# Patient Record
Sex: Male | Born: 1955 | Race: Black or African American | Hispanic: No | Marital: Single | Smoking: Former smoker
Health system: Southern US, Community
[De-identification: ages and names within clinical notes are randomized; demographics above are authoritative.]

## PROBLEM LIST (undated history)

## (undated) DIAGNOSIS — E785 Hyperlipidemia, unspecified: Secondary | ICD-10-CM

## (undated) DIAGNOSIS — Z982 Presence of cerebrospinal fluid drainage device: Secondary | ICD-10-CM

## (undated) DIAGNOSIS — J310 Chronic rhinitis: Secondary | ICD-10-CM

## (undated) DIAGNOSIS — R1310 Dysphagia, unspecified: Secondary | ICD-10-CM

## (undated) DIAGNOSIS — I639 Cerebral infarction, unspecified: Secondary | ICD-10-CM

## (undated) DIAGNOSIS — E039 Hypothyroidism, unspecified: Secondary | ICD-10-CM

## (undated) DIAGNOSIS — E46 Unspecified protein-calorie malnutrition: Secondary | ICD-10-CM

## (undated) DIAGNOSIS — Z8739 Personal history of other diseases of the musculoskeletal system and connective tissue: Secondary | ICD-10-CM

## (undated) DIAGNOSIS — I629 Nontraumatic intracranial hemorrhage, unspecified: Secondary | ICD-10-CM

## (undated) HISTORY — PX: VENTRICULOPERITONEAL SHUNT: SHX204

---

## 1994-05-13 DIAGNOSIS — I629 Nontraumatic intracranial hemorrhage, unspecified: Secondary | ICD-10-CM

## 1994-05-13 HISTORY — DX: Nontraumatic intracranial hemorrhage, unspecified: I62.9

## 2006-01-02 ENCOUNTER — Emergency Department (HOSPITAL_COMMUNITY): Admission: EM | Admit: 2006-01-02 | Discharge: 2006-01-02 | Payer: Self-pay | Admitting: Family Medicine

## 2006-01-09 ENCOUNTER — Ambulatory Visit: Payer: Self-pay | Admitting: Family Medicine

## 2006-06-21 ENCOUNTER — Emergency Department (HOSPITAL_COMMUNITY): Admission: EM | Admit: 2006-06-21 | Discharge: 2006-06-21 | Payer: Self-pay | Admitting: Family Medicine

## 2007-10-16 ENCOUNTER — Encounter: Admission: RE | Admit: 2007-10-16 | Discharge: 2007-10-16 | Payer: Self-pay | Admitting: Family Medicine

## 2009-12-12 ENCOUNTER — Encounter: Admission: RE | Admit: 2009-12-12 | Discharge: 2009-12-12 | Payer: Self-pay | Admitting: Neurology

## 2010-01-19 ENCOUNTER — Inpatient Hospital Stay (HOSPITAL_COMMUNITY): Admission: RE | Admit: 2010-01-19 | Discharge: 2010-01-21 | Payer: Self-pay | Admitting: Neurosurgery

## 2010-02-16 ENCOUNTER — Encounter: Admission: RE | Admit: 2010-02-16 | Discharge: 2010-02-16 | Payer: Self-pay | Admitting: Neurosurgery

## 2010-03-01 ENCOUNTER — Encounter
Admission: RE | Admit: 2010-03-01 | Discharge: 2010-05-02 | Payer: Self-pay | Source: Home / Self Care | Attending: Neurosurgery | Admitting: Neurosurgery

## 2010-05-21 ENCOUNTER — Encounter
Admission: RE | Admit: 2010-05-21 | Discharge: 2010-06-12 | Payer: Self-pay | Source: Home / Self Care | Attending: Neurosurgery | Admitting: Neurosurgery

## 2010-07-26 LAB — SURGICAL PCR SCREEN
MRSA, PCR: NEGATIVE
Staphylococcus aureus: NEGATIVE

## 2010-07-26 LAB — BASIC METABOLIC PANEL
CO2: 29 mEq/L (ref 19–32)
Calcium: 9.9 mg/dL (ref 8.4–10.5)
GFR calc Af Amer: >60 mL/min (ref >60)
GFR calc non Af Amer: >60 mL/min (ref >60)
Potassium: 4.8 mEq/L (ref 3.5–5.1)
Sodium: 139 mEq/L (ref 135–145)

## 2010-07-26 LAB — CBC
Hemoglobin: 11.7 g/dL — ABNORMAL LOW (ref 13.0–17.0)
RBC: 3.95 MIL/uL — ABNORMAL LOW (ref 4.22–5.81)

## 2010-09-11 ENCOUNTER — Emergency Department (HOSPITAL_COMMUNITY): Payer: Medicaid Other

## 2010-09-11 ENCOUNTER — Inpatient Hospital Stay (HOSPITAL_COMMUNITY)
Admission: EM | Admit: 2010-09-11 | Discharge: 2010-09-18 | DRG: 177 | Disposition: A | Discharge status: Rehabilitation Facility | Payer: Medicaid Other | Source: Ambulatory Visit | Attending: Internal Medicine | Admitting: Internal Medicine

## 2010-09-11 DIAGNOSIS — E039 Hypothyroidism, unspecified: Secondary | ICD-10-CM | POA: Diagnosis present

## 2010-09-11 DIAGNOSIS — Z880 Allergy status to penicillin: Secondary | ICD-10-CM

## 2010-09-11 DIAGNOSIS — Z87891 Personal history of nicotine dependence: Secondary | ICD-10-CM

## 2010-09-11 DIAGNOSIS — I509 Heart failure, unspecified: Secondary | ICD-10-CM | POA: Diagnosis present

## 2010-09-11 DIAGNOSIS — G8929 Other chronic pain: Secondary | ICD-10-CM | POA: Diagnosis present

## 2010-09-11 DIAGNOSIS — R578 Other shock: Secondary | ICD-10-CM | POA: Diagnosis present

## 2010-09-11 DIAGNOSIS — Z982 Presence of cerebrospinal fluid drainage device: Secondary | ICD-10-CM

## 2010-09-11 DIAGNOSIS — F1410 Cocaine abuse, uncomplicated: Secondary | ICD-10-CM | POA: Diagnosis present

## 2010-09-11 DIAGNOSIS — D649 Anemia, unspecified: Secondary | ICD-10-CM | POA: Diagnosis present

## 2010-09-11 DIAGNOSIS — N179 Acute kidney failure, unspecified: Secondary | ICD-10-CM | POA: Diagnosis present

## 2010-09-11 DIAGNOSIS — A088 Other specified intestinal infections: Secondary | ICD-10-CM | POA: Diagnosis present

## 2010-09-11 DIAGNOSIS — R748 Abnormal levels of other serum enzymes: Secondary | ICD-10-CM | POA: Diagnosis present

## 2010-09-11 DIAGNOSIS — E43 Unspecified severe protein-calorie malnutrition: Secondary | ICD-10-CM | POA: Diagnosis present

## 2010-09-11 DIAGNOSIS — R5381 Other malaise: Secondary | ICD-10-CM | POA: Diagnosis present

## 2010-09-11 DIAGNOSIS — I5033 Acute on chronic diastolic (congestive) heart failure: Secondary | ICD-10-CM | POA: Diagnosis not present

## 2010-09-11 DIAGNOSIS — J690 Pneumonitis due to inhalation of food and vomit: Principal | ICD-10-CM | POA: Diagnosis present

## 2010-09-11 DIAGNOSIS — I69959 Hemiplegia and hemiparesis following unspecified cerebrovascular disease affecting unspecified side: Secondary | ICD-10-CM

## 2010-09-11 HISTORY — PX: PEG TUBE PLACEMENT: SUR1034

## 2010-09-11 LAB — RAPID URINE DRUG SCREEN, HOSP PERFORMED
Amphetamines: NOT DETECTED
Cocaine: NOT DETECTED
Opiates: NOT DETECTED
Tetrahydrocannabinol: NOT DETECTED

## 2010-09-11 LAB — URINALYSIS, ROUTINE W REFLEX MICROSCOPIC
Glucose, UA: NEGATIVE mg/dL
Ketones, ur: NEGATIVE mg/dL
Leukocytes, UA: NEGATIVE
Nitrite: NEGATIVE
Protein, ur: 100 mg/dL — AB
pH: 5 (ref 5.0–8.0)

## 2010-09-11 LAB — COMPREHENSIVE METABOLIC PANEL
Albumin: 3.5 g/dL (ref 3.5–5.2)
Alkaline Phosphatase: 142 U/L — ABNORMAL HIGH (ref 39–117)
BUN: 34 mg/dL — ABNORMAL HIGH (ref 6–23)
CO2: 18 mEq/L — ABNORMAL LOW (ref 19–32)
Chloride: 101 mEq/L (ref 96–112)
Creatinine, Ser: 3.43 mg/dL — ABNORMAL HIGH (ref 0.4–1.5)
GFR calc Af Amer: 23 mL/min — ABNORMAL LOW (ref >60)
Glucose, Bld: 104 mg/dL — ABNORMAL HIGH (ref 70–99)
Sodium: 138 mEq/L (ref 135–145)
Total Bilirubin: 1.6 mg/dL — ABNORMAL HIGH (ref 0.3–1.2)
Total Protein: 8.2 g/dL (ref 6.0–8.3)

## 2010-09-11 LAB — TROPONIN I: Troponin I: <0.3 ng/mL (ref <0.30)

## 2010-09-11 LAB — URINE MICROSCOPIC-ADD ON

## 2010-09-11 LAB — CBC
Hemoglobin: 11.9 g/dL — ABNORMAL LOW (ref 13.0–17.0)
MCHC: 34.8 g/dL (ref 30.0–36.0)
RBC: 4.03 MIL/uL — ABNORMAL LOW (ref 4.22–5.81)
WBC: 14.4 10*3/uL — ABNORMAL HIGH (ref 4.0–10.5)

## 2010-09-11 LAB — LIPASE, BLOOD: Lipase: 20 U/L (ref 11–59)

## 2010-09-11 LAB — DIFFERENTIAL
Basophils Absolute: 0.1 10*3/uL (ref 0.0–0.1)
Eosinophils Absolute: 0.3 10*3/uL (ref 0.0–0.7)
Lymphocytes Relative: 10 % — ABNORMAL LOW (ref 12–46)
Neutrophils Relative %: 80 % — ABNORMAL HIGH (ref 43–77)
WBC Morphology: INCREASED

## 2010-09-11 LAB — GLUCOSE, CAPILLARY: Glucose-Capillary: 77 mg/dL (ref 70–99)

## 2010-09-11 LAB — PROCALCITONIN: Procalcitonin: 85.83 ng/mL

## 2010-09-12 ENCOUNTER — Inpatient Hospital Stay (HOSPITAL_COMMUNITY): Payer: Medicaid Other

## 2010-09-12 ENCOUNTER — Other Ambulatory Visit (HOSPITAL_COMMUNITY): Payer: Medicaid Other

## 2010-09-12 DIAGNOSIS — I10 Essential (primary) hypertension: Secondary | ICD-10-CM

## 2010-09-12 DIAGNOSIS — R7989 Other specified abnormal findings of blood chemistry: Secondary | ICD-10-CM

## 2010-09-12 DIAGNOSIS — I059 Rheumatic mitral valve disease, unspecified: Secondary | ICD-10-CM

## 2010-09-12 LAB — HEPATIC FUNCTION PANEL
Albumin: 2.8 g/dL — ABNORMAL LOW (ref 3.5–5.2)
Alkaline Phosphatase: 117 U/L (ref 39–117)
Indirect Bilirubin: 0.5 mg/dL (ref 0.3–0.9)
Total Protein: 6.6 g/dL (ref 6.0–8.3)

## 2010-09-12 LAB — BLOOD GAS, ARTERIAL
Bicarbonate: 18.4 mEq/L — ABNORMAL LOW (ref 20.0–24.0)
Bicarbonate: 18.7 mEq/L — ABNORMAL LOW (ref 20.0–24.0)
FIO2: 0.21 %
O2 Saturation: 95.4 %
Patient temperature: 98.6
Patient temperature: 102.3
TCO2: 19.4 mmol/L (ref 0–100)
pCO2 arterial: 32.8 mmHg — ABNORMAL LOW (ref 35.0–45.0)
pH, Arterial: 7.385 (ref 7.350–7.450)
pO2, Arterial: 82.7 mmHg (ref 80.0–100.0)
pO2, Arterial: 65.3 mmHg — ABNORMAL LOW (ref 80.0–100.0)

## 2010-09-12 LAB — BASIC METABOLIC PANEL
CO2: 19 mEq/L (ref 19–32)
Chloride: 111 mEq/L (ref 96–112)
GFR calc Af Amer: 41 mL/min — ABNORMAL LOW (ref >60)
Potassium: 3.9 mEq/L (ref 3.5–5.1)

## 2010-09-12 LAB — CARDIAC PANEL(CRET KIN+CKTOT+MB+TROPI)
Relative Index: 0.9 (ref 0.0–2.5)
Total CK: 1359 U/L — ABNORMAL HIGH (ref 7–232)
Troponin I: 0.35 ng/mL (ref <0.30)

## 2010-09-12 LAB — CBC
HCT: 28.5 % — ABNORMAL LOW (ref 39.0–52.0)
Hemoglobin: 9.8 g/dL — ABNORMAL LOW (ref 13.0–17.0)
MCH: 29 pg (ref 26.0–34.0)
MCHC: 34.4 g/dL (ref 30.0–36.0)
RBC: 3.38 MIL/uL — ABNORMAL LOW (ref 4.22–5.81)

## 2010-09-12 LAB — INFLUENZA PANEL BY PCR (TYPE A & B)
H1N1 flu by pcr: NOT DETECTED
Influenza A By PCR: NEGATIVE

## 2010-09-12 LAB — GLUCOSE, CAPILLARY
Glucose-Capillary: 95 mg/dL (ref 70–99)
Glucose-Capillary: 88 mg/dL (ref 70–99)
Glucose-Capillary: 76 mg/dL (ref 70–99)
Glucose-Capillary: 81 mg/dL (ref 70–99)
Glucose-Capillary: 77 mg/dL (ref 70–99)

## 2010-09-12 LAB — CLOSTRIDIUM DIFFICILE BY PCR: Toxigenic C. Difficile by PCR: NEGATIVE

## 2010-09-12 LAB — STREP PNEUMONIAE URINARY ANTIGEN: Strep Pneumo Urinary Antigen: NEGATIVE

## 2010-09-12 LAB — HEPATITIS PANEL, ACUTE
HCV Ab: NEGATIVE
Hep A IgM: NEGATIVE
Hepatitis B Surface Ag: NEGATIVE

## 2010-09-12 LAB — PRO B NATRIURETIC PEPTIDE: Pro B Natriuretic peptide (BNP): 2090 pg/mL — ABNORMAL HIGH (ref 0–125)

## 2010-09-12 LAB — LACTATE DEHYDROGENASE: LDH: 204 U/L (ref 94–250)

## 2010-09-12 LAB — MAGNESIUM: Magnesium: 2.4 mg/dL (ref 1.5–2.5)

## 2010-09-12 LAB — URINE CULTURE: Culture: NO GROWTH

## 2010-09-12 LAB — LACTIC ACID, PLASMA: Lactic Acid, Venous: 1.1 mmol/L (ref 0.5–2.2)

## 2010-09-12 LAB — GIARDIA/CRYPTOSPORIDIUM SCREEN(EIA): Cryptosporidium Screen (EIA): NEGATIVE

## 2010-09-12 LAB — HIV ANTIBODY (ROUTINE TESTING W REFLEX): HIV: NONREACTIVE

## 2010-09-12 LAB — LEGIONELLA ANTIGEN, URINE

## 2010-09-13 DIAGNOSIS — I509 Heart failure, unspecified: Secondary | ICD-10-CM

## 2010-09-13 LAB — GLUCOSE, CAPILLARY

## 2010-09-13 LAB — COMPREHENSIVE METABOLIC PANEL
ALT: 50 U/L (ref 0–53)
AST: 43 U/L — ABNORMAL HIGH (ref 0–37)
Albumin: 2.5 g/dL — ABNORMAL LOW (ref 3.5–5.2)
Chloride: 110 mEq/L (ref 96–112)
Creatinine, Ser: 1.3 mg/dL (ref 0.4–1.5)
GFR calc Af Amer: >60 mL/min (ref >60)
Potassium: 3.8 mEq/L (ref 3.5–5.1)
Sodium: 140 mEq/L (ref 135–145)
Total Bilirubin: 0.3 mg/dL (ref 0.3–1.2)

## 2010-09-13 LAB — CBC
Hemoglobin: 9.1 g/dL — ABNORMAL LOW (ref 13.0–17.0)
MCH: 28.8 pg (ref 26.0–34.0)
Platelets: 292 10*3/uL (ref 150–400)
RBC: 3.16 MIL/uL — ABNORMAL LOW (ref 4.22–5.81)
WBC: 16.2 10*3/uL — ABNORMAL HIGH (ref 4.0–10.5)

## 2010-09-14 LAB — DIFFERENTIAL
Eosinophils Relative: 5 % (ref 0–5)
Lymphocytes Relative: 13 % (ref 12–46)
Lymphs Abs: 1.7 10*3/uL (ref 0.7–4.0)
Neutrophils Relative %: 68 % (ref 43–77)

## 2010-09-14 LAB — COMPREHENSIVE METABOLIC PANEL
ALT: 46 U/L (ref 0–53)
Alkaline Phosphatase: 204 U/L — ABNORMAL HIGH (ref 39–117)
CO2: 25 mEq/L (ref 19–32)
GFR calc non Af Amer: >60 mL/min (ref >60)
Glucose, Bld: 82 mg/dL (ref 70–99)
Potassium: 3.3 mEq/L — ABNORMAL LOW (ref 3.5–5.1)
Sodium: 141 mEq/L (ref 135–145)

## 2010-09-14 LAB — CBC
HCT: 27.6 % — ABNORMAL LOW (ref 39.0–52.0)
MCV: 84.4 fL (ref 78.0–100.0)
RBC: 3.27 MIL/uL — ABNORMAL LOW (ref 4.22–5.81)
WBC: 13.1 10*3/uL — ABNORMAL HIGH (ref 4.0–10.5)

## 2010-09-15 LAB — GLUCOSE, CAPILLARY: Glucose-Capillary: 75 mg/dL (ref 70–99)

## 2010-09-15 LAB — STOOL CULTURE

## 2010-09-16 LAB — GLUCOSE, CAPILLARY
Glucose-Capillary: 85 mg/dL (ref 70–99)
Glucose-Capillary: 83 mg/dL (ref 70–99)

## 2010-09-16 LAB — BASIC METABOLIC PANEL
BUN: 12 mg/dL (ref 6–23)
CO2: 29 mEq/L (ref 19–32)
Chloride: 100 mEq/L (ref 96–112)
Creatinine, Ser: 0.88 mg/dL (ref 0.4–1.5)
GFR calc Af Amer: >60 mL/min (ref >60)

## 2010-09-17 DIAGNOSIS — J159 Unspecified bacterial pneumonia: Secondary | ICD-10-CM

## 2010-09-17 DIAGNOSIS — R5381 Other malaise: Secondary | ICD-10-CM

## 2010-09-17 DIAGNOSIS — R1310 Dysphagia, unspecified: Secondary | ICD-10-CM

## 2010-09-18 ENCOUNTER — Inpatient Hospital Stay (HOSPITAL_COMMUNITY): Payer: Medicaid Other

## 2010-09-18 ENCOUNTER — Inpatient Hospital Stay (HOSPITAL_COMMUNITY)
Admission: RE | Admit: 2010-09-18 | Discharge: 2010-10-02 | DRG: 945 | Disposition: A | Discharge status: Home Health | Payer: Medicaid Other | Source: Other Acute Inpatient Hospital | Attending: Physical Medicine & Rehabilitation | Admitting: Physical Medicine & Rehabilitation

## 2010-09-18 DIAGNOSIS — E8779 Other fluid overload: Secondary | ICD-10-CM

## 2010-09-18 DIAGNOSIS — R5381 Other malaise: Secondary | ICD-10-CM

## 2010-09-18 DIAGNOSIS — G8929 Other chronic pain: Secondary | ICD-10-CM

## 2010-09-18 DIAGNOSIS — Z982 Presence of cerebrospinal fluid drainage device: Secondary | ICD-10-CM

## 2010-09-18 DIAGNOSIS — E440 Moderate protein-calorie malnutrition: Secondary | ICD-10-CM

## 2010-09-18 DIAGNOSIS — I69959 Hemiplegia and hemiparesis following unspecified cerebrovascular disease affecting unspecified side: Secondary | ICD-10-CM

## 2010-09-18 DIAGNOSIS — J159 Unspecified bacterial pneumonia: Secondary | ICD-10-CM

## 2010-09-18 DIAGNOSIS — N179 Acute kidney failure, unspecified: Secondary | ICD-10-CM

## 2010-09-18 DIAGNOSIS — E039 Hypothyroidism, unspecified: Secondary | ICD-10-CM

## 2010-09-18 DIAGNOSIS — A419 Sepsis, unspecified organism: Secondary | ICD-10-CM

## 2010-09-18 DIAGNOSIS — J690 Pneumonitis due to inhalation of food and vomit: Secondary | ICD-10-CM

## 2010-09-18 DIAGNOSIS — Z880 Allergy status to penicillin: Secondary | ICD-10-CM

## 2010-09-18 DIAGNOSIS — R1310 Dysphagia, unspecified: Secondary | ICD-10-CM

## 2010-09-18 DIAGNOSIS — G4700 Insomnia, unspecified: Secondary | ICD-10-CM

## 2010-09-18 DIAGNOSIS — E162 Hypoglycemia, unspecified: Secondary | ICD-10-CM

## 2010-09-18 DIAGNOSIS — Z5189 Encounter for other specified aftercare: Principal | ICD-10-CM

## 2010-09-18 DIAGNOSIS — Z681 Body mass index (BMI) 19 or less, adult: Secondary | ICD-10-CM

## 2010-09-18 DIAGNOSIS — Z833 Family history of diabetes mellitus: Secondary | ICD-10-CM

## 2010-09-18 LAB — CBC
HCT: 34 % — ABNORMAL LOW (ref 39.0–52.0)
MCV: 85.2 fL (ref 78.0–100.0)
RBC: 3.99 MIL/uL — ABNORMAL LOW (ref 4.22–5.81)
WBC: 13.3 10*3/uL — ABNORMAL HIGH (ref 4.0–10.5)

## 2010-09-18 LAB — CULTURE, BLOOD (ROUTINE X 2)
Culture  Setup Time: 201205020552
Culture: NO GROWTH

## 2010-09-18 LAB — GLUCOSE, CAPILLARY: Glucose-Capillary: 92 mg/dL (ref 70–99)

## 2010-09-19 DIAGNOSIS — R5381 Other malaise: Secondary | ICD-10-CM

## 2010-09-19 DIAGNOSIS — J159 Unspecified bacterial pneumonia: Secondary | ICD-10-CM

## 2010-09-19 LAB — DIFFERENTIAL
Basophils Absolute: 0.1 10*3/uL (ref 0.0–0.1)
Basophils Relative: 0 % (ref 0–1)
Neutro Abs: 8.2 10*3/uL — ABNORMAL HIGH (ref 1.7–7.7)
Neutrophils Relative %: 71 % (ref 43–77)

## 2010-09-19 LAB — CBC
Hemoglobin: 11 g/dL — ABNORMAL LOW (ref 13.0–17.0)
RBC: 3.79 MIL/uL — ABNORMAL LOW (ref 4.22–5.81)

## 2010-09-19 LAB — COMPREHENSIVE METABOLIC PANEL
ALT: 23 U/L (ref 0–53)
AST: 23 U/L (ref 0–37)
CO2: 31 mEq/L (ref 19–32)
Calcium: 9.7 mg/dL (ref 8.4–10.5)
Chloride: 97 mEq/L (ref 96–112)
GFR calc Af Amer: >60 mL/min (ref >60)
GFR calc non Af Amer: >60 mL/min (ref >60)
Sodium: 136 mEq/L (ref 135–145)

## 2010-09-21 ENCOUNTER — Inpatient Hospital Stay (HOSPITAL_COMMUNITY): Payer: Medicaid Other

## 2010-09-21 DIAGNOSIS — R5381 Other malaise: Secondary | ICD-10-CM

## 2010-09-21 DIAGNOSIS — I69919 Unspecified symptoms and signs involving cognitive functions following unspecified cerebrovascular disease: Secondary | ICD-10-CM

## 2010-09-21 DIAGNOSIS — J159 Unspecified bacterial pneumonia: Secondary | ICD-10-CM

## 2010-09-21 DIAGNOSIS — I69991 Dysphagia following unspecified cerebrovascular disease: Secondary | ICD-10-CM

## 2010-09-23 ENCOUNTER — Inpatient Hospital Stay (HOSPITAL_COMMUNITY): Payer: Medicaid Other

## 2010-09-23 LAB — GLUCOSE, CAPILLARY: Glucose-Capillary: 79 mg/dL (ref 70–99)

## 2010-09-23 NOTE — H&P (Signed)
[name], [name]               ACCOUNT NO.:  [id number]  MEDICAL RECORD NO.:  [id number]           PATIENT TYPE:  E  LOCATION:  [facility]                         FACILITY:  [facility]  PHYSICIAN:  [name], MDDATE OF BIRTH:  09-14-1955  DATE OF ADMISSION:  09/11/2010 DATE OF DISCHARGE:                             HISTORY & PHYSICAL   PRIMARY CARE PHYSICIAN:  Roy D. Pecola Leisure, MD  CHIEF COMPLAINT:  Nausea, vomiting, and diarrhea.  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  Initially, the patient came to the ER, the patient was having a blood pressure of 80/60, tachycardic.  The patient has received almost 2 liters of fluid.  At this time, blood pressure is around 110 systolic.  The patient is symptomatic and been admitted for further workup.  As per the patient's aunt who provided history, the patient has not been doing well since Saturday.  He goes to enrichment center due to his disability on weekdays.  On Friday, he had one of his mates who has nausea, vomiting and diarrhea.  The patient started feeling unwell from Saturday.  He became more lethargic and stumbling.  On Sunday, he had multiple episode of nausea, vomiting and diarrhea.  On Monday morning, he was found on the floor in the bathroom by his aunt.  At that time, they had called the primary care physician who advised to get Kaopectate for his diarrhea which may be causing the problem.  By the evening, he started having cough with productive sputum and  fever or chills and today morning, his condition further worsened and the family decided to bring to the ER.  At this time, a chest x-ray does show infiltrates in the right lower and upper lobe concerning for aspiration.  The patient has been admitted for pneumonia and acute renal failure and nausea, vomiting, and diarrhea probably from gastroenteritis.  The patient denies any chest pain or shortness of breath at this time. Denies any headache or visual symptoms.  Denies any new focal deficit. Denies any abdominal pain, dysuria, or discharges.  He has had multiple episode of diarrhea in the ER, which is looking greenish in color.  PAST MEDICAL HISTORY: 1. History of intracranial hemorrhage in 9096 after using cocaine,     which left the patient with left-sided hemiparesis and partial left-     sided blindness and left facial asymmetry, had required VP shunt     which was revised last year due to malfunction. 2. History of hypothyroidism. 3. History of polysubstance abuse including cigarette smoking,     drinking and cocaine and heroin abuse which he has stopped for     years ago as per the patient's aunt.  Medications prior to admission which has to be verified includes levothyroxine, meloxicam, and trazodone.  ALLERGIES:  IBUPROFEN, MOTRIN and PENICILLIN.  FAMILY HISTORY:  Nothing contributory.  SOCIAL HISTORY:  The patient used to smoke cigarette, drinking alcohol and abuse cocaine and heroin which he has stopped using for 5 years as per family.  REVIEW OF SYSTEMS:  As per the history of presenting illness, nothing else significant.  PHYSICAL EXAMINATION:  GENERAL:  The patient examined at the bedside not in acute distress. VITAL SIGNS:  Blood pressure 110/60, pulse 90 per minute, temperature 98.1, respirations 18 per minute, O2 sat 100%. HEENT:  Anicteric.  There is mild left-sided asymmetry, which family says it is chronic.  The patient at this time is able to see in  both eyes, though he has history of left-sided partial blindness.  No pallor. NECK:  No neck rigidity.  No discharge from ears, eyes, nose, or mouth. CHEST:  Bilateral entry present.  No rhonchi.  No crepitation. HEART:  S1-S2 heard. ABDOMEN:  Soft, nontender.  Bowel sounds heard. CNS:  The patient is alert, awake, oriented to time, place, and person. He is able to move upper and lower extremities.  His left upper extremity is around 3/5 strength.  His left lower extremity is almost normal strength at this time.  Right upper and lower extremity he is able to move normally. EXTREMITIES:  Peripheral pulses felt.  No acute ischemic changes, cyanosis, or clubbing.  I do not see any hematoma or skin discoloration at this time.  LABORATORY STUDIES:  EKG shows normal sinus rhythm with sinus tachycardia, beats around 110 beats per minute with T-wave inversion which is also present on the old EKG.  Acute abdominal series shows right upper lobe and lower lung pneumonia.  This may represent aspiration pneumonia given the clinical history of vomiting.  The VP shunt catheter achieving appears grossly intact.  Soft tissue x-ray shows there is some redundant shunt tubing in the neck potentially related to prior revision of VP shunt.  CT of head without contrast shows no acute intracranial abnormalities, stable ventriculomegaly and ventriculostomy position, old right basal ganglia and lacunar, and right frontal lobe white matter infarct.  CBC; WBC is 14.4, hemoglobin is 11.9, hematocrit is 34.2, platelets 408, neutrophils 80%.  Complete metabolic panel; sodium 138, potassium 4.4, chloride 101, carbon dioxide is 18, anion gap is 19, glucose 104, BUN 34, creatinine 3.4, total bilirubin 1.6, alk phos 142, AST 87, ALT 91, albumin 3.5, calcium 10.5. UA appears amber cloudy, negative for nitrite leukocyte shows granular and hyaline cast.  Cultures pending.  ASSESSMENT: 1. Nausea, vomiting, and diarrhea  probably from gastroenteritis. 2. Acute renal failure from dehydration. 3. Pneumonia, possible aspiration. 4. History of intracranial hemorrhage and stroke, status post VP shunt     placement with left-sided hemiparesis. 5. History of hypothyroidism. 6. History of polysubstance abuse. 7. Anemia.  PLAN: 1. At this time, I will admit the patient to telemetry. 2. For his nausea, vomiting, and diarrhea with acute renal failure, I     think at this time it is probably from gastroenteritis as the     patient's one of mates in enrichment center had a similar symptoms     last Friday.  We will check stool studies.  We will aggressively     hydrate the patient.  At this time, the patient has responded to     fluids.  His blood pressure has improved to 110-120.  Tachycardia  is decreased.  We will follow strict intake and output, daily     weights and closely follow his creatinine and potassium. 3. For his pneumonia, which at this time, I think it is probably     aspiration.  We will be continuing with clindamycin. 4. For his fevers, we are going to get blood cultures, urine cultures,     and stool studies. 5. Does show mild anemia, we will check anemia profile. 6. There is some septal EKG changes, which are present in the old EKG.     We will just check a cardiac enzyme of this time.  The patient does     not have any chest pain. 7. Mildly elevated LFTs.  We will repeat LFTs again in the morning.     His LFTs may be increase due to his hypotension.  Given his     symptoms, we will check acute hepatitis panel.  We will also check     Tylenol levels.  We are going to check lactic acid and     procalcitonin level and further recommendation as condition     evolves.     Roy Clos, MD     ANK/MEDQ  D:  09/11/2010  T:  09/11/2010  Job:  [id number]  cc:   Jocelyn Lamer D. Pecola [name], M.D.  Electronically Signed by Midge Minium MD on 09/23/2010 08:36:53 AM

## 2010-09-24 ENCOUNTER — Inpatient Hospital Stay (HOSPITAL_COMMUNITY): Payer: Medicaid Other

## 2010-09-24 DIAGNOSIS — R5381 Other malaise: Secondary | ICD-10-CM

## 2010-09-24 DIAGNOSIS — J159 Unspecified bacterial pneumonia: Secondary | ICD-10-CM

## 2010-09-24 DIAGNOSIS — I69919 Unspecified symptoms and signs involving cognitive functions following unspecified cerebrovascular disease: Secondary | ICD-10-CM

## 2010-09-24 LAB — BASIC METABOLIC PANEL
BUN: 17 mg/dL (ref 6–23)
Chloride: 102 mEq/L (ref 96–112)
Potassium: 4.5 mEq/L (ref 3.5–5.1)
Sodium: 137 mEq/L (ref 135–145)

## 2010-09-24 LAB — GLUCOSE, CAPILLARY
Glucose-Capillary: 94 mg/dL (ref 70–99)
Glucose-Capillary: 54 mg/dL — ABNORMAL LOW (ref 70–99)
Glucose-Capillary: 72 mg/dL (ref 70–99)

## 2010-09-25 LAB — GLUCOSE, CAPILLARY
Glucose-Capillary: 65 mg/dL — ABNORMAL LOW (ref 70–99)
Glucose-Capillary: 67 mg/dL — ABNORMAL LOW (ref 70–99)
Glucose-Capillary: 75 mg/dL (ref 70–99)
Glucose-Capillary: 79 mg/dL (ref 70–99)
Glucose-Capillary: 95 mg/dL (ref 70–99)

## 2010-09-26 ENCOUNTER — Inpatient Hospital Stay (HOSPITAL_COMMUNITY): Payer: Medicaid Other

## 2010-09-26 LAB — GLUCOSE, CAPILLARY
Glucose-Capillary: 101 mg/dL — ABNORMAL HIGH (ref 70–99)
Glucose-Capillary: 104 mg/dL — ABNORMAL HIGH (ref 70–99)
Glucose-Capillary: 70 mg/dL (ref 70–99)
Glucose-Capillary: 85 mg/dL (ref 70–99)
Glucose-Capillary: 86 mg/dL (ref 70–99)

## 2010-09-26 NOTE — Consult Note (Signed)
NAMEGEOVANI, TOOTLE               ACCOUNT NO.:  [id number]  MEDICAL RECORD NO.:  [id number]           PATIENT TYPE:  I  LOCATION:  2610                         FACILITY:  [facility]  PHYSICIAN:  Colleen Can. Deborah Chalk, M.D.DATE OF BIRTH:  [birth year]  DATE OF CONSULTATION:  09/12/2010 DATE OF DISCHARGE:                                CONSULTATION   PRIMARY CARE PHYSICIAN:  Betti D. Pecola Leisure, MD  PRIMARY CARDIOLOGIST:  None.  CHIEF COMPLAINT:  Elevated cardiac enzymes and an elevated BNP.  HISTORY OF PRESENT ILLNESS:  Mr. Donaway is a 55 year old male with no previous cardiac history.  He was admitted last p.m. with nausea, vomiting, and diarrhea.  He had near syncope.  He was noted to have pneumonia, possibly secondary to aspiration and acute renal insufficiency as well as anemia.  His cardiac enzymes were checked and were elevated.  A BNP was elevated as well and his EKG has some new abnormalities on it.  So, Cardiology was asked to evaluate him.  Mr. Rosevear is awake and alert.  He speaks slowly, but is understandable. He answers questions appropriately.  He denies chest pain and states he has never had it.  His shortness of breath is improved.  He complains of lower extremity pain bilaterally secondary to the SCDs, and also complains of hunger.  He has no other complaints at this time.  His activity is limited because of chronic medical problems, but he has no history of exertional symptoms.  PAST MEDICAL HISTORY: 1. History of an intracranial hemorrhage in 1996.  At that time, he     was using cocaine resulting in left hemiparesis. 2. Chronic pain issues. 3. History of polysubstance abuse with heroin, cocaine, alcohol and     tobacco, but none in 5 years. 4. Hypothyroidism. 5. History of anemia.  SURGICAL HISTORY:  He is status post right frontal ventriculoperitoneal shunt with replacement in 2011.  ALLERGIES:  PENICILLIN and IBUPROFEN.  CURRENT MEDICATIONS: 1. Zithromax. 2.  Rocephin. 3. Cleocin. 4. IV fluids.  SOCIAL HISTORY:  He lives in [place] with family.  He is disabled. He quit alcohol, tobacco, and drugs 5 years ago.  FAMILY HISTORY:  There is no coronary artery disease known in either parent or any siblings.  REVIEW OF SYSTEMS:  He is partially blind.  He has chronic left-sided hemiparesis.  He complains of arthralgias and joint pains.  He came in with shortness of breath and presyncope, but this has improved with hydration.  He denies reflux symptoms or melena.  Full 14-point review of systems is otherwise negative except as stated in the HPI.  PHYSICAL EXAMINATION:  VITAL SIGNS:  Temperature is 100.2, blood pressure 105/56, pulse 88, respiratory rate 20, O2 saturation 100% on 4 liters. GENERAL:  He is a slender African American male in no acute distress at rest. HEENT:  His head is normocephalic and atraumatic.  Extraocular movements are intact.  Sclerae are clear.  Nares are without discharge. Oropharynx is without erythema. NECK:  There is no lymphadenopathy, thyromegaly, bruit, or JVD noted. CV:  His heart is regular in rate and rhythm with an S1-S2  and no significant murmur, rub, or gallop is noted.  Distal pulses are 2+ in all four extremities. LUNGS:  He has rales bilaterally, right greater than left. SKIN:  No rashes or lesions are noted. ABDOMEN:  Soft and nontender with active bowel sounds. EXTREMITIES:  There is no cyanosis, clubbing, or edema noted. MUSCULOSKELETAL:  There is no joint deformity or effusions. NEUROLOGIC:  He is alert and oriented with chronic weakness on the left side.  Chest x-ray on Sep 11, 2010 showed pneumonia in the right upper lobe and the right lower lobe, but no edema.  Chest x-ray on Sep 12, 2010 shows worsening right upper lobe with now right middle lobe and right lower lobe airspace disease.  He also has new left medial airspace disease, edema unlikely.  EKG; sinus rhythm, sinus tachycardia,  rate 100 with diffuse inferolateral T-wave changes of unclear significance.  LABORATORY VALUES:  PH 7.377, pCO2 32, pO2 82.7, bicarb 18.4. Hemoglobin 9.8, hematocrit 28.5, WBCs 11.5, platelets 313.  Sodium 141, potassium 3.9, chloride 111, CO2 19, BUN 30, creatinine 2.04, glucose 90.  AST 57, ALT 64, albumin 2.8, other CMET values within normal limits.  BNP 2090, CK-MB 1334/13.6, then 1359/11.7 with a troponin-I of less than 0.3 and then 0.35.  IMPRESSION:  Mr. Depaulo was seen today by Dr. Deborah Chalk, the patient evaluated and the data reviewed.  He is a 55 year old male with no previous cardiac issues who is here with elevated cardiac enzymes and an elevated BNP as well as abnormal EKG.  A 2-D echo has been performed. We suspect that increased cardiac enzymes are related more to sepsis and generalized toxicity.  Preliminary review of the echocardiogram shows a normal left ventricle with an ejection fraction of 55-60%, mild mitral regurgitation and tricuspid regurgitation and a PAS of 30-35 mmHg.  Our recommendation therefore is to treat the underlying pneumonia and illness.  We think the cardiac enzymes and BNP are related to general toxicity and do not indicate a primary cardiac event.     Theodore Demark, PA-C   ______________________________ Colleen Can. Deborah Chalk, M.D.    RB/MEDQ  D:  09/12/2010  T:  09/13/2010  Job:  [id number]  Electronically Signed by Theodore Demark PA-C on 09/20/2010 08:02:17 PM Electronically Signed by Roger Shelter M.D. on 09/26/2010 06:06:10 PM

## 2010-09-27 LAB — GLUCOSE, CAPILLARY
Glucose-Capillary: 82 mg/dL (ref 70–99)
Glucose-Capillary: 86 mg/dL (ref 70–99)
Glucose-Capillary: 90 mg/dL (ref 70–99)

## 2010-09-28 ENCOUNTER — Inpatient Hospital Stay (HOSPITAL_COMMUNITY): Payer: Medicaid Other

## 2010-09-28 LAB — BASIC METABOLIC PANEL
BUN: 15 mg/dL (ref 6–23)
Calcium: 9.4 mg/dL (ref 8.4–10.5)
GFR calc Af Amer: >60 mL/min (ref >60)
GFR calc non Af Amer: >60 mL/min (ref >60)
Glucose, Bld: 79 mg/dL (ref 70–99)
Potassium: 4.5 mEq/L (ref 3.5–5.1)

## 2010-09-28 LAB — CBC
Hemoglobin: 10.2 g/dL — ABNORMAL LOW (ref 13.0–17.0)
MCH: 28.4 pg (ref 26.0–34.0)
RBC: 3.59 MIL/uL — ABNORMAL LOW (ref 4.22–5.81)

## 2010-09-28 LAB — PROTIME-INR: INR: 1.15 (ref 0.00–1.49)

## 2010-09-28 MED ORDER — IOHEXOL 300 MG/ML  SOLN
50.0000 mL | Freq: Once | INTRAMUSCULAR | Status: AC | PRN
  Administered 2010-09-28: 5 mL via INTRAVENOUS

## 2010-09-29 DIAGNOSIS — J159 Unspecified bacterial pneumonia: Secondary | ICD-10-CM

## 2010-09-29 DIAGNOSIS — R5381 Other malaise: Secondary | ICD-10-CM

## 2010-09-29 DIAGNOSIS — I69919 Unspecified symptoms and signs involving cognitive functions following unspecified cerebrovascular disease: Secondary | ICD-10-CM

## 2010-10-01 DIAGNOSIS — J159 Unspecified bacterial pneumonia: Secondary | ICD-10-CM

## 2010-10-01 DIAGNOSIS — R5381 Other malaise: Secondary | ICD-10-CM

## 2010-10-01 DIAGNOSIS — I69919 Unspecified symptoms and signs involving cognitive functions following unspecified cerebrovascular disease: Secondary | ICD-10-CM

## 2010-10-02 LAB — CBC
HCT: 31.3 % — ABNORMAL LOW (ref 39.0–52.0)
MCHC: 33.2 g/dL (ref 30.0–36.0)
Platelets: 380 10*3/uL (ref 150–400)
RDW: 13.7 % (ref 11.5–15.5)
WBC: 6.7 10*3/uL (ref 4.0–10.5)

## 2010-10-02 LAB — BASIC METABOLIC PANEL
Calcium: 9.4 mg/dL (ref 8.4–10.5)
GFR calc Af Amer: >60 mL/min (ref >60)
GFR calc non Af Amer: >60 mL/min (ref >60)
Potassium: 4.9 mEq/L (ref 3.5–5.1)
Sodium: 140 mEq/L (ref 135–145)

## 2010-10-06 NOTE — Discharge Summary (Signed)
[name], [name]               ACCOUNT NO.:  [id number]  MEDICAL RECORD NO.:  [id number]           PATIENT TYPE:  I  LOCATION:  6742                         FACILITY:  [facility]  PHYSICIAN:  Lonia Blood, M.D.       DATE OF BIRTH:  [birth year]  DATE OF ADMISSION:  09/11/2010 DATE OF DISCHARGE:  09/18/2010                              DISCHARGE SUMMARY   DISCHARGE DIAGNOSES: 1. Aspiration pneumonia, pneumonitis. 2. Acute renal failure secondary to nausea, vomiting, diarrhea and     dehydration. 3. Nausea, vomiting, diarrhea resolved/stabilized.  The patient has     one diarrheal bowel movement daily.  This is chronic and ongoing. 4. Elevated cardiac enzymes felt to be secondary to general illness.     No cardiac interventions planned. 5. History of intracranial bleeding/cerebrovascular disease due to     cocaine abuse.  No acute issues. 6. History of hydrocephalus status post VP shunt. No acute issues. 7. Normocytic anemia. 8. Diastolic congestive heart failure with volume overload, resolved. 9. Severe protein calorie malnutrition.  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  He has a VP shunt and had a replacement last year due to malfunction.  He also has a previous history of hypothyroidism, polysubstance, and chronic pain.  He was brought to the ED on May 1 secondary to persistent nausea, vomiting, diarrhea with presyncope.  Initially, the patient came to the ER having a blood pressure of 80/60.  He was tachycardic.  After receiving fluids, his blood pressure stabilized at about 110 systolic.  He was admitted to the hospital for further observation and treatment.  Overnight, his cardiac enzymes returned elevated specifically creatinine kinase 1359, CK-MB 13.6, troponin-I 0.35 and BNP  2090.  He was transferred to the step-down unit.  His aspiration pneumonia was treated with empiric antibiotics including azithromycin, Rocephin and clindamycin after a couple of days as the patient improved, the azithromycin and Rocephin were discontinued and he was maintained on clindamycin for a total of 8 days.  The patient had acute renal failure on admission with a BUN of 34 and creatinine 3.43.  Over the course of this hospitalization with IV fluids, his BUN has dropped to 12 and his creatinine is 0.88.  His acute renal failure has resolved.  His nausea, vomiting and diarrhea was secondary to unclear etiology, could have been simply viral gastroenteritis.  The patient was C. diff negative.  Stool cultures were found to be negative as well.  This improve substantially with hydration.  However, he continues to have 1 episode of diarrhea daily.  He tells me this is his normal bowel habits.  Secondary to his elevated cardiac enzymes, the patient was seen in consultation by Cardiology on Sep 12, 2010, by Dr. Roger Shelter notes that the patient had no previous cardiac issues. His elevated cardiac enzymes and elevated BNP and abnormal EKG were likely related to his sepsis and generalized toxicity.  He had  a 2-D echo during his hospitalization, which demonstrated an ejection fraction of 55-60%, mild mitral regurgitation and tricuspid regurgitation with a PAF of 30-35.  Dr. Lesle Chris recommended that the underlying pneumonia and illness be treated there was no indication for further cardiac workup. The patient continued to improve over the course of hospitalization; however, both the patient and his aunt noted that he was substantially weaker now and he was 6 months ago.  Consequently, a consult for inpatient rehab was requested.  Inpatient rehab evaluated the patient and found that he was a suitable candidate for their services. Consequently, he is being discharged today to inpatient  rehab.  PHYSICAL EXAMINATION:  GENERAL:  At time of discharge, the patient is alert and oriented.  His demeanor is pleasant, cooperative.  He is sitting up in the chair.  He denies any nausea or vomiting today.  He has had one bowel movements. VITAL SIGNS:  Temperature 98.0, pulse 79, respirations 18, blood pressure 105/74. HEENT:  Head is atraumatic normocephalic.  He has a healed scar from his VP shunt. HEART:  Regular rate and rhythm without murmurs, rubs or gallops. ABDOMEN: Soft, nontender, nondistended with good bowel sounds. CHEST:  Decreased sounds of air movement.  However, he has no wheezes, crackles or rales. EXTREMITIES:  Lower extremities demonstrate no edema. PSYCHIATRIC:  The patient is alert and oriented.  His demeanor is pleasant, cooperative.  Grooming is good.  PERTINENT LABORATORY DATA:  During this hospitalization, include his white count which had a peak with 16.2, now at time of discharge is 13.3, hemoglobin at discharge 11.5, hematocrit 34.0, platelets 616.  As mentioned, he had acute renal failure on admission with a BUN of 34, creatinine 3.43.  As of May 6, his BUN and creatinine have normalized at 12 and 0.88 respectively.  Cardiac enzymes peaked out at a CK of 13, 59, CK-MB 13.6 and troponin of 0.35 on Sep 14, 2010.  He had a BNP of 10,917. Acute hepatitis panel was drawn and found to be negative.  HIV was drawn found to be nonreactive.  Urine drug screen was negative.  Stool cultures were negative C. diff PCR was negative.  Urine Legionella antigen was negative.  Blood cultures were drawn and after 5 days there was no growth.  Urine cultures were collected and after 5 days there was no growth.  IMAGING ON ADMISSION:  The patient had a CT of his head that showed no acute intracranial abnormality, stable ventriculomegaly and ventricular colostomy physician.  He has an old right basal ganglia lacunes and right frontal lobe matter infarct.  X-ray of his neck  showed some redundant shunt tubing in the neck potentially related to prior revision of the VP shunt.  X-ray of abdomen showed right upper lobe and lower lung lobe pneumonia.  This may represent aspiration pneumonia given clinical history of vomiting.  Followup portable chest x-ray done May 2 showed worsening bilateral right greater than left airspace disease. Followup x-ray on May 8 is pending.  ANCILLARY STUDIES:  On Sep 12, 2010, the patient had a 2-D echo, which showed an LVEF of 55-60% wall motion was normal.  Mitral valve was found to be mildly thickened.  Otherwise, a 2-D echo was within normal limits. EKG on an Sep 12, 2010.  Indicated T-wave abnormalities, possible inferior ischemia, prolonged QT.  DISCHARGE MEDICATIONS: 1. Acetaminophen 325 mg 1-2 tablets by mouth every 4 hours as needed     for pain. 2. Bisacodyl 10 mg suppository rectally as  needed for constipation. 3. Benadryl 2.5 mg/mL 12.5 to 25 mg by mouth every 6 hours as needed. 4. Guaifenesin D 100 5-10 mL by mouth every 6 hours as needed for     cough. 5. Maalox suspension 30 mL by mouth every 4 hours as needed. 6. Levothyroxine 75 mcg 1 tablet by mouth every morning. 7. Meloxicam 15 mg 1 tablet by mouth q.8 h. as needed for pain. 8. Multivitamin over the counter 1 tablet daily. 9. Trazodone 50 mg 3 tablets by mouth daily at bedtime.  DISCHARGE INSTRUCTIONS:  The patient is to go to inpatient rehab at [facility] and will be followed there.  At the time of discharge, he will follow up with his primary care physician within 2 weeks.     Stephani Police, PA   ______________________________ Lonia Blood, M.D.    MLY/MEDQ  D:  09/18/2010  T:  09/19/2010  Job:  [id number]  Electronically Signed by Algis Downs PA on 10/01/2010 03:37:43 PM Electronically Signed by Lonia Blood M.D. on 10/06/2010 08:16:55 AM

## 2010-10-10 NOTE — Discharge Summary (Signed)
NAMEQUANTA, [name]               ACCOUNT NO.:  [id number]  MEDICAL RECORD NO.:  [id number]           PATIENT TYPE:  I  LOCATION:  4151                         FACILITY:  [facility]  PHYSICIAN:  Erick Colace, M.D.DATE OF BIRTH:  1955-09-03  DATE OF ADMISSION:  09/18/2010 DATE OF DISCHARGE:  10/02/2010                              DISCHARGE SUMMARY   DISCHARGE DIAGNOSES: 1. Deconditioning/pneumonia and history of intracerebral hemorrhage. 2. Dysphagia. 3. Right lower lobe pneumonia, treated. 4. Hypothyroid.  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  KUB and chest x-ray done past admission revealed right upper lobe and right lower lobe pneumonia, likely due to aspiration and due to nausea and vomiting.  UDS negative.  BNP at admission was elevated at 2090.  The patient with hypotension at admission with BP at 86/65 and elevated cardiac enzymes.  Tw-D echo done showed EF of 55-60%.  Cardiology evaluated the patient and felt elevated enzymes due to sepsis.  The patient has been treated with diuretics as well as IV antibiotics.  It was changed to clindamycin on Sep 15, 2010. Speech Therapy evaluated the patient and recommended regular diet as without evidence of aspiration.  Therapy evaluation was done and the patient is noted to have decrease in posture with right limb and increased tone in right lower extremity.  The patient was evaluated by Rehab on Sep 17, 2010, and we felt that he would benefit from [facility] program.  PAST MEDICAL HISTORY:  Significant for hypothyroid, history of polysubstance abuse.  No alcohol or drugs x5 years.  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  REVIEW OF SYMPTOMS:  Positive for weakness as well as decreased vision in left eye.  ALLERGIES: 1. IBUPROFEN. 2. PENICILLIN.  FAMILY HISTORY:  Positive for diabetes mellitus.  SOCIAL HISTORY:  The patient lives with aunt and uncle, in 2-level home with 6 steps at entry.  Bedroom on first floor.  Does not use any alcohol.  Quit tobacco in 2011.  No drugs x5 years.  FUNCTIONAL HISTORY:  The patient was independent with rolling walker prior to admission.  He attends adult day program 4 hours 5 days a week.  FUNCTIONAL STATUS:  The patient is total assist for toileting, setup to supervision for upper body care and lower body care.  Min to mod assist for transfers, min assist ambulating 70 feet with rolling walker.  PHYSICAL EXAMINATION:  VITAL SIGNS:  Blood pressure 128/74, pulse 70, respiratory rate 16, and temperature 98.0. GENERAL:  The patient is pleasant, thin male, alert, no acute distress. HEENT:  Pupils are equal, round, and reactive to light.  Oral mucosa is pink and moist with multiple missing teeth.  Left temporal area with evidence of shunt tubing. NECK:  Supple without JVD or lymphadenopathy. LUNGS:  Noted to have some rales on his right base. HEART:  Regular rate and rhythm without murmurs or gallops. EXTREMITIES:  No evidence of clubbing, cyanosis, or edema. ABDOMEN:  Soft, nontender with positive bowel sounds. SKIN:  No evidence of bruising or breakdown noted. NEUROLOGIC:  Cranial nerves II-XII grossly intact except for some mild left facial weakness and dysarthric speech.  Sensory exam grossly normal.  Reflexes are 1+.  Judgment, orientation, and memory are limited to very basic information.  Mood is extremely flat.  The patient is cooperative and pleasant overall.  Strength is 4-5/5 in bilateral upper extremities.  Strength is 3+/5 to 4/5 proximally to hip with flexion, 4/5 in knees and ankles bilaterally.  No resting tone or  hyperreflexia noted.  HOSPITAL COURSE:  [name] was admitted to Rehab on Sep 18, 2010, for inpatient therapies to consist of PT/OT at least 3 hours 5 days a week.  Past admission. Physiatrist, rehab RN, and therapy team have worked together to provide customized collaborative interdisciplinary care.  The patient was noted to have problems at breakfast and noted to be coughing with evidence of aspiration and therefore speech therapy was added for evaluation and treatment during this stay.  The patient's blood pressure were monitored on a b.i.d. basis and these are noted to be ranging from 90s-110s systolic, 50s-60s diastolic.  The patient was maintained on clindamycin for 14 total days of antibiotic therapy.  Followup chest x-ray was done on Sep 18, 2010, showing significantly improved multilobular right lower lobe pneumonia. Labs done past admission revealed H and H at 11.2 and 32.4, white count of 11.5.  Check of lytes showed low albumin at 3.0.  Speech Therapy evaluated the patient for dysphagia.  His diet was downgraded, exited, and then MBS was done on Sep 21, 2010, revealing primary anatomical pharyngeal dysphagia marked by C5-C6 bony spur-like growth impeding anteriorly into pharyngeal space at the level of UES causing residue with eventual spillage into vestibular and then into the trachea. Giving chronic nature of the patient's bone spurs, the patient was likely able to compensate prior to his illness and deconditioning. N.p.o. status was recommended and Speech Therapy feels the patient will be able to progress to return to p.o. in the future.  On talking to the patient's family members, they relayed the patient having chronic problems with choking frequently with meals as well as problems handling liquids.  PEG tube was recommended for nutritional support.  This was done in Radiology on Sep 28, 2010.  Additionally, followup chest x-ray was done on Sep 22, 2010, showing  further interval improvement in airway opacities with improving right upper lobe and right lower lobe pneumonia.  Most recent CBC of Oct 02, 2010, reveals hemoglobin 10.4, hematocrit 31.3, white count 6.7, and platelets 380.  Lytes at the time of discharge revealed sodium 140, potassium 4.9, chloride 105, CO2 of 29, BUN 14, creatinine 0.75, and glucose 91.  CBGs were checked q.4 h. during this stay.  The patient was noted to have no evidence of hyperglycemia with tube feeds. Blood sugars have been in 50s to 90s range. As blood sugars are overall stable, CBG checks were discontinued.  Currently, the patient's gastrostomy tube site is clean, dry, and intact.  Tube feedings were initiated and the patient was noted to have some issues with diarrhea on Jevity.  He was changed over Vital on the day of discharge.  The family education was done with the patient's aunt regarding tube feeds administration  as well as administration via Panda. Family has been educated regarding strict n.p.o. status.  Speech Therapy has educated the patient's aunt regarding swallow dysfunction, its chronicity and potential for returns.  Outpatient MBS recommended in 2 weeks.  They have also advised family regarding strict oral care and allowing 1 teaspoon of ice chips a day to prevent muscle disuse atrophy. During the patient's stay in the Rehab, weekly team conferences were held to monitor the patient's progress, set goals as well as discuss barriers to discharge.  At admission, the patient was noted to be deconditioned overall with decreased balance and abnormal posture with increased trunk, hip, and knee flexion.  He was at min assist for transfers, min assist for ambulating 60 feet with a rolling walker with postural abnormality and tone when supine.  Physical Therapy has been working with the patient on sitting balance as well as improving truncal extension and upright standing posture.  The patient is currently  at supervision level for transfers.  He is min assist for ambulating short distances.  He is able to navigate 15 stairs with 2 rails with min assist with initial loss of balance, but improved steadiness with ongoing steps.  Family education was done with the patient's aunt with emphasis on need for hands-on assist at all times.  She was also educated regarding the patient's left inattention to peri-space as well as fall risk.  The patient aunt and other family members to assist as needed past discharge.  Occupational Therapy has worked with the patient on self-care tasks.  The patient was noted to have decrease in coordination with decreased postural control and increased premorbid tone that impacted his ability to compensate and complete ADL tasks. Currently, the patient has progressed to be being at supervision level for bathing, upper body and lower body dressing as well as toileting. He is min assist for toilet and shower transfers.  He requires min verbal cues for safety awareness.  24-hour supervision is recommended. The patient's aunt has attended education and has participated in hands- on assistance and supervision that is needed.  Further followup home health PT/OT and speech therapy to continue by [facility] past discharge.  Home health has also been arranged for followup on PEG care and further education on PEG tube feed management.  On Oct 02, 2010, the patient is discharged to home.  DISCHARGE MEDICATIONS: 1. Tylenol 325-650 mg q.4 h. p.r.n. pain. 2. Dulcolax suppository 1 per rectum p.r.n. 3. H2O flushes 100 mL 5 times a day. 4. Vital 1.2 calories 360 mL 5 times a day at 6 a.m., 10 a.m., 2 p.m.,     6 p.m., and 10 p.m. 5. Multivitamin 1 per day. 6. Levothyroxine 75 mcg per day.  ACTIVITY LEVEL:  24-hour supervision.  SPECIAL INSTRUCTIONS:  Nothing by mouth.  No food or no meds by mouth. Do not use trazodone or meloxicam.  Crush all meds and administer  via PEG tube.  Keep area around PEG clean, dry, and intact.  May start showering after Friday.  FOLLOWUP:  The patient is to follow up with Dr. Wynn Banker as needed. Follow up with Dr. Pecola Leisure in 2 weeks.     Delle Reining, P.A.   ______________________________ Erick Colace, M.D.    PL/MEDQ  D:  10/02/2010  T:  10/03/2010  Job:  [id number]  cc:   Jocelyn Lamer D. Pecola Leisure, M.D.  Electronically Signed by Osvaldo Shipper. on 10/09/2010 02:37:58 PM Electronically Signed by Claudette Laws M.D. on 10/10/2010  09:30:11 AM

## 2010-10-25 ENCOUNTER — Other Ambulatory Visit (HOSPITAL_COMMUNITY): Payer: Self-pay | Admitting: Family Medicine

## 2010-10-31 ENCOUNTER — Ambulatory Visit (HOSPITAL_COMMUNITY)
Admission: RE | Admit: 2010-10-31 | Discharge: 2010-10-31 | Disposition: A | Discharge status: Home / Self Care | Payer: Medicaid Other | Source: Ambulatory Visit | Attending: Family Medicine | Admitting: Family Medicine

## 2010-10-31 ENCOUNTER — Ambulatory Visit (HOSPITAL_COMMUNITY)
Admission: RE | Admit: 2010-10-31 | Discharge: 2010-10-31 | Disposition: A | Discharge status: Home / Self Care | Payer: Medicaid Other | Source: Ambulatory Visit | Attending: Neurology | Admitting: Neurology

## 2010-10-31 DIAGNOSIS — R1310 Dysphagia, unspecified: Secondary | ICD-10-CM | POA: Insufficient documentation

## 2010-10-31 NOTE — H&P (Signed)
NAMEAUSTINE, [name]               ACCOUNT NO.:  [id number]  MEDICAL RECORD NO.:  [id number]           PATIENT TYPE:  I  LOCATION:  4151                         FACILITY:  [facility]  PHYSICIAN:  Ranelle Oyster, M.D.DATE OF BIRTH:  23-Dec-1955  DATE OF ADMISSION:  09/18/2010 DATE OF DISCHARGE:                             HISTORY & PHYSICAL   PRIMARY CARE PHYSICIAN:  Betti D. Pecola Leisure, MD.  ATTENDING PHYSICIAN:  Rehab Dr. Claudette Laws.  CHIEF COMPLAINTS:  General weakness and loss of balance.  HISTORY OF PRESENT ILLNESS:  This is a 55 year old African American male with history of intracranial hemorrhage and VP shunt in 1996 with mild residual left hemiparesis and chronic pain.  Admitted on May 1 with nausea, vomiting, diarrhea, and found down in bathroom for by family. This worsened as the day progressed.  Workup revealed right upper lobe pneumonia and right lower lobe pneumonia likely secondary to aspiration, nausea, and vomiting.  UDS revealed a BNP elevated at 2090.  He was hypertensive at admission and had elevated cardiac enzymes.  2-D echo was essentially unremarkable.  Cardiology felt the patient's cardiac enzymes are secondary to sepsis.  Received IV antibiotics, diuretics, and changed to clindamycin p.o. on May 5.  Speech language pathology has been following.  The patient is on regular diet.  He had problems with decreased posture as well as rightward lean and increased tone in the right leg.  I evaluated the patient on May 7 and I felt that he can potentially benefit from an inpatient rehab stay.  REVIEW OF SYSTEMS:  Notable for weakness, decreased vision in his left eye.  Denies nausea or vomiting.  Does have appetite for solid food, which he starting today.  Full 12-point review is in the written H and P.  PAST MEDICAL HISTORY:  Positive for hypothyroidism, history of polysubstance abuse, although he has not had problem here for 5 years. He reports chronic pain,  anemia, and intracranial hemorrhage secondary to cocaine in 1996 with VP shunt, replacement in 2011.  FAMILY HISTORY:  Positive for diabetes.  SOCIAL HISTORY:  The patient lives with his aunt and uncle, two-level home with six steps to enter, bathroom on the first floor.  She had bedroom on the first level.  He does not smoke or drink and has not since 2011.  ALLERGIES:  IBUPROFEN and PENICILLIN.  MEDICATIONS AND LABS:  Please see written H and P.  His notable recent white count is 13.3.  PHYSICAL EXAM:  VITAL SIGNS:  Blood pressure is 128/70, pulse is 20, respiratory rate is 16, temperature is 98. GENERAL:  The patient is pleasant, lying in bed.  No acute distress. HEENT:  Pupils are equal, round, and reactive to light.  Ears, nose, and throat exam notable for missing teeth with pink moist mucosa. NECK:  Supple.  No JVD or lymphadenopathy. RESPIRATORY:  Notable for some rales in the right side, especially right upper lobe, although exam was difficult as the patient was taking short and quick inspirations and making some noises through the mouth and throat. HEART:  Regular rate and rhythm without murmurs, rubs, or gallops.  EXTREMITIES:  No clubbing, cyanosis, or edema. ABDOMEN:  Soft, nontender.  Bowel sounds are positive. SKIN:  Notable for VP shunt over the right temporoparietal region.  No bruising or breakdown seen anywhere in the skin. NEUROLOGIC:  Cranial nerves II through XII are grossly intact, except for some mild left facial weakness and dysarthric speech.  Sensory exam is grossly normal.  Reflexes are 1+.  Judgment, orientation, and memory were very basic.  Mood was extremely flat, but the patient is very cooperative and generally pleasant overall.  Strength is 4/5 to 5/5 in bilateral upper extremities with really no right-to-left differences that I could see.  Strength is 3+/5 to 4/5 proximally to hips with flexion, 4/5 in the knees and ankles bilaterally.  I saw no  resting tone or hyperreflexia.  POST ADMISSION PHYSICIAN EVALUATION: 1. Functional deficit secondary to deconditioning after aspiration     pneumonia and acute renal failure as well as sepsis.  The patient     with history of intracranial hemorrhage and has some baseline     limitations in strength and cognition. 2. The patient is admitted to receive collaborative interdisciplinary     care between the physiatrist, rehab nursing staff, and therapy     team. 3. The patient's level of medical complexity and substantial therapy     needs in context of that medical necessity cannot be provided at     the lesser intensity of care. 4. The patient has experienced substantial functional loss from his     baseline.  Premorbidly, he was independent, attending adult day     care for half a day during the week and independent with his     rolling walker prior to arrival.  Currently, he is total assist for     toileting set up, supervision upper body and lower body care, min     to mod assist for basic transfer, min assist to ambulate 70 feet     with rolling walker.  Judging by the patient's diagnosis, physical     exam, and functional history, the patient has potential to make     functional progress which will result in measurable gains while     inpatient rehab.  These gains will be of substantial and practical     use upon discharge to home in facilitating mobility and self-care.     Interim changes in medical status since preadmission screening are     detailed in history of present illness above. 5. Physiatrist provide 24-hour management of medical needs as well as     oversight of therapy plan/treatment, provide guidance as     appropriate regarding interaction of the two.  Medical problem list     and plan are below. 6. A 24-hour rehab nursing team will assist in the management of the     patient's skin care needs as well as bowel and bladder function,     pain, safety awareness,  integration of therapy concept and     techniques. 7. PT will assess and treat for lower extremity strength, range of     motion, use of adaptive equipment, balance, functional ability with     goals supervision overall. 8. OT will assess and treat for upper extremity use ADLs, adaptive     techniques, equipment, functional mobility, safety awareness.  The     patient and family education with goals of modified independent to     occasional set up/min assist. 9. Speech language pathology  will follow for any cognitive or speech     needs, although, the patient is in his baseline.  Goals here would     be in the supervision level and therapy might be fairly limited     with speech here. 10.Case management and social worker will assess and treat for     psychosocial issues and discharge planning. 11.Team conference will be held weekly to assess progress towards     goals and to determine barriers to discharge. 12.The patient has demonstrated sufficient medical stability and     exercise capacity to tolerate at least 3 hours of therapy per day     at least 5 days per week. 13.Estimated length of stay is approximately 1 week.  Prognosis is     good.  MEDICAL PROBLEM LIST AND PLAN: 1. DVT prophylaxis with gait and sequential compression devices. 2. Hypothyroidism:  Continue thyroid supplementation.  Most recent TSH     is 2.781. 3. Pneumonia:  The patient has persistent right-sided lung sounds.     He is day #8 of antibiotics at this point.  We will check admission     PA and lateral chest x-ray and modify treatment as appropriate.     He has been afebrile.  Denies any shortness of breath. 4. Insomnia:  We will follow up for sleep cycle.  He is on decreased     dose of his nighttime trazodone, which may need to be adjusted with a     backup prn dose as well. 5. Acute renal failure:  This has resolved after treatment of sepsis.     We will check admission BUN and creatinine and follow for  hydration     status, weights, etc. 6. Fluid overload:  He is on low-salt diet.  We will check for fluid     overload as noted in #8. 7. Pain management:  The patient denies significant pain on     examination today.  He will have p.r.n. Tylenol available for now.     He has no resting tone or spasticity on this exam.     Ranelle Oyster, M.D.     ZTS/MEDQ  D:  09/18/2010  T:  09/19/2010  Job:  [id number]  cc:   Jocelyn Lamer D. Pecola Leisure, M.D.  Electronically Signed by Faith Rogue M.D. on 10/31/2010 10:02:08 AM

## 2010-11-26 ENCOUNTER — Other Ambulatory Visit: Payer: Self-pay | Admitting: Neurosurgery

## 2010-11-26 DIAGNOSIS — T85695A Other mechanical complication of other nervous system device, implant or graft, initial encounter: Secondary | ICD-10-CM

## 2010-11-29 ENCOUNTER — Ambulatory Visit
Admission: RE | Admit: 2010-11-29 | Discharge: 2010-11-29 | Disposition: A | Discharge status: Home / Self Care | Payer: Medicaid Other | Source: Ambulatory Visit | Attending: Neurosurgery | Admitting: Neurosurgery

## 2010-11-29 ENCOUNTER — Other Ambulatory Visit: Payer: Self-pay | Admitting: Neurosurgery

## 2010-11-29 DIAGNOSIS — Z982 Presence of cerebrospinal fluid drainage device: Secondary | ICD-10-CM

## 2010-11-29 DIAGNOSIS — T85695A Other mechanical complication of other nervous system device, implant or graft, initial encounter: Secondary | ICD-10-CM

## 2011-01-10 ENCOUNTER — Other Ambulatory Visit: Payer: Self-pay | Admitting: Neurosurgery

## 2011-01-10 DIAGNOSIS — R519 Headache, unspecified: Secondary | ICD-10-CM

## 2011-01-16 ENCOUNTER — Ambulatory Visit
Admission: RE | Admit: 2011-01-16 | Discharge: 2011-01-16 | Disposition: A | Discharge status: Home / Self Care | Payer: Medicaid Other | Source: Ambulatory Visit | Attending: Neurosurgery | Admitting: Neurosurgery

## 2011-02-24 ENCOUNTER — Emergency Department (HOSPITAL_COMMUNITY): Payer: Medicaid Other

## 2011-02-24 ENCOUNTER — Inpatient Hospital Stay (HOSPITAL_COMMUNITY)
Admission: EM | Admit: 2011-02-24 | Discharge: 2011-03-01 | DRG: 640 | Disposition: A | Discharge status: Home Health | Payer: Medicaid Other | Source: Ambulatory Visit | Attending: Internal Medicine | Admitting: Internal Medicine

## 2011-02-24 DIAGNOSIS — M6282 Rhabdomyolysis: Secondary | ICD-10-CM | POA: Diagnosis present

## 2011-02-24 DIAGNOSIS — Z23 Encounter for immunization: Secondary | ICD-10-CM

## 2011-02-24 DIAGNOSIS — E039 Hypothyroidism, unspecified: Secondary | ICD-10-CM | POA: Diagnosis present

## 2011-02-24 DIAGNOSIS — R5381 Other malaise: Secondary | ICD-10-CM | POA: Diagnosis present

## 2011-02-24 DIAGNOSIS — D72819 Decreased white blood cell count, unspecified: Secondary | ICD-10-CM | POA: Diagnosis not present

## 2011-02-24 DIAGNOSIS — G92 Toxic encephalopathy: Secondary | ICD-10-CM | POA: Diagnosis present

## 2011-02-24 DIAGNOSIS — E871 Hypo-osmolality and hyponatremia: Principal | ICD-10-CM | POA: Diagnosis present

## 2011-02-24 DIAGNOSIS — G929 Unspecified toxic encephalopathy: Secondary | ICD-10-CM | POA: Diagnosis present

## 2011-02-24 DIAGNOSIS — R627 Adult failure to thrive: Secondary | ICD-10-CM | POA: Diagnosis present

## 2011-02-24 DIAGNOSIS — E162 Hypoglycemia, unspecified: Secondary | ICD-10-CM | POA: Diagnosis not present

## 2011-02-24 DIAGNOSIS — Z982 Presence of cerebrospinal fluid drainage device: Secondary | ICD-10-CM

## 2011-02-24 DIAGNOSIS — IMO0002 Reserved for concepts with insufficient information to code with codable children: Secondary | ICD-10-CM

## 2011-02-24 DIAGNOSIS — Z931 Gastrostomy status: Secondary | ICD-10-CM

## 2011-02-24 DIAGNOSIS — T3695XA Adverse effect of unspecified systemic antibiotic, initial encounter: Secondary | ICD-10-CM | POA: Diagnosis not present

## 2011-02-24 DIAGNOSIS — Z79899 Other long term (current) drug therapy: Secondary | ICD-10-CM

## 2011-02-24 DIAGNOSIS — E43 Unspecified severe protein-calorie malnutrition: Secondary | ICD-10-CM | POA: Diagnosis present

## 2011-02-24 LAB — URINALYSIS, ROUTINE W REFLEX MICROSCOPIC
Glucose, UA: NEGATIVE mg/dL
Protein, ur: NEGATIVE mg/dL
Specific Gravity, Urine: 1.019 (ref 1.005–1.030)

## 2011-02-24 LAB — URINE MICROSCOPIC-ADD ON

## 2011-02-24 LAB — COMPREHENSIVE METABOLIC PANEL
Albumin: 3.9 g/dL (ref 3.5–5.2)
BUN: 8 mg/dL (ref 6–23)
Calcium: 10.3 mg/dL (ref 8.4–10.5)
Chloride: 86 mEq/L — ABNORMAL LOW (ref 96–112)
Creatinine, Ser: 0.82 mg/dL (ref 0.50–1.35)
Total Bilirubin: 0.4 mg/dL (ref 0.3–1.2)
Total Protein: 7.6 g/dL (ref 6.0–8.3)

## 2011-02-24 LAB — DIFFERENTIAL
Eosinophils Relative: 3 % (ref 0–5)
Lymphocytes Relative: 26 % (ref 12–46)
Lymphs Abs: 1.4 10*3/uL (ref 0.7–4.0)
Monocytes Absolute: 0.3 10*3/uL (ref 0.1–1.0)
Monocytes Relative: 5 % (ref 3–12)

## 2011-02-24 LAB — CBC
HCT: 31.6 % — ABNORMAL LOW (ref 39.0–52.0)
MCH: 29.6 pg (ref 26.0–34.0)
MCHC: 37 g/dL — ABNORMAL HIGH (ref 30.0–36.0)
MCV: 80 fL (ref 78.0–100.0)
RDW: 12.9 % (ref 11.5–15.5)

## 2011-02-24 LAB — PROTIME-INR: INR: 1.21 (ref 0.00–1.49)

## 2011-02-24 LAB — CK TOTAL AND CKMB (NOT AT ARMC)
CK, MB: 9.6 ng/mL (ref 0.3–4.0)
Relative Index: 1.9 (ref 0.0–2.5)

## 2011-02-24 LAB — APTT: aPTT: 46 seconds — ABNORMAL HIGH (ref 24–37)

## 2011-02-24 LAB — PHOSPHORUS: Phosphorus: 3.2 mg/dL (ref 2.3–4.6)

## 2011-02-24 MED ORDER — IOHEXOL 300 MG/ML  SOLN
100.0000 mL | Freq: Once | INTRAMUSCULAR | Status: AC | PRN
  Administered 2011-02-24: 100 mL via INTRAVENOUS

## 2011-02-25 LAB — NA AND K (SODIUM & POTASSIUM), RAND UR: Potassium Urine: 18 mEq/L

## 2011-02-25 LAB — LIPID PANEL
LDL Cholesterol: 58 mg/dL (ref 0–99)
Triglycerides: 36 mg/dL (ref <150)
VLDL: 7 mg/dL (ref 0–40)

## 2011-02-25 LAB — URINE CULTURE: Culture  Setup Time: 201210142111

## 2011-02-25 LAB — BASIC METABOLIC PANEL
BUN: 5 mg/dL — ABNORMAL LOW (ref 6–23)
Chloride: 87 mEq/L — ABNORMAL LOW (ref 96–112)
Creatinine, Ser: 0.61 mg/dL (ref 0.50–1.35)
GFR calc Af Amer: >90 mL/min (ref >90)
GFR calc non Af Amer: >90 mL/min (ref >90)

## 2011-02-25 LAB — COMPREHENSIVE METABOLIC PANEL
ALT: 34 U/L (ref 0–53)
AST: 49 U/L — ABNORMAL HIGH (ref 0–37)
Alkaline Phosphatase: 97 U/L (ref 39–117)
CO2: 25 mEq/L (ref 19–32)
Chloride: 90 mEq/L — ABNORMAL LOW (ref 96–112)
Creatinine, Ser: 0.6 mg/dL (ref 0.50–1.35)
GFR calc non Af Amer: >90 mL/min (ref >90)
Potassium: 4 mEq/L (ref 3.5–5.1)
Total Bilirubin: 0.4 mg/dL (ref 0.3–1.2)

## 2011-02-25 LAB — CBC
HCT: 25.7 % — ABNORMAL LOW (ref 39.0–52.0)
MCH: 29.2 pg (ref 26.0–34.0)
MCV: 79.8 fL (ref 78.0–100.0)
Platelets: 256 10*3/uL (ref 150–400)
RBC: 3.22 MIL/uL — ABNORMAL LOW (ref 4.22–5.81)

## 2011-02-25 LAB — TSH: TSH: 2.169 u[IU]/mL (ref 0.350–4.500)

## 2011-02-25 LAB — TROPONIN I
Troponin I: <0.3 ng/mL (ref <0.30)
Troponin I: <0.3 ng/mL (ref <0.30)

## 2011-02-25 LAB — OSMOLALITY, URINE: Osmolality, Ur: 441 mOsm/kg (ref 390–1090)

## 2011-02-25 LAB — CK TOTAL AND CKMB (NOT AT ARMC)
Relative Index: 2 (ref 0.0–2.5)
Total CK: 460 U/L — ABNORMAL HIGH (ref 7–232)

## 2011-02-26 LAB — PROTIME-INR
INR: 1.12 (ref 0.00–1.49)
Prothrombin Time: 14.6 seconds (ref 11.6–15.2)

## 2011-02-26 LAB — GLUCOSE, CAPILLARY: Glucose-Capillary: 106 mg/dL — ABNORMAL HIGH (ref 70–99)

## 2011-02-26 LAB — CBC
HCT: 27.4 % — ABNORMAL LOW (ref 39.0–52.0)
Hemoglobin: 9.8 g/dL — ABNORMAL LOW (ref 13.0–17.0)
MCHC: 35.8 g/dL (ref 30.0–36.0)
RDW: 13.4 % (ref 11.5–15.5)
WBC: 3.5 10*3/uL — ABNORMAL LOW (ref 4.0–10.5)

## 2011-02-26 LAB — BASIC METABOLIC PANEL
BUN: 5 mg/dL — ABNORMAL LOW (ref 6–23)
Chloride: 99 mEq/L (ref 96–112)
Creatinine, Ser: 0.61 mg/dL (ref 0.50–1.35)
GFR calc Af Amer: >90 mL/min (ref >90)
GFR calc non Af Amer: >90 mL/min (ref >90)
Potassium: 4.1 mEq/L (ref 3.5–5.1)

## 2011-02-26 LAB — MAGNESIUM: Magnesium: 2.1 mg/dL (ref 1.5–2.5)

## 2011-02-26 LAB — APTT: aPTT: 40 seconds — ABNORMAL HIGH (ref 24–37)

## 2011-02-26 LAB — PHOSPHORUS: Phosphorus: 2.9 mg/dL (ref 2.3–4.6)

## 2011-02-26 NOTE — H&P (Signed)
NAMEWELLES, WALTHALL               ACCOUNT NO.:  [id number]  MEDICAL RECORD NO.:  [id number]  LOCATION:                                 FACILITY:  PHYSICIAN:  Conley Canal, MD      DATE OF BIRTH:  04-18-1956  DATE OF ADMISSION: DATE OF DISCHARGE:                             HISTORY & PHYSICAL   PRIMARY CARE PHYSICIAN:  Betti D. Pecola Leisure, MD.  Ted McalpineJomarie Longs D. Venetia Maxon, MD.  CHIEF COMPLAINT:  Altered mental status and lethargy for the last few days.  HISTORY OF PRESENT ILLNESS:  Mr. Hubert is a 55 year old male who is under the care of an aunt since he had a VP shunt placed more than 15 years ago and has been dependent on the aunt for activities of daily living since May this year when he was hospitalized for about 2 weeks with aspiration pneumonitis.  At that time, he had a G-tube placed.  The aunt brought him in today because he was not himself.  The history is that about 2 weeks ago he was not feeling too well, and he went to see his primary care provider who placed him on Bactrim, and he did not get much better.  It is not clear why he was started on Bactrim.  Despite the Bactrim, the patient has continued to be lethargic, but notedly about 4 days ago, he had 3 bouts of diarrhea and according to his aunt this has slowed down.  The aunt has noticed some soaking of his clothing when he is lying down.  She mentions yellow colored fluid which is smelly but she has not noticed leakage of the G-tube. However, in the emergency room today some dry blood was noted around the G-tube entry site.  The aunt denies any vomiting, but she says that he has had some chills even up to yesterday.  The patient normally communicates, but apparently he has not communicated much in the last few days.  The aunts notes that he has had a gradual decline health wise since May this year. Today, he was found to be hyponatremic with a sodium of 121.  Otherwise, his other lab tests including urinalysis  and CBC were unremarkable.  A chest x-ray was also unrevealing so was CT of the brain with contrast which did not show any evidence of acute intracranial abnormality, but stable ventriculomegaly and ventriculostomy catheter in place.  He was started on IV fluids and referred to the Hospitalist Service for further management.  At the time of my evaluation, he is mostly somnolent, but arousable.  The aunt denies unusual cough or dysuria.  PAST MEDICAL HISTORY: 1. Hypothyroidism. 2. Deconditioning and failure to thrive. 3. History of intracranial bleeding due to cocaine abuse. 4. Hydrocephalus status post VP shunt. 5. Normocytic anemia. 6. Severe protein calorie malnutrition.  ALLERGIES:  PENICILLIN, IBUPROFEN.  HOME MEDICATIONS:  Include, Synthroid, Septra, Zocor.  FAMILY HISTORY:  Could not be corroborated from patient, but apparently positive for diabetes mellitus per records.  REVIEW OF SYSTEMS:  Unremarkable except as highlighted in the history of present illness.  PHYSICAL EXAMINATION:  GENERAL:  This is a cachectic male, who ismarkedly somnolent, is  not in acute distress. VITAL SIGNS:  His blood pressure at admission was noted to be 96/65 and at the time of evaluation is 116/68, heart rate is 63, temperature 98.6, respirations 16, oxygen saturation is 97% on 2 L nasal cannula. HEAD, EYES, EARS, NOSE, AND THROAT:  Pupils equal, reacting to light. Dry oral mucosa.  Facial asymmetry.  No jugular venous distention.  VP shunt.  Voluntary rigidity of the neck. RESPIRATORY SYSTEM:  Reduced air entry bilaterally with no rhonchi, rales, or wheezes. CARDIOVASCULAR SYSTEM:  First and second heart sounds heard.  No murmurs.  Pulse regular. ABDOMEN:  G-tube in place, soft, no obvious drainage or bleeding at G- tube entry site.  Bowel sounds appreciated.  No palpable organomegaly or masses. CNS:  The patient is nonverbal.  Moves all extremities to painful stimuli, mostly  extension. EXTREMITIES:  Muscle wasting.  No pedal edema.  Peripheral pulses equal.  LABS:  Reviewed.  Significant for WBC 5.4, hemoglobin 11.7, hematocrit 31.6, platelet count 309.  Neutrophils normal.  Sodium 121, baseline sodium is 140 in May this year, potassium is 4.2, BUN 8, creatinine 0.82, calcium 10.3, alkaline phosphatase 126, AST 69, total bilirubin 0.4, albumin 3.9.  Urinalysis shows trace leukocytes.  Chest x-ray and CT was discussed above.  IMPRESSION:  A 55 year old deconditioned male who has a history of intracranial bleed, hydrocephalus status post VP shunt, status post G- tube placement for failure to thrive in May this year, who is coming in with altered mental status with evidence of dehydration.  He is also hyponatremia and he has some history of diarrhea a few days ago after treatment with Bactrim for query urinary tract infection.  He seems to have problems with his G-tube, which are not clear at this point.  The concern would be for just dehydration but apparently his aunt has been feeding him very well, so I would be concerned about possibility of Clostridium difficile colitis given recent antibiotics and possibility of sepsis of an abdominal source.  The hyponatremia may be secondary to dehydration versus Bactrim effect.  PLAN: 1. Altered mental status, dehydration, hyponatremia, possibility of     sepsis.  We will admit the patient to the step-down unit.  Pursue     septic workup including blood cultures, urine culture.  Obtain CT     abdomen and pelvis.  Place the patient on contact isolation.     Meanwhile, cover for sepsis and for possible C. diff with     vancomycin, Levaquin, Flagyl.  Obtain stools C. difficile PCR.     Give IV fluids.  Pursue further workup for hyponatremia. 2. Hypothyroidism, continue Synthroid. 3. Failure to thrive and severe protein calorie malnutrition.     Continue G-tube feeds as the patient was doing at home. 4. DVT  prophylaxis, SCDs.  No anticoagulants as there is question of     abdominal bleeding. 5. GI prophylaxis, PPI. 6. The patient's condition is closely guarded.     Conley Canal, MD     SR/MEDQ  D:  02/24/2011  T:  02/24/2011  Job:  [id number]  cc:   Jocelyn Lamer D. Pecola Leisure, M.D. Danae Orleans. Venetia Maxon, M.D.  Electronically Signed by Conley Canal  on 02/26/2011 08:08:38 PM

## 2011-02-27 LAB — CBC
HCT: 26 % — ABNORMAL LOW (ref 39.0–52.0)
Hemoglobin: 9.5 g/dL — ABNORMAL LOW (ref 13.0–17.0)
MCH: 29.5 pg (ref 26.0–34.0)
MCHC: 36.5 g/dL — ABNORMAL HIGH (ref 30.0–36.0)
RDW: 13.5 % (ref 11.5–15.5)

## 2011-02-27 LAB — GLUCOSE, CAPILLARY
Glucose-Capillary: 130 mg/dL — ABNORMAL HIGH (ref 70–99)
Glucose-Capillary: 70 mg/dL (ref 70–99)
Glucose-Capillary: 74 mg/dL (ref 70–99)

## 2011-02-27 LAB — BASIC METABOLIC PANEL
BUN: 6 mg/dL (ref 6–23)
Calcium: 9.3 mg/dL (ref 8.4–10.5)
Creatinine, Ser: 0.62 mg/dL (ref 0.50–1.35)
GFR calc Af Amer: >90 mL/min (ref >90)
GFR calc non Af Amer: >90 mL/min (ref >90)
Glucose, Bld: 72 mg/dL (ref 70–99)

## 2011-02-28 LAB — BASIC METABOLIC PANEL
BUN: 5 mg/dL — ABNORMAL LOW (ref 6–23)
CO2: 23 mEq/L (ref 19–32)
Chloride: 101 mEq/L (ref 96–112)
GFR calc Af Amer: >90 mL/min (ref >90)
Potassium: 4.1 mEq/L (ref 3.5–5.1)

## 2011-02-28 LAB — DIFFERENTIAL
Basophils Absolute: 0 10*3/uL (ref 0.0–0.1)
Lymphocytes Relative: 51 % — ABNORMAL HIGH (ref 12–46)
Lymphs Abs: 1.2 10*3/uL (ref 0.7–4.0)
Monocytes Relative: 10 % (ref 3–12)
Neutrophils Relative %: 27 % — ABNORMAL LOW (ref 43–77)

## 2011-02-28 LAB — STOOL CULTURE

## 2011-02-28 LAB — GLUCOSE, CAPILLARY
Glucose-Capillary: 104 mg/dL — ABNORMAL HIGH (ref 70–99)
Glucose-Capillary: 119 mg/dL — ABNORMAL HIGH (ref 70–99)
Glucose-Capillary: 120 mg/dL — ABNORMAL HIGH (ref 70–99)
Glucose-Capillary: 52 mg/dL — ABNORMAL LOW (ref 70–99)
Glucose-Capillary: 69 mg/dL — ABNORMAL LOW (ref 70–99)
Glucose-Capillary: 90 mg/dL (ref 70–99)

## 2011-02-28 LAB — CBC
HCT: 28.9 % — ABNORMAL LOW (ref 39.0–52.0)
Hemoglobin: 10.4 g/dL — ABNORMAL LOW (ref 13.0–17.0)
RBC: 3.56 MIL/uL — ABNORMAL LOW (ref 4.22–5.81)
RDW: 13.7 % (ref 11.5–15.5)
WBC: 2.5 10*3/uL — ABNORMAL LOW (ref 4.0–10.5)

## 2011-02-28 LAB — PATHOLOGIST SMEAR REVIEW

## 2011-03-01 LAB — BASIC METABOLIC PANEL
CO2: 26 mEq/L (ref 19–32)
Calcium: 8.9 mg/dL (ref 8.4–10.5)
Creatinine, Ser: 0.61 mg/dL (ref 0.50–1.35)
GFR calc Af Amer: >90 mL/min (ref >90)

## 2011-03-01 LAB — GLUCOSE, CAPILLARY: Glucose-Capillary: 104 mg/dL — ABNORMAL HIGH (ref 70–99)

## 2011-03-01 LAB — CBC
MCH: 29 pg (ref 26.0–34.0)
MCV: 83.2 fL (ref 78.0–100.0)
Platelets: 263 10*3/uL (ref 150–400)
RDW: 13.9 % (ref 11.5–15.5)

## 2011-03-03 LAB — CULTURE, BLOOD (ROUTINE X 2)
Culture  Setup Time: 201210150146
Culture: NO GROWTH
Culture: NO GROWTH

## 2011-03-05 NOTE — Consult Note (Signed)
[name], [name] NO.:  [id number]  MEDICAL RECORD NO.:  [id number]  LOCATION:  2607                         FACILITY:  [facility]  PHYSICIAN:  Jordan Hawks. Elnoria Howard, MD    DATE OF BIRTH:  Oct 28, 1955  DATE OF CONSULTATION:  02/25/2011 DATE OF DISCHARGE:                                CONSULTATION   REASON FOR CONSULTATION:  Abnormal CT scan.  PRIMARY CARE PROVIDERS:  Betti D. Pecola Leisure, MD  Ted McalpineDanae Orleans. Venetia Maxon, MD  HISTORY OF PRESENT ILLNESS:  This is a 55 year old gentleman who with a past medical history VP shunt placed approximately 15 years ago, hypothyroidism, deconditioning, failure to thrive, intracranial bleed secondary to cocaine abuse, normocytic anemia, and protein-calorie malnutrition was admitted to the hospital with altered mental status. Apparently, the patient has been lethargic for the past few days and during the workup CT scan did not reveal any significant changes with his head CT, but there are also reports of some bleeding from the gastrostomy tube.  He had an episode of aspiration pneumonia earlier in the year and a gastrostomy tube was placed by Interventional Radiology. The CT scan revealed that there may be some focal annular thickening in the ascending colon.  Colonoscopy was performed in 2010 by Dr. Elnoria Howard and it was negative for any abnormalities.  He had an excellent prep and excellent visualization was obtained during the examination.  As a result of this particular finding, GI consultation was requested for further evaluation and treatment.  PAST MEDICAL HISTORY/PAST SURGICAL HISTORY:  As stated above.  FAMILY HISTORY:  Noncontributory.  SOCIAL HISTORY:  Negative for any alcohol, tobacco, or illicit drug use.  REVIEW OF SYSTEMS:  Unable to obtain.  MEDICATIONS: 1. Levofloxacin 750 mg 1 p.o. daily. 2. Flagyl 500 mg IV t.i.d. 3. Protonix 40 mg daily. 4. Tylenol 650 mg p.o. q.4 hours p.r.n. 5. Zofran 4 mg IV q.6 hours  p.r.n.  ALLERGIES: 1. IBUPROFEN. 2. PENICILLIN.  PHYSICAL EXAMINATION:  VITAL SIGNS:  Blood pressure is 89/50, heart rate in the 90s. GENERAL:  The patient does not appear to be in any acute distress.  He is arousable to touch. HEENT:  Normocephalic and atraumatic.  There is a surgical scar from the VP shunt exchanged. NECK:  No lymphadenopathy. LUNGS:  Clear to auscultation bilaterally. CARDIOVASCULAR:  Regular rhythm. ABDOMEN:  Flat and soft.  There is a gastrostomy tube in left upper quadrant. EXTREMITIES:  No clubbing, cyanosis, or edema.  LABORATORY VALUES:  White blood cell count is 3.3, hemoglobin 9.4, MCV 79.8, and platelets at 256.  INR 1.2.  Sodium 121, potassium 4.0, chloride 98, CO2 of 25, glucose 70, BUN 5, creatinine 0.6, total bilirubin 0.4, alk phos 97, AST 49, ALT 34.  IMPRESSION: 1. Altered mental status. 2. Hyponatremia. 3. Abnormal CT scan.  Again, I reviewed the prior colonoscopy report I     performed on the patient and my recollection is that he had     excellent prep and excellent visualization was able to be obtained.     I believed that the finding on the CT scan is an artifact.     Certainly, a repeat colonoscopy will  be performed during this     admission once his hypernatremia is resolved.  PLAN: 1. To correct the hypernatremia. 2. Colonoscopy pending the resolution of hyponatremia.     Jordan Hawks Elnoria Howard, MD     PDH/MEDQ  D:  02/25/2011  T:  02/26/2011  Job:  [id number]  cc:   Jocelyn Lamer D. Pecola Leisure, M.D. Danae Orleans. Venetia Maxon, M.D.  Electronically Signed by Jeani Hawking MD on 03/05/2011 11:48:58 AM

## 2011-03-08 NOTE — Discharge Summary (Signed)
NAMESELSO, MANNOR NO.:  [id number]  MEDICAL RECORD NO.:  [id number]  LOCATION:  3017                         FACILITY:  [facility]  PHYSICIAN:  Marcellus Scott, MD     DATE OF BIRTH:  1955/09/03  DATE OF ADMISSION:  02/24/2011 DATE OF DISCHARGE:  03/01/2011                        DISCHARGE SUMMARY - REFERRING   PRIMARY CARE PHYSICIAN:  Betti D. Pecola Leisure, MD  Ted McalpineDanae Orleans. Venetia Maxon, MD  DISCHARGE DIAGNOSES: 1. Toxic metabolic encephalopathy, improved or resolved. 2. Hyponatremia, resolved. 3. Abnormal CT scan of the abdomen.  Negative colonoscopy. 4. Leukopenia, possibly secondary to antibiotics, improved. 5. Anemia, stable. 6. Hypoglycemia secondary to NPO status, improved. 7. Hypothyroidism. 8. Hydrocephalus, status post VP shunt. 9. Gastrostomy feeding. 10.Internal and external hemorrhoids by colonoscopy. 11.Failure to thrive. 12.History of intracranial bleeding due to cocaine abuse. 14.Severe protein-calorie malnutrition.  DISCHARGE MEDICATIONS: 1. Gluten free 1.5 calories nutritional supplement with fiber (Vital) 270 mL, which is 9 ounces via tube at 6:00 a.m., 11:00 a.m., 3:00 p.m., and 7:00 p.m. daily. 2. Water 100 mL via tube 4 times daily. 3. Multivitamins 1 tablet via tube daily. 4. Simvastatin 20 mg via tube daily. 5. Levothyroxine 75 mcg via tube daily.  PROCEDURES:  Colonoscopy by Dr. Jeani Hawking on February 27, 2010, impression:  A. Normal.  B.  Internal and external hemorrhoids.  IMAGING: 1. CT of the abdomen and pelvis with contrast February 24, 2011,     impression:  Gas within the liver which the radiologist favor is in     the biliary system rather than portal venous gas.  Area of focal     irregular wall thickening within the ascending colon, extending     over approximately 4.6 cm length.  Ventriculoperitoneal shunt     catheter in place.  Small amount of free fluid in the pelvis. 2. CT of the head without contrast, February 24, 2011, impression:  No     evidence of acute intracranial abnormality.  Stable     ventriculomegaly with right frontal approach ventriculostomy     catheter.  Old right basal ganglia lacunar infarct.  Chest x-ray     February 24, 2011, impression:  Stable.  No acute cardiopulmonary     process.  LABORATORY DATA: 1. Basic metabolic panel today showed sodium 134, otherwise within     normal limits.  CBC:  Hemoglobin 10, hematocrit 29, white blood     cell 3.6, and platelets 263.  Peripheral smear showed normocytic-     normochromic red cells.  Normal red cell numbers with     poikilocytosis.  Neutropenia without left shift.  Occasional     activated lymphocytes. 2. Stool culture was negative. 3. Blood cultures x2 are no growth to date. 4. Carcinoembryonic antigen 4.1. 5. Urine culture was suggestive of contamination. 6. C-reactive protein was 3.87. 7. TSH was 2.082. 8. ESR 30. 9. CK-MB 435. 10.Lipid panel within normal limits. 11.Hepatic panel significant for AST 49, albumin 3.1. 12.Troponin negative. 13.Serum osmolarity was 245. 14.Random cortisol 7.5. 15.Urine osmolarity 441. 16.MRSA PCR screening negative. 17.C. difficile PCR negative. 18.Procalcitonin less than 0.10. 19.ProBNP 321. 20.Ammonia 26. 21.Urinalysis not suggestive of urinary tract  infection.  CONSULTATIONS:  Gastroenterology, Dr. Jeani Hawking.  DIET:  Tube feeding as per instructions above.  ACTIVITIES:  Increase activity gradually and with assistance.  TODAY'S COMPLAINTS:  "I am okay."  The patient denies complaints.  He denies pain.  Per nursing, there have been no active events overnight. He is tolerating his tube feeds.  PHYSICAL EXAMINATION:  GENERAL:  The patient is in no obvious distress. VITAL SIGNS:  Temperature 98.1 degrees Fahrenheit, pulse 89 per minute and regular, respirations 16 per minute, blood pressure 94/63 mmHg, and saturating in the 100% on 1 L/minute. RESPIRATORY:   Clear. CARDIOVASCULAR:  First and second heart sounds heard, regular. ABDOMEN:  Nondistended, soft, and normal bowel sounds present. CENTRAL NERVOUS SYSTEM:  The patient is awake, alert, oriented to person and place, partly to time.  He does have lower extremity weakness and intermittent spasms of his legs.  HOSPITAL COURSE:  Mr. Keyworth is a 55 year old male patient with history of hydrocephalus, status post VP shunt placed more than 15 years ago. He was hospitalized in May of this year for aspiration pneumonitis and at that time, the gastrostomy tube was placed.  Since discharge from rehab facility, the patient has been living with his aunt who has been managing his needs.  Two weeks ago, he was not feeling too well, went to see his primary care provider and was started on Bactrim, which did not help him.  He continued to be lethargic.  He presented with 3 bouts of diarrhea, possibly leaking G-tube and some blood around the G-tube.  He was also less communicative than his usual.  On evaluation in the emergency room, he was found to have a sodium of 121 and an abnormal CT of the abdomen and pelvis.  [facility] were requested to admit him for further management. 1. Toxic metabolic encephalopathy.  The etiology was not definitely     clear.  It could have been secondary to his hyponatremic     dehydration complicating underlying SIADH.  He was empirically     placed on IV antibiotics for presumed infectious enteritis.  He was     hydrated with IV fluids.  His sodiums have improved and are almost     normalized.  His mental status apparently is close to his baseline     according to his aunt. 2. Hyponatremia.  This was possibly secondary to dehydration     complicating underlying SIADH.  This is improved.  Recommend     following up of his BMET when he sees his primary care physician. 3. Hypoglycemia.  The patient had multiple episodes of hypoglycemia     while his tube feeds  were held for bowel preparation for     colonoscopy.  He was placed on dextrose water.  He has been resumed     on his tube feeds post procedure, and his hypoglycemia has     resolved. 4. Abnormal CT of the abdomen.  Gastroenterology was consulted.  They     proceeded to perform a colonoscopy and did not find any acute     findings.  His antibiotics were promptly stopped. 5. Hydrocephalus, status post VP shunt, and associated cognitive     impairment.  Management as per problem #1.  Outpatient followup     with his neurosurgeon. 6. Leukopenia, possibly secondary to side effect from antibiotics.     These were discontinued and his white blood cell count has     improved. 7. Disposition.  The patient's Aunt expressed some concern about being able     to manage his care at home.  Social worker and case management     discussed with her and she insisted on taking him home with some     additional help such as home health aide, which has been arranged.  DISPOSITION:  The patient is discharged home in stable condition.  FOLLOWUP RECOMMENDATIONS: 1. With Dr. Leilani Able in 4-5 days from discharge with blood tests,     that is CBC and basic metabolic panel.  Family are to call for     appointment. 2. With Dr. Maeola Harman.  The patient's family are to call for an     appointment. Time taken in coordinating this discharge is 30 minutes.     Marcellus Scott, MD     AH/MEDQ  D:  03/01/2011  T:  03/01/2011  Job:  [id number]  cc:   Jocelyn Lamer D. Pecola Leisure, M.D. Danae Orleans. Venetia Maxon, M.D.  Electronically Signed by Marcellus Scott MD on 03/08/2011 09:00:28 PM

## 2011-06-14 ENCOUNTER — Other Ambulatory Visit: Payer: Self-pay | Admitting: Family Medicine

## 2011-06-14 ENCOUNTER — Ambulatory Visit
Admission: RE | Admit: 2011-06-14 | Discharge: 2011-06-14 | Disposition: A | Discharge status: Home / Self Care | Payer: No Typology Code available for payment source | Source: Ambulatory Visit | Attending: Family Medicine | Admitting: Family Medicine

## 2011-08-12 DIAGNOSIS — Z8739 Personal history of other diseases of the musculoskeletal system and connective tissue: Secondary | ICD-10-CM

## 2011-08-12 HISTORY — DX: Personal history of other diseases of the musculoskeletal system and connective tissue: Z87.39

## 2012-01-20 ENCOUNTER — Ambulatory Visit (HOSPITAL_COMMUNITY)
Admission: RE | Admit: 2012-01-20 | Discharge: 2012-01-20 | Disposition: A | Discharge status: Home / Self Care | Payer: Medicaid Other | Source: Ambulatory Visit | Attending: Family Medicine | Admitting: Family Medicine

## 2012-01-20 DIAGNOSIS — R1310 Dysphagia, unspecified: Secondary | ICD-10-CM

## 2012-01-20 DIAGNOSIS — K9423 Gastrostomy malfunction: Secondary | ICD-10-CM

## 2012-01-20 DIAGNOSIS — R1319 Other dysphagia: Secondary | ICD-10-CM | POA: Insufficient documentation

## 2012-01-20 DIAGNOSIS — R1314 Dysphagia, pharyngoesophageal phase: Secondary | ICD-10-CM | POA: Insufficient documentation

## 2012-01-20 DIAGNOSIS — Z931 Gastrostomy status: Secondary | ICD-10-CM | POA: Insufficient documentation

## 2012-01-20 NOTE — Procedures (Signed)
Objective Swallowing Evaluation: Modified Barium Swallowing Study  Patient Details  Name: [name] MRN: [id number] Date of Birth: [birth year]  Today's Date: 01/20/2012 Time: 1115-1150 SLP Time Calculation (min): 35 min  Past Medical History: No past medical history on file. Past Surgical History: No past surgical history on file. HPI:  Pt is a 56 year old male with hx of right ICH with shunt in 1996 arriving for an outpatient MBS from SNF. Pt has a history of severe dysphagia with PEG placement in 5/12 after deconditioning from pna. MBS showed structural dysphagia with cervical osteophytes from C5-6 impeding opening of UES with severe pharyngeal residuals and silent aspiration. Pt arrives today to determine if he may safely begin to consume POs.      Assessment / Plan / Recommendation Clinical Impression  Dysphagia Diagnosis: Moderate cervical esophageal phase dysphagia;Moderate pharyngeal phase dysphagia Clinical impression: Pt seen for repeat MBS with modest improvements in swallow function due to increased strength and improved cognition to follow compensatory strategies. Dysphagia is a combined result of structural impedance of UES by cervical osteophyte at C6/7 as well as left sided pharyngeal/laryngeal weakness. With head neutral and no use of strategies, thin, thick and puree consistencies result in severe vallecular and pyriform sinus residuals due to approx 25% of boluses passing though UES. Silent aspriation/penetration occurs both during and after the swallow due to decreased laryngeal closure and poor ability to clear residual due to pharyngeal weakness and osteophyte. However, when instructed to turn head to the left and utlize multiple effortful swallows, laryngeal closure improves, pharngeal contraction increases and majority of bolus passes UES. A hard cough expels any penetrate in airway.  Pt is ready to begin trials of soft solid meals (dys 2) with sips of nectar thick liquids  with full supervision from SLP to uitlize strategies. High aspiration risk persists. Any acute illness likely to result in decompensation and worsening of function.     Treatment Recommendation       Diet Recommendation Dysphagia 2 (Fine chop);Nectar-thick liquid   Liquid Administration via: Cup;Spoon Medication Administration: Via alternative means Supervision: Patient able to self feed;Full supervision/cueing for compensatory strategies (from SLP only, initially) Compensations: Slow rate;Small sips/bites;Multiple dry swallows after each bite/sip;Hard cough after swallow;Effortful swallow Postural Changes and/or Swallow Maneuvers: Head turn left during swallow    Other  Recommendations Oral Care Recommendations: Oral care BID   Follow Up Recommendations  Skilled Nursing facility    Frequency and Duration        Pertinent Vitals/Pain NA    SLP Swallow Goals     General HPI: Pt is a 56 year old male with hx of right ICH with shunt in 1996 arriving for an outpatient MBS from SNF. Pt has a history of severe dysphagia with PEG placement in 5/12 after deconditioning from pna. MBS showed structural dysphagia with cervical osteophytes from C5-6 impeding opening of UES with severe pharyngeal residuals and silent aspiration. Pt arrives today to determine if he may safely begin to consume POs.  Type of Study: Modified Barium Swallowing Study Reason for Referral: Objectively evaluate swallowing function Previous Swallow Assessment: MBS 09/21/11, 10/25/11 Diet Prior to this Study: NPO;PEG tube Temperature Spikes Noted: No Respiratory Status: Room air History of Recent Intubation: No Behavior/Cognition: Alert;Cooperative;Pleasant mood Oral Cavity - Dentition: Poor condition;Missing dentition Oral Motor / Sensory Function: Impaired motor;Impaired sensory Oral impairment: Left lingual;Left facial;Left labial Self-Feeding Abilities: Able to feed self Patient Positioning: Upright in  chair Baseline Vocal Quality: Clear Volitional Cough:  Strong Volitional Swallow: Able to elicit Anatomy: Other (Comment) (Cervical osteophyte at C6/7)    Reason for Referral Objectively evaluate swallowing function   Oral Phase     Pharyngeal Phase Pharyngeal Phase: Impaired   Cervical Esophageal Phase    GO Functional Assessment Tool Used:  (clinical judgement) Functional Limitations: Swallowing Swallow Current Status ([id number]): At least 60 percent but less than 80 percent impaired, limited or restricted Swallow Discharge Status [phone]): At least 40 percent but less than 60 percent impaired, limited or restricted  Cervical Esophageal Phase: Impaired   Harlon Ditty, MA CCC-SLP [phone]  Claudine Mouton 01/20/2012, 1:19 PM

## 2012-04-15 ENCOUNTER — Inpatient Hospital Stay (HOSPITAL_COMMUNITY)
Admission: EM | Admit: 2012-04-15 | Discharge: 2012-04-17 | DRG: 177 | Disposition: A | Discharge status: Home / Self Care | Payer: Medicare (Managed Care) | Attending: Internal Medicine | Admitting: Internal Medicine

## 2012-04-15 ENCOUNTER — Encounter (HOSPITAL_COMMUNITY): Payer: Self-pay | Admitting: Internal Medicine

## 2012-04-15 ENCOUNTER — Emergency Department (HOSPITAL_COMMUNITY): Payer: Medicare (Managed Care)

## 2012-04-15 DIAGNOSIS — I629 Nontraumatic intracranial hemorrhage, unspecified: Secondary | ICD-10-CM | POA: Insufficient documentation

## 2012-04-15 DIAGNOSIS — I69991 Dysphagia following unspecified cerebrovascular disease: Secondary | ICD-10-CM

## 2012-04-15 DIAGNOSIS — I959 Hypotension, unspecified: Secondary | ICD-10-CM | POA: Diagnosis present

## 2012-04-15 DIAGNOSIS — J690 Pneumonitis due to inhalation of food and vomit: Principal | ICD-10-CM | POA: Diagnosis present

## 2012-04-15 DIAGNOSIS — E785 Hyperlipidemia, unspecified: Secondary | ICD-10-CM | POA: Insufficient documentation

## 2012-04-15 DIAGNOSIS — Z982 Presence of cerebrospinal fluid drainage device: Secondary | ICD-10-CM

## 2012-04-15 DIAGNOSIS — Z931 Gastrostomy status: Secondary | ICD-10-CM

## 2012-04-15 DIAGNOSIS — A419 Sepsis, unspecified organism: Secondary | ICD-10-CM | POA: Diagnosis present

## 2012-04-15 DIAGNOSIS — Z8639 Personal history of other endocrine, nutritional and metabolic disease: Secondary | ICD-10-CM

## 2012-04-15 DIAGNOSIS — R6510 Systemic inflammatory response syndrome (SIRS) of non-infectious origin without acute organ dysfunction: Secondary | ICD-10-CM

## 2012-04-15 DIAGNOSIS — I69391 Dysphagia following cerebral infarction: Secondary | ICD-10-CM

## 2012-04-15 DIAGNOSIS — E039 Hypothyroidism, unspecified: Secondary | ICD-10-CM | POA: Diagnosis present

## 2012-04-15 DIAGNOSIS — M62461 Contracture of muscle, right lower leg: Secondary | ICD-10-CM | POA: Insufficient documentation

## 2012-04-15 DIAGNOSIS — E43 Unspecified severe protein-calorie malnutrition: Secondary | ICD-10-CM | POA: Insufficient documentation

## 2012-04-15 DIAGNOSIS — D649 Anemia, unspecified: Secondary | ICD-10-CM | POA: Diagnosis present

## 2012-04-15 DIAGNOSIS — Z862 Personal history of diseases of the blood and blood-forming organs and certain disorders involving the immune mechanism: Secondary | ICD-10-CM

## 2012-04-15 DIAGNOSIS — F17200 Nicotine dependence, unspecified, uncomplicated: Secondary | ICD-10-CM | POA: Diagnosis present

## 2012-04-15 DIAGNOSIS — R1310 Dysphagia, unspecified: Secondary | ICD-10-CM | POA: Diagnosis present

## 2012-04-15 HISTORY — DX: Hyperlipidemia, unspecified: E78.5

## 2012-04-15 HISTORY — DX: Presence of cerebrospinal fluid drainage device: Z98.2

## 2012-04-15 HISTORY — DX: Hypothyroidism, unspecified: E03.9

## 2012-04-15 HISTORY — DX: Unspecified protein-calorie malnutrition: E46

## 2012-04-15 HISTORY — DX: Dysphagia, unspecified: R13.10

## 2012-04-15 HISTORY — DX: Nontraumatic intracranial hemorrhage, unspecified: I62.9

## 2012-04-15 HISTORY — DX: Personal history of other diseases of the musculoskeletal system and connective tissue: Z87.39

## 2012-04-15 LAB — COMPREHENSIVE METABOLIC PANEL
Albumin: 3.9 g/dL (ref 3.5–5.2)
BUN: 21 mg/dL (ref 6–23)
Calcium: 10.4 mg/dL (ref 8.4–10.5)
Creatinine, Ser: 0.88 mg/dL (ref 0.50–1.35)
GFR calc Af Amer: >90 mL/min (ref >90)
Potassium: 3.9 mEq/L (ref 3.5–5.1)
Total Protein: 7.4 g/dL (ref 6.0–8.3)

## 2012-04-15 LAB — CBC WITH DIFFERENTIAL/PLATELET
Eosinophils Absolute: 0 10*3/uL (ref 0.0–0.7)
Eosinophils Relative: 0 % (ref 0–5)
Lymphs Abs: 0.7 10*3/uL (ref 0.7–4.0)
MCH: 29.6 pg (ref 26.0–34.0)
MCV: 89.3 fL (ref 78.0–100.0)
Monocytes Relative: 6 % (ref 3–12)
Platelets: 232 10*3/uL (ref 150–400)
RBC: 4.19 MIL/uL — ABNORMAL LOW (ref 4.22–5.81)

## 2012-04-15 LAB — POCT I-STAT, CHEM 8
BUN: 23 mg/dL (ref 6–23)
Calcium, Ion: 1.27 mmol/L — ABNORMAL HIGH (ref 1.12–1.23)
Glucose, Bld: 93 mg/dL (ref 70–99)
TCO2: 22 mmol/L (ref 0–100)

## 2012-04-15 LAB — PROCALCITONIN: Procalcitonin: 1.03 ng/mL

## 2012-04-15 LAB — URINALYSIS, ROUTINE W REFLEX MICROSCOPIC
Bilirubin Urine: NEGATIVE
Glucose, UA: NEGATIVE mg/dL
Hgb urine dipstick: NEGATIVE
Ketones, ur: NEGATIVE mg/dL
Protein, ur: NEGATIVE mg/dL

## 2012-04-15 LAB — CG4 I-STAT (LACTIC ACID): Lactic Acid, Venous: 3.46 mmol/L — ABNORMAL HIGH (ref 0.5–2.2)

## 2012-04-15 MED ORDER — SODIUM CHLORIDE 0.9 % IV SOLN
1000.0000 mL | Freq: Once | INTRAVENOUS | Status: AC
  Administered 2012-04-15: 1000 mL via INTRAVENOUS

## 2012-04-15 MED ORDER — VANCOMYCIN HCL IN DEXTROSE 1-5 GM/200ML-% IV SOLN
1000.0000 mg | Freq: Once | INTRAVENOUS | Status: AC
  Administered 2012-04-15: 1000 mg via INTRAVENOUS
  Filled 2012-04-15: qty 200

## 2012-04-15 MED ORDER — VANCOMYCIN HCL 1000 MG IV SOLR
750.0000 mg | Freq: Two times a day (BID) | INTRAVENOUS | Status: DC
  Filled 2012-04-15: qty 750

## 2012-04-15 MED ORDER — ENOXAPARIN SODIUM 40 MG/0.4ML ~~LOC~~ SOLN
40.0000 mg | Freq: Every day | SUBCUTANEOUS | Status: DC
  Administered 2012-04-16 – 2012-04-17 (×2): 40 mg via SUBCUTANEOUS
  Filled 2012-04-15 (×2): qty 0.4

## 2012-04-15 MED ORDER — DEXTROSE 5 % IV SOLN
2.0000 g | INTRAVENOUS | Status: AC
  Administered 2012-04-15: 2 g via INTRAVENOUS
  Filled 2012-04-15: qty 2

## 2012-04-15 MED ORDER — ACETAMINOPHEN 325 MG PO TABS: 650.0000 mg | ORAL_TABLET | ORAL | Status: DC | PRN

## 2012-04-15 MED ORDER — LEVOTHYROXINE SODIUM 75 MCG PO TABS
75.0000 ug | ORAL_TABLET | Freq: Every day | ORAL | Status: DC
  Administered 2012-04-16 – 2012-04-17 (×2): 75 ug
  Filled 2012-04-15 (×3): qty 1

## 2012-04-15 MED ORDER — ONDANSETRON HCL 4 MG/2ML IJ SOLN: 4.0000 mg | Freq: Four times a day (QID) | INTRAMUSCULAR | Status: DC | PRN

## 2012-04-15 MED ORDER — ONDANSETRON HCL 4 MG PO TABS: 4.0000 mg | ORAL_TABLET | Freq: Four times a day (QID) | ORAL | Status: DC | PRN

## 2012-04-15 MED ORDER — ADULT MULTIVITAMIN LIQUID CH
15.0000 mL | Freq: Every day | ORAL | Status: DC
  Administered 2012-04-16 – 2012-04-17 (×2): 15 mL
  Filled 2012-04-15 (×2): qty 15

## 2012-04-15 MED ORDER — SODIUM CHLORIDE 0.9 % IV SOLN
250.0000 mg | Freq: Four times a day (QID) | INTRAVENOUS | Status: DC
  Administered 2012-04-16: 250 mg via INTRAVENOUS
  Filled 2012-04-15 (×3): qty 250

## 2012-04-15 MED ORDER — SODIUM CHLORIDE 0.9 % IV SOLN: 1000.0000 mL | INTRAVENOUS | Status: DC

## 2012-04-15 MED ORDER — DEXTROSE 5 % IV SOLN
2.0000 g | Freq: Once | INTRAVENOUS | Status: DC
  Filled 2012-04-15: qty 2

## 2012-04-15 MED ORDER — SODIUM CHLORIDE 0.9 % IV BOLUS (SEPSIS)
1000.0000 mL | Freq: Once | INTRAVENOUS | Status: AC
  Administered 2012-04-15: 1000 mL via INTRAVENOUS

## 2012-04-15 MED ORDER — LEVOFLOXACIN IN D5W 750 MG/150ML IV SOLN
750.0000 mg | Freq: Once | INTRAVENOUS | Status: AC
  Administered 2012-04-15: 750 mg via INTRAVENOUS
  Filled 2012-04-15: qty 150

## 2012-04-15 MED ORDER — DEXTROSE 5 % IV SOLN
2.0000 g | Freq: Three times a day (TID) | INTRAVENOUS | Status: DC
  Filled 2012-04-15 (×2): qty 2

## 2012-04-15 MED ORDER — LEVOFLOXACIN IN D5W 750 MG/150ML IV SOLN: 750.0000 mg | INTRAVENOUS | Status: DC

## 2012-04-15 MED ORDER — SODIUM CHLORIDE 0.9 % IV SOLN
INTRAVENOUS | Status: DC
  Administered 2012-04-15 – 2012-04-16 (×2): via INTRAVENOUS

## 2012-04-15 NOTE — ED Notes (Signed)
Patient transported to CT 

## 2012-04-15 NOTE — ED Notes (Signed)
Incontinent of urine - wearing brief,

## 2012-04-15 NOTE — H&P (Signed)
Hospital Admission Note Date: 04/15/2012  Patient name: [name] Medical record number: [id number] Date of birth: 01/01/1956 Age: 56 y.o. Gender: male PCP: Roy Chimera, MD  Medical Service: Internal Medicine Teaching Services  Attending physician:  Dr. Eben [name]   1st Contact:  Dr. Shirlee [name]  [phone] 2nd Contact:  Dr. Milbert [name]  Pager:[phone] After 5 pm or weekends: 1st Contact:      Pager: [phone] 2nd Contact:      Pager: [phone]  Chief Complaint: Fever  History of Present Illness: [name] is a 56 yo man with PMH significant for hemorrhagic stroke in 1996 secondary to cocaine use, with several Neurological deficits, s/p VP shunt placement, s/p PEG tube placement, and recent UTI on 11/18, [facility] the [facility] who comes in to the [facility] ED with temperature of 105.40F (rectal). He has difficult to understand speech but based on [facility] notes, he temperature of 100.32F yesterday (he attends [facility] on Tuesdays and Thursdays). He complains of a cough productive of "balls" of sputum for almost 2 weeks now, he does not know the color of the sputum. He had post-tussive emesis once last week but denies N/V, abdominal pain, and fever and chills prior to today. He reports having a fall last Friday, he fell on his back while trying to go up some steps from his garden. He denies head injury, or severe injuries anywhere in his body. He walks at home with a cane. Presently he denies pain anywhere in his body.   He also denies headache, photophobia, confusion, visual changes, dizziness, lightheadedness, chest pain, shortness of breath, abdominal pain, diarrhea, constipation, dysuria, bowel or bladder incontinence, back pain, or gait changes.   Of note, he has chronic neck stiffness secondary to C5-C6 bone spur and is unable to fully flex his neck.   Meds: Medications Prior to Admission  Medication Sig Dispense Refill  . acetaminophen (TYLENOL) 325 MG tablet Give 650 mg by tube every 4 (four)  hours as needed. For pain      . guaifenesin (ROBITUSSIN) 100 MG/5ML syrup Take 200 mg by mouth every 4 (four) hours as needed. For cough      . levothyroxine (SYNTHROID, LEVOTHROID) 75 MCG tablet 75 mcg by PEG Tube route daily.      . Multiple Vitamin (MULTIVITAMIN) LIQD Give 15 mLs by tube daily.        Allergies: Allergies as of 04/15/2012 - Review Complete 04/15/2012  Allergen Reaction Noted  . Ibuprofen  04/15/2012  . Penicillins  04/15/2012   Past Medical History  Diagnosis Date  . 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 VP (ventriculoperitoneal) shunt status 1996 and currently  . 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 Hypothyroidism   . Malnutrition     Has PEG tube un place  . Hyperlipidemia   . History of gout 4/13    right foot   Past Surgical History  Procedure Date  . Ventriculoperitoneal shunt 1996, 2011    Has VP shunt in place as of 04/15/12  . Peg tube placement 09/2010    Has PEG tube in place as of 04/15/12   History reviewed. No pertinent family history. History   Social  History  . Marital Status: Single    Spouse Name: N/A    Number of Children: N/A  . Years of Education: N/A   Occupational History  . Not on file.   Social History Main Topics  . Smoking status: Current Every Day Smoker    Types: Cigarettes  . Smokeless tobacco: Former Neurosurgeon    Quit date: 04/15/2004  . Alcohol Use: No  . Drug Use: No     Comment: Used drugs heavily until his hemorrhagic stroke in 09/29/94 (thought to be secondary to cocaine use)  . Sexually Active: Not on file   Other Topics Concern  . Not on file   Social History Narrative   He lives with his aunt, [name]. He  has been in the [facility] program since June 14, 2011. Apparently he was fairly functional after his stroke in 09-29-1994. He lived with his mother in [place] until she died in 29-Sep-2003. He moved to [place] to live with his aunt in 29-Sep-2003. He had gradually declined since the stroke but was much worse in the last few years. At the time of admission to [facility] he just sat in his chair all day watching TV and falling asleep. His muscles had become atrophic and he developed right sided leg contracture in addition to his left sided weakness. He also had choking and aspiration. Since starting [facility] he has made progress with gait, speech, balance, and oral intake.     Review of Systems: Pertinent items are noted in HPI.  Physical Exam: Blood pressure 89/54, pulse 95, temperature 101 F (38.3 C), temperature source Rectal, resp. rate 24, height 5\' 9"  (1.753 m), weight 119 lb (53.978 kg), SpO2 100.00%. BP 89/54  Pulse 95  Temp 101 F (38.3 C) (Rectal)  Resp 24  Ht 5\' 9"  (1.753 m)  Wt 119 lb (53.978 kg)  BMI 17.57 kg/m2  SpO2 100%  General Appearance:    Alert, cooperative, no distress, appears older than stated age, very thin, diaphoretic  Head:    Normocephalic, without obvious abnormality, atraumatic, predominant temporal vessel on the right  Eyes:    Right pupil normal size and reactive to light, left pupil is constricted to 3mm, conjunctiva/corneas clear, EOM: unable to gaze downwards,  Left eye deviates to the left      Nose:   Nares normal, septum midline, no drainage  or sinus tenderness  Throat:   Lips, mucosa, and tongue normal but dry MM; poor dentition.   Neck:   Stiffness that is chronic, no neck pain, symmetrical, trachea midline, no adenopathy;         Back:     Symmetric, no curvature, ROM limited secondary to muscle weakness, no CVA tenderness. No bruises, abrasions, trauma, or sacral ulcer   Lungs:     Clear to auscultation bilaterally, respirations unlabored  Chest wall:    No tenderness or  deformity  Heart:    Regular rate and rhythm, S1 and S2 normal, no murmur, rub   or gallop  Abdomen:     Soft, non-tender, bowel sounds active all four quadrants,    no masses, no organomegaly. PEG tube in place with no surrounding edema or erythema        Extremities:   Extremities atraumatic, no cyanosis or edema, right leg contracture with limited ROM (unable to fully extend leg), decreased muscle bulk, increased muscle tone  Pulses:   2+ and symmetric all extremities  Skin:   Skin color, texture, turgor normal, no rashes  or lesions     Neurologic:   Alert and Oriented x3. CN V, VI, VII, VIII, and XI intact. Strength in UE 5/5 on left, 4/5 on right, in LE 5/5 bilaterally sensation intact throughout. Unable to fully assess for Kernig's sign due to right leg contracture but appears negative. Unable to assess for Brudzinski sign secondary to chronic neck stiffness and right leg contracture.     Lab results: Basic Metabolic Panel:  Basename 04/15/12 1835 04/15/12 1831  NA 142 142  K 3.9 3.7  CL 105 108  CO2 23 --  GLUCOSE 90 93  BUN 21 23  CREATININE 0.88 1.00  CALCIUM 10.4 --  MG -- --  PHOS -- --   Liver Function Tests:  [facility] 04/15/12 1835  AST 66*  ALT 56*  ALKPHOS 68  BILITOT 0.6  PROT 7.4  ALBUMIN 3.9   CBC:  Basename 04/15/12 1831 04/15/12 1818  WBC -- 8.6  NEUTROABS -- 7.3  HGB 12.9* 12.4*  HCT 38.0* 37.4*  MCV -- 89.3  PLT -- 232   Urine Drug Screen: Drugs of Abuse     Component Value Date/Time   LABOPIA NONE DETECTED 09/11/2010 1853   COCAINSCRNUR NONE DETECTED 09/11/2010 1853   LABBENZ NONE DETECTED 09/11/2010 1853   AMPHETMU NONE DETECTED 09/11/2010 1853   THCU NONE DETECTED 09/11/2010 1853   LABBARB  Value: NONE DETECTED        DRUG SCREEN FOR MEDICAL PURPOSES ONLY.  IF CONFIRMATION IS NEEDED FOR ANY PURPOSE, NOTIFY LAB WITHIN 5 DAYS.        LOWEST DETECTABLE LIMITS FOR URINE DRUG SCREEN Drug Class       Cutoff (ng/mL) Amphetamine      1000 Barbiturate       200 Benzodiazepine   200 Tricyclics       300 Opiates          300 Cocaine          300 THC              50 09/11/2010 1853    Urinalysis:  Basename 04/15/12 1816  COLORURINE YELLOW  LABSPEC 1.015  PHURINE 5.5  GLUCOSEU NEGATIVE  HGBUR NEGATIVE  BILIRUBINUR NEGATIVE  KETONESUR NEGATIVE  PROTEINUR NEGATIVE  UROBILINOGEN 1.0  NITRITE NEGATIVE  LEUKOCYTESUR NEGATIVE   Imaging results:  Ct Head Wo Contrast  04/15/2012  *RADIOLOGY REPORT*  Clinical Data: Decreased level of consciousness.  Fever.  CT HEAD WITHOUT CONTRAST  Technique:  Contiguous axial images were obtained from the base of the skull through the vertex without contrast.  Comparison: 02/24/2011  Findings: There is no evidence of intracranial hemorrhage, brain edema or other signs of acute infarction.  There is no evidence of intracranial mass lesion or mass effect.  No abnormal extra-axial fluid collections are identified.  Ventricles are stable in size.  A right frontal ventricular shunt catheter is seen with tip remaining in the posterior third ventricle.  Associated right frontal lobe encephalomalacia is unchanged.  Multiple old lacunar infarcts involving the right basal ganglia and internal capsule are also unchanged.   No new intracranial findings are identified.  IMPRESSION:  1.  No acute intracranial findings. 2.  Stable ventricular size with ventricular shunt catheter in stable position. 3.  Stable right frontal encephalomalacia and old right basal ganglia lacunes.   Original Report Authenticated By: Myles Rosenthal, M.D.    Dg Chest Port 1 View  (if Code Sepsis Called)  04/15/2012  *RADIOLOGY REPORT*  Clinical  Data: Fever  PORTABLE CHEST - 1 VIEW  Comparison: 06/14/2011  Findings: Cardiomediastinal silhouette is stable.  No acute infiltrate or pleural effusion.  No pulmonary edema.  Right VP shunt catheter is unchanged in position.  IMPRESSION: No active disease.  No significant change.   Original Report Authenticated By: Natasha Mead,  M.D.     Other results: EKG: sinus tachycardia, probable anteroseptal infarct (qwaves in leads III and V2), ST depression in V3-V6  Assessment & Plan by Problem: 56 yo man with PHM of CVA with permanent Neurological deficits, VP shunt, presenting with fever of 105.76F (rectal), has cough of white sputum for weeks but CXR unremarkable for PNA, hx of UTI on 11/28 with Proteus (unclear on how it was treated) but unremarkable UA today, urine culture pending, CT head without contrast with no VP shunt malfunction.   SIRS: Patient has tachycardia, tachypnea, and fever with Tmax of 105.76F (rectal). In addition his BP is low (lowest at 75/45). Sepsis is highly likely although a source of infection has not been found and he does not have leukocytosis. Possible sources of infection considered are UTI (he has hx of Proteus UTI in 11/18 with unclear treatment course but unremarkable UA today and no dysuria), PNA (he reports 2 weeks of cough with white sputum but his CXR is unremarkable for PNA), intraabdominal abscess or infection (he has PEG tube in place but no surrounding edema or erythema, he also has VP shunt in place, but no abdominal tenderness of signs of peritonitis), CNS infection (he has VP shunt in place, it is difficult to assess for meningismus secondary to his chronic neck stiffness and right LE contractures). He does not have sacral ulcer or skin ulcers noted on exam. C.diff colitis is also being considered as it was in his previous admission but very unlikely as he denies diarrhea or abdominal pain. His lactic acid is elevated to 3.46, procalcitonin 1.03 consistent with possible systemic infection. In the ED he was started on broad antibiotic coverage with Vancomycin, Aztreonam (penicilin allergic), and Levaquin. He was given 3L NS in the ED with moderate BP response.  -Admit to [facility] -PCCM consulted, appreciate recommendations and assistance with possible emergent LP -Neurosurgery consulted, Dr.  Wynetta Emery. VP shunt not to be accessed at this time but LP highly recommended for CSF analysis.  -ID consult in AM -Continue imipenem for broader Gram negative and anaerobic coverage, as well as Pseudomonas coverage -Continue Vancomycin, and Levaquin, will narrow down therapy as (or if) source of infection is found -f/u CT abdomen with contrast -Continue NS @ 152ml/hr -f/u blood cultures  -f/u urine cultures  -LP with CSF analysis per PCCM -C.diff PCR of stool -Tylenol PRN for temp >100.76F - f/u influenza nasal PCR - HIV test - f/u on repeat lactic acid (trend?)  Fever: Fever of unknown origin at this point but highly likely secondary to infection with work up per above.   Dysphagia/Malnutrition. This is secondary to his hemorrhagic stroke. He has PEG tube in place but has had oral feeds.  -Nutrition consult  Hypothyroidism. On home synthroid . Last TSH in EMR at 2.082 in 02/25/1011. Will continue this medication.  -f/u TSH  Hyperlipidemia.  Last LDL in EMR at 58 in 02/25/2011. Not on home medication. Will defer follow up and lab work up to outpatient management.   History of intracranial bleed. Hemorrhagic CVA with permanent Neurologic deficits including left side weakness and contracture of muscle of right lower extremity. He has improved with Rehab  therapy.  -PT/OT consult once clinically improved  Dispo: Disposition is deferred at this time, awaiting improvement of current medical problems. Anticipated discharge in approximately 2-3 day(s).   The patient does have a current PCP Marny Lowenstein, MD), therefore will not be requiring OPC follow-up after discharge.   The patient does have transportation limitations that hinder transportation to clinic appointments.  SignedKy Barban 04/15/2012, 11:23 PM

## 2012-04-15 NOTE — ED Notes (Addendum)
To ED via [facility] with [facility] level of consciousness and fever. States pt fell asleep around 10am, and when he woke up he was not able to walk, was not as awake as normal. Called EMS- on arrival to ED was awake, answered questions appropriately, moves all extremities, has hx of cerebral bleed, with shunt and left sided weakness, residual aphasia, has G-tube, received 650mg  Tylenol via G-tube at [facility].

## 2012-04-15 NOTE — ED Notes (Signed)
Paged CCM to [phone]

## 2012-04-15 NOTE — Progress Notes (Signed)
ANTIBIOTIC CONSULT NOTE - INITIAL  Pharmacy Consult for Vancomycin, Levaquin and Aztreonam Indication: rule out sepsis  Allergies  Allergen Reactions  . Ibuprofen     Unknown  . Penicillins     Unknown    Patient Measurements: Height: 5\' 9"  (175.3 cm) Weight: 119 lb (53.978 kg) IBW/kg (Calculated) : 70.7  Adjusted Body Weight:   Vital Signs: Temp: 105.4 F (40.8 C) (12/04 1800) Temp src: Rectal (12/04 1800) BP: 93/62 mmHg (12/04 1815) Pulse Rate: 118  (12/04 1815) Intake/Output from previous day:   Intake/Output from this shift:    Labs:  Basename 04/15/12 1831 04/15/12 1818  WBC -- 8.6  HGB 12.9* 12.4*  PLT -- 232  LABCREA -- --  CREATININE 1.00 --   Estimated Creatinine Clearance: 63.8 ml/min (by C-G formula based on Cr of 1).  Microbiology: No results found for this or any previous visit (from the past 720 hour(s)).  Medical History: No past medical history on file.  Medications:  See electronic med rec  Assessment: 55yom with fever  to start Vancomycin, Levaquin and Aztreonam (PCN allergy) for possible sepsis. Vancomycin 1g has been ordered by ED physician.  - Weight: 54kg - CrCl 64 ml/min  Goal of Therapy:  Vancomycin trough level 15-20 mcg/ml  Plan:  1. Vancomycin 750mg  IV q12h 2. Levaquin 750mg  IV q24h 3. Aztreonam 2g IV q8h 4. Monitor renal function, cultures and order trough at steady state  Cleon Dew [phone] 04/15/2012,7:01 PM

## 2012-04-15 NOTE — ED Notes (Signed)
Attempted to call report. Floor RN unable to accept report.  

## 2012-04-15 NOTE — ED Provider Notes (Signed)
History     CSN: [id number]  Arrival date & time 04/15/12  1737   First MD Initiated Contact with Patient 04/15/12 1758    level 5 caveat due to altered mental status   Chief Complaint  Patient presents with  . Fever  . Altered Mental Status    (Consider location/radiation/quality/duration/timing/severity/associated sxs/prior treatment) Patient is a 56 y.o. male presenting with fever and altered mental status. The history is provided by the patient and a relative.  Fever Primary symptoms of the febrile illness include fever and altered mental status. Primary symptoms do not include headaches, shortness of breath, abdominal pain, nausea, vomiting, diarrhea or rash.  Altered Mental Status Pertinent negatives include no chest pain, no abdominal pain, no headaches and no shortness of breath.   patient was sent in by [facility] for altered mental status and fever. Has recently been treated for urinary tract infection. He's been doing worse over the last few days. He's had urinary frequency. He has a G-tube. He has a VP shunt from a previous head bleed secondary to cocaine.  Past Medical History  Diagnosis Date  . 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 VP (ventriculoperitoneal) shunt status 1996 and currently  . 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 Hypothyroidism   . Malnutrition     Has PEG tube un place  . Hyperlipidemia   . History of gout 4/13    right foot    Past Surgical History  Procedure Date  . Ventriculoperitoneal shunt 1996, 2011    Has VP shunt in place as of 04/15/12  . Peg tube placement 09/2010    Has PEG tube in place as of 04/15/12    History reviewed. No  pertinent family history.  History  Substance Use Topics  . Smoking status: Current Every Day Smoker    Types: Cigarettes  . Smokeless tobacco: Former Neurosurgeon    Quit date: 04/15/2004  . Alcohol Use: No      Review of Systems  Unable to perform ROS: Mental status change  Constitutional: Positive for fever. Negative for activity change and appetite change.  HENT: Negative for neck stiffness.   Eyes: Negative for pain.  Respiratory: Negative for chest tightness and shortness of breath.   Cardiovascular: Negative for chest pain and leg swelling.  Gastrointestinal: Negative for nausea, vomiting, abdominal pain and diarrhea.  Genitourinary: Negative for flank pain.  Musculoskeletal: Negative for back pain.  Skin: Negative for rash.  Neurological: Negative for weakness, numbness and headaches.  Psychiatric/Behavioral: Positive for altered mental status. Negative for behavioral problems.    Allergies  Ibuprofen and Penicillins  Home Medications   No current outpatient prescriptions on file.  BP 89/54  Pulse 95  Temp 101 F (38.3 C) (Rectal)  Resp 24  Ht 5\' 9"  (1.753 m)  Wt 119 lb (53.978 kg)  BMI 17.57 kg/m2  SpO2 100%  Physical Exam  Constitutional: He appears well-developed.  HENT:       Bulb for VP shunt to right temporal area.  Eyes: Pupils are equal, round, and reactive to light.  Neck: Neck supple.  Cardiovascular:       Tachycardia  Pulmonary/Chest: Effort normal.  Mildly harsh breath sounds  Abdominal: Soft. There is no tenderness.       PEG tube.  Musculoskeletal: He exhibits no edema.  Neurological: He is alert.       Somewhat difficult to understand. Chronic weakness on left side. Left facial droop is chronic  Skin: Skin is warm. No rash noted.    ED Course  Procedures (including critical care time)  Labs Reviewed  CBC WITH DIFFERENTIAL - Abnormal; Notable for the following:    RBC 4.19 (*)     Hemoglobin 12.4 (*)     HCT 37.4 (*)      Neutrophils Relative 85 (*)     Lymphocytes Relative 8 (*)     All other components within normal limits  URINALYSIS, ROUTINE W REFLEX MICROSCOPIC - Abnormal; Notable for the following:    APPearance HAZY (*)     All other components within normal limits  CG4 I-STAT (LACTIC ACID) - Abnormal; Notable for the following:    Lactic Acid, Venous 3.46 (*)     All other components within normal limits  POCT I-STAT, CHEM 8 - Abnormal; Notable for the following:    Calcium, Ion 1.27 (*)     Hemoglobin 12.9 (*)     HCT 38.0 (*)     All other components within normal limits  COMPREHENSIVE METABOLIC PANEL - Abnormal; Notable for the following:    AST 66 (*)     ALT 56 (*)     All other components within normal limits  PROCALCITONIN  MRSA PCR SCREENING  URINE CULTURE  CULTURE, BLOOD (ROUTINE X 2)  CULTURE, BLOOD (ROUTINE X 2)  INFLUENZA PANEL BY PCR  CBC  BASIC METABOLIC PANEL  LACTIC ACID, PLASMA  TSH  CLOSTRIDIUM DIFFICILE BY PCR  HIV ANTIBODY (ROUTINE TESTING)   Ct Head Wo Contrast  04/15/2012  *RADIOLOGY REPORT*  Clinical Data: Decreased level of consciousness.  Fever.  CT HEAD WITHOUT CONTRAST  Technique:  Contiguous axial images were obtained from the base of the skull through the vertex without contrast.  Comparison: 02/24/2011  Findings: There is no evidence of intracranial hemorrhage, brain edema or other signs of acute infarction.  There is no evidence of intracranial mass lesion or mass effect.  No abnormal extra-axial fluid collections are identified.  Ventricles are stable in size.  A right frontal ventricular shunt catheter is seen with tip remaining in the posterior third ventricle.  Associated right frontal lobe encephalomalacia is unchanged.  Multiple old lacunar infarcts involving the right basal ganglia and internal capsule are also unchanged.   No new intracranial findings are identified.  IMPRESSION:  1.  No acute intracranial findings. 2.  Stable ventricular size with  ventricular shunt catheter in stable position. 3.  Stable right frontal encephalomalacia and old right basal ganglia lacunes.   Original Report Authenticated By: Myles Rosenthal, M.D.    Dg Chest Port 1 View  (if Code Sepsis Called)  04/15/2012  *RADIOLOGY REPORT*  Clinical Data: Fever  PORTABLE CHEST - 1 VIEW  Comparison: 06/14/2011  Findings: Cardiomediastinal silhouette is stable.  No acute infiltrate or pleural effusion.  No pulmonary edema.  Right VP shunt catheter is unchanged in position.  IMPRESSION: No active disease.  No significant change.   Original Report Authenticated By: Natasha Mead, M.D.      1. Sepsis   2. 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 Dysphagia due to old stroke   4. Hypothyroidism   5. SIRS (systemic  inflammatory response syndrome)   6. VP (ventriculoperitoneal) shunt status    CRITICAL CARE Performed by: Billee Cashing   Total critical care time: 30  Critical care time was exclusive of separately billable procedures and treating other patients.  Critical care was necessary to treat or prevent imminent or life-threatening deterioration.  Critical care was time spent personally by me on the following activities: development of treatment plan with patient and/or surrogate as well as nursing, discussions with consultants, evaluation of patient's response to treatment, examination of patient, obtaining history from patient or surrogate, ordering and performing treatments and interventions, ordering and review of laboratory studies, ordering and review of radiographic studies, pulse oximetry and re-evaluation of patient's condition.   MDM  Patient with fever. No clear source found initially. Lactic acid is elevated. Patient remained somewhat tachycardic and with moderate blood pressure. Urinalysis does not show infection. Chest x-ray does not show infection. Patient will be admitted to Their primary care Dr. for further workup.      Juliet Rude.  Rubin Payor, MD 04/16/12 0130

## 2012-04-15 NOTE — ED Notes (Signed)
Report given to Armen Pickup, RN.

## 2012-04-15 NOTE — ED Notes (Signed)
Istat G4 lactic acid results handed to Dr.Pickering

## 2012-04-16 ENCOUNTER — Inpatient Hospital Stay (HOSPITAL_COMMUNITY): Payer: Medicare (Managed Care)

## 2012-04-16 DIAGNOSIS — R6510 Systemic inflammatory response syndrome (SIRS) of non-infectious origin without acute organ dysfunction: Secondary | ICD-10-CM

## 2012-04-16 DIAGNOSIS — J690 Pneumonitis due to inhalation of food and vomit: Secondary | ICD-10-CM | POA: Diagnosis present

## 2012-04-16 DIAGNOSIS — R1310 Dysphagia, unspecified: Secondary | ICD-10-CM

## 2012-04-16 DIAGNOSIS — E039 Hypothyroidism, unspecified: Secondary | ICD-10-CM

## 2012-04-16 DIAGNOSIS — E46 Unspecified protein-calorie malnutrition: Secondary | ICD-10-CM

## 2012-04-16 DIAGNOSIS — Z982 Presence of cerebrospinal fluid drainage device: Secondary | ICD-10-CM

## 2012-04-16 DIAGNOSIS — A419 Sepsis, unspecified organism: Secondary | ICD-10-CM

## 2012-04-16 DIAGNOSIS — I69991 Dysphagia following unspecified cerebrovascular disease: Secondary | ICD-10-CM

## 2012-04-16 DIAGNOSIS — M62838 Other muscle spasm: Secondary | ICD-10-CM

## 2012-04-16 LAB — BASIC METABOLIC PANEL
BUN: 16 mg/dL (ref 6–23)
CO2: 23 mEq/L (ref 19–32)
Chloride: 111 mEq/L (ref 96–112)
Glucose, Bld: 89 mg/dL (ref 70–99)
Potassium: 4.5 mEq/L (ref 3.5–5.1)

## 2012-04-16 LAB — CBC
HCT: 29.2 % — ABNORMAL LOW (ref 39.0–52.0)
Hemoglobin: 9.9 g/dL — ABNORMAL LOW (ref 13.0–17.0)
WBC: 11.4 10*3/uL — ABNORMAL HIGH (ref 4.0–10.5)

## 2012-04-16 LAB — CBC WITH DIFFERENTIAL/PLATELET
Basophils Relative: 0 % (ref 0–1)
HCT: 26.5 % — ABNORMAL LOW (ref 39.0–52.0)
Hemoglobin: 8.9 g/dL — ABNORMAL LOW (ref 13.0–17.0)
Lymphs Abs: 1.2 10*3/uL (ref 0.7–4.0)
MCHC: 33.6 g/dL (ref 30.0–36.0)
Monocytes Absolute: 0.7 10*3/uL (ref 0.1–1.0)
Monocytes Relative: 5 % (ref 3–12)
Neutro Abs: 11.3 10*3/uL — ABNORMAL HIGH (ref 1.7–7.7)
Neutrophils Relative %: 84 % — ABNORMAL HIGH (ref 43–77)
RBC: 3.01 MIL/uL — ABNORMAL LOW (ref 4.22–5.81)

## 2012-04-16 LAB — HIV ANTIBODY (ROUTINE TESTING W REFLEX): HIV: NONREACTIVE

## 2012-04-16 LAB — INFLUENZA PANEL BY PCR (TYPE A & B)
H1N1 flu by pcr: NOT DETECTED
Influenza A By PCR: NEGATIVE
Influenza B By PCR: NEGATIVE

## 2012-04-16 LAB — LACTIC ACID, PLASMA: Lactic Acid, Venous: 1.3 mmol/L (ref 0.5–2.2)

## 2012-04-16 LAB — MRSA PCR SCREENING: MRSA by PCR: NEGATIVE

## 2012-04-16 MED ORDER — VITAL 1.5 CAL PO LIQD
237.0000 mL | Freq: Four times a day (QID) | ORAL | Status: DC
  Administered 2012-04-16 – 2012-04-17 (×5): 237 mL
  Filled 2012-04-16 (×10): qty 237

## 2012-04-16 MED ORDER — SODIUM CHLORIDE 0.9 % IV SOLN
INTRAVENOUS | Status: DC
  Administered 2012-04-16: 125 mL/h via INTRAVENOUS

## 2012-04-16 MED ORDER — VITAL 1.5 CAL PO LIQD
1000.0000 mL | Freq: Four times a day (QID) | ORAL | Status: DC
  Filled 2012-04-16 (×5): qty 1000

## 2012-04-16 MED ORDER — VITAL 1.5 CAL PO LIQD: 1000.0000 mL | Freq: Four times a day (QID) | ORAL | Status: DC

## 2012-04-16 MED ORDER — LIDOCAINE HCL (PF) 1 % IJ SOLN
INTRAMUSCULAR | Status: AC
  Administered 2012-04-16: 5 mL via INTRADERMAL
  Filled 2012-04-16: qty 10

## 2012-04-16 MED ORDER — IOHEXOL 300 MG/ML  SOLN
80.0000 mL | Freq: Once | INTRAMUSCULAR | Status: AC | PRN
  Administered 2012-04-16: 80 mL via INTRAVENOUS

## 2012-04-16 MED ORDER — LIDOCAINE HCL (PF) 1 % IJ SOLN
10.0000 mL | Freq: Once | INTRAMUSCULAR | Status: AC
  Administered 2012-04-16: 5 mL via INTRADERMAL

## 2012-04-16 MED ORDER — CLINDAMYCIN HCL 300 MG PO CAPS
450.0000 mg | ORAL_CAPSULE | Freq: Three times a day (TID) | ORAL | Status: DC
  Administered 2012-04-16 – 2012-04-17 (×4): 450 mg via ORAL
  Filled 2012-04-16 (×8): qty 1

## 2012-04-16 MED ORDER — SODIUM CHLORIDE 0.9 % IV BOLUS (SEPSIS)
500.0000 mL | Freq: Once | INTRAVENOUS | Status: AC
  Administered 2012-04-16: 500 mL via INTRAVENOUS

## 2012-04-16 NOTE — Progress Notes (Signed)
Utilization review completed.  

## 2012-04-16 NOTE — Progress Notes (Signed)
MD paged about pts BP 89/52 and 87/53. MD Garald Braver responded and ordered a 500cc bolus. Will administer bolus and continue to monitor.  [place], [place], Charity fundraiser

## 2012-04-16 NOTE — Consult Note (Signed)
Reason for Consult: Fever of unknown origin ...ventricular shunt Referring Physician: [facility] hospital[facility]  [name] is an 56 y.o. male.  HPI: Patient 56 year old on whom many years ago underwent multiple infarcts and after a cocaine overdose this necessitated a shunt in place approximately 15 years ago most recently to revise with laparoscopic assistance and general surgery approximately 2 years ago. Patient presents to the emergency department with an episodic fever of 105 point something initial workup had revealed no source so we are consult with regard to rule out shunt infection the patient denied any meningismus denies any headache or photophobia any new neck stiffness of the spondylitic disease no difficulty with light spotting size. Workup in the ED concludes included on CBC with a normal white count but a left shift. Initial chest x-ray an additional workup was negative for source of his fever. The patient remained defervesced throughout the initial admission hours attempts were made to do a lumbar puncture at the bedside however due to spinal stenosis and potential scoliosis this was problematic. Currently the patient is awaiting an abdominal CT without contrast as well as potential intervention radiology and lumbar puncture for CSf.  Past Medical History  Diagnosis Date  . 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 VP (ventriculoperitoneal) shunt status 1996 and currently  . 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 Hypothyroidism   . Malnutrition     Has PEG tube un place  . Hyperlipidemia   . History of gout 4/13    right foot    Past Surgical History  Procedure Date  .  Ventriculoperitoneal shunt 1996, 2011    Has VP shunt in place as of 04/15/12  . Peg tube placement 09/2010    Has PEG tube in place as of 04/15/12    History reviewed. No pertinent family history.  Social History:  reports that he has been smoking Cigarettes.  He quit smokeless tobacco use about 8 years ago. He reports that he does not drink alcohol or use illicit drugs.  Allergies:  Allergies  Allergen Reactions  . Ibuprofen     Unknown  . Penicillins     Unknown    Medications: I have reviewed the patient's current medications.  Results for orders placed during the hospital encounter of 04/15/12 (from the past 48 hour(s))  URINALYSIS, ROUTINE W REFLEX MICROSCOPIC     Status: Abnormal   Collection Time   04/15/12  6:16 PM      Component Value Range Comment   Color, Urine YELLOW  YELLOW    APPearance HAZY (*) CLEAR    Specific Gravity, Urine 1.015  1.005 - 1.030    pH 5.5  5.0 - 8.0    Glucose, UA NEGATIVE  NEGATIVE mg/dL    Hgb urine dipstick NEGATIVE  NEGATIVE    Bilirubin Urine NEGATIVE  NEGATIVE    Ketones, ur NEGATIVE  NEGATIVE mg/dL    Protein, ur NEGATIVE  NEGATIVE mg/dL    Urobilinogen, UA 1.0  0.0 - 1.0 mg/dL    Nitrite NEGATIVE  NEGATIVE    Leukocytes, UA NEGATIVE  NEGATIVE MICROSCOPIC NOT DONE ON URINES WITH NEGATIVE PROTEIN, BLOOD, LEUKOCYTES, NITRITE, OR GLUCOSE <1000 mg/dL.  CBC WITH DIFFERENTIAL     Status: Abnormal   Collection Time   04/15/12  6:18 PM      Component Value Range Comment   WBC 8.6  4.0 - 10.5 K/uL    RBC 4.19 (*) 4.22 - 5.81 MIL/uL    Hemoglobin 12.4 (*) 13.0 - 17.0 g/dL    HCT 62.1 (*) 30.8 - 52.0 %    MCV 89.3  78.0 - 100.0 fL    MCH 29.6  26.0 - 34.0 pg    MCHC 33.2  30.0 - 36.0 g/dL    RDW 65.7  84.6 - 96.2 %    Platelets 232  150 - 400 K/uL    Neutrophils Relative 85 (*) 43 - 77 %    Neutro Abs 7.3  1.7 - 7.7 K/uL    Lymphocytes Relative 8 (*) 12 - 46 %    Lymphs Abs 0.7  0.7 - 4.0 K/uL    Monocytes Relative 6  3 - 12 %     Monocytes Absolute 0.5  0.1 - 1.0 K/uL    Eosinophils Relative 0  0 - 5 %    Eosinophils Absolute 0.0  0.0 - 0.7 K/uL    Basophils Relative 0  0 - 1 %    Basophils Absolute 0.0  0.0 - 0.1 K/uL   CG4 I-STAT (LACTIC ACID)     Status: Abnormal   Collection Time   04/15/12  6:28 PM      Component Value Range Comment   Lactic Acid, Venous 3.46 (*) 0.5 - 2.2 mmol/L   POCT I-STAT, CHEM 8     Status: Abnormal   Collection Time   04/15/12  6:31 PM      Component Value Range Comment   Sodium 142  135 - 145 mEq/L    Potassium 3.7  3.5 - 5.1 mEq/L    Chloride 108  96 - 112 mEq/L    BUN 23  6 - 23 mg/dL    Creatinine, Ser 9.52  0.50 - 1.35 mg/dL    Glucose, Bld 93  70 - 99 mg/dL    Calcium, Ion 8.41 (*) 1.12 - 1.23 mmol/L    TCO2 22  0 - 100 mmol/L    Hemoglobin 12.9 (*) 13.0 - 17.0 g/dL    HCT 32.4 (*) 40.1 - 52.0 %   COMPREHENSIVE METABOLIC PANEL     Status: Abnormal   Collection Time   04/15/12  6:35 PM      Component Value Range Comment   Sodium 142  135 - 145 mEq/L    Potassium 3.9  3.5 - 5.1 mEq/L    Chloride 105  96 - 112 mEq/L    CO2 23  19 - 32 mEq/L    Glucose, Bld 90  70 - 99 mg/dL    BUN 21  6 - 23 mg/dL    Creatinine, Ser 0.27  0.50 - 1.35 mg/dL    Calcium 25.3  8.4 - 10.5 mg/dL    Total Protein 7.4  6.0 - 8.3 g/dL    Albumin 3.9  3.5 - 5.2 g/dL    AST 66 (*) 0 - 37 U/L    ALT 56 (*) 0 - 53 U/L    Alkaline Phosphatase 68  39 - 117 U/L    Total Bilirubin 0.6  0.3 - 1.2 mg/dL    GFR calc non Af Amer >90  >90 mL/min    GFR calc Af Amer >90  >90 mL/min  PROCALCITONIN     Status: Normal   Collection Time   04/15/12  6:35 PM      Component Value Range Comment   Procalcitonin 1.03     MRSA PCR SCREENING     Status: Normal   Collection Time   04/15/12 11:20 PM      Component Value Range Comment   MRSA by PCR NEGATIVE  NEGATIVE   LACTIC ACID, PLASMA     Status: Normal   Collection Time   04/16/12  4:30 AM      Component Value Range Comment   Lactic Acid, Venous 1.3  0.5 -  2.2 mmol/L   CBC     Status: Abnormal   Collection Time   04/16/12  4:50 AM      Component Value Range Comment   WBC 11.4 (*) 4.0 - 10.5 K/uL    RBC 3.27 (*) 4.22 - 5.81 MIL/uL    Hemoglobin 9.9 (*) 13.0 - 17.0 g/dL    HCT 40.9 (*) 81.1 - 52.0 %    MCV 89.3  78.0 - 100.0 fL    MCH 30.3  26.0 - 34.0 pg    MCHC 33.9  30.0 - 36.0 g/dL    RDW 91.4  78.2 - 95.6 %    Platelets 196  150 - 400 K/uL   BASIC METABOLIC PANEL     Status: Abnormal   Collection Time   04/16/12  4:50 AM      Component Value Range Comment   Sodium 141  135 - 145 mEq/L    Potassium 4.5  3.5 - 5.1 mEq/L    Chloride 111  96 - 112 mEq/L    CO2 23  19 - 32 mEq/L    Glucose, Bld 89  70 - 99 mg/dL    BUN 16  6 - 23 mg/dL    Creatinine, Ser 2.13  0.50 - 1.35 mg/dL    Calcium 8.3 (*) 8.4 - 10.5 mg/dL    GFR calc non Af Amer >90  >90 mL/min    GFR calc Af Amer >90  >90 mL/min   TSH     Status: Normal   Collection Time   04/16/12  4:50 AM      Component Value Range Comment   TSH 0.924  0.350 - 4.500 uIU/mL   INFLUENZA PANEL BY PCR     Status: Normal   Collection Time   04/16/12  6:04 AM      Component Value Range Comment   Influenza A By PCR NEGATIVE  NEGATIVE    Influenza B By PCR NEGATIVE  NEGATIVE    H1N1 flu by pcr NOT DETECTED  NOT DETECTED     Ct Head Wo Contrast  04/15/2012  *RADIOLOGY REPORT*  Clinical Data: Decreased level of consciousness.  Fever.  CT HEAD WITHOUT CONTRAST  Technique:  Contiguous axial images were obtained from the base of the skull through the vertex without contrast.  Comparison: 02/24/2011  Findings: There is no evidence of intracranial hemorrhage, brain edema or other signs of acute infarction.  There is no evidence of intracranial mass lesion or mass effect.  No abnormal extra-axial fluid collections are identified.  Ventricles are stable in size.  A right frontal ventricular shunt catheter is seen with tip remaining in the posterior third ventricle.  Associated right frontal lobe  encephalomalacia is unchanged.  Multiple old lacunar infarcts involving the right basal ganglia and internal capsule are also unchanged.   No new intracranial  findings are identified.  IMPRESSION:  1.  No acute intracranial findings. 2.  Stable ventricular size with ventricular shunt catheter in stable position. 3.  Stable right frontal encephalomalacia and old right basal ganglia lacunes.   Original Report Authenticated By: Myles Rosenthal, M.D.    Ct Abdomen Pelvis W Contrast  04/16/2012  *RADIOLOGY REPORT*  Clinical Data: Fever.  Question VP shunt infection.  CT ABDOMEN AND PELVIS WITH CONTRAST  Technique:  Multidetector CT imaging of the abdomen and pelvis was performed following the standard protocol during bolus administration of intravenous contrast.  Contrast: 80mL OMNIPAQUE IOHEXOL 300 MG/ML  SOLN  Comparison: 02/24/2011  Findings: Lung bases:  Patchy right upper lobe and lower lobe airspace disease, suspicious for infection.  Left base dependent atelectasis.  Normal heart size.  Trace bilateral pleural effusions.  Abdomen/pelvis:  Trace perihepatic ascites.  Minimal motion degradation throughout.  Normal liver, spleen.  A small hiatal hernia.  Gastrostomy tube within the stomach.  The proximal stomach appears thick-walled, favored to be due to underdistension.  Normal pancreas, biliary tract, adrenal glands.  Possible tiny gallstone on image 28/series 2.  Interpolar too small to characterize right renal lesion.  Mild nonspecific symmetric perirenal edema.  No retroperitoneal or retrocrural adenopathy. Normal colon and terminal ileum.  Appendix is not visualized but there is no evidence of right lower quadrant inflammation.  Normal small bowel.  The right-sided VP shunt catheter enters the right side of the abdomen well below the level of the perihepatic ascites.  There is no evidence of edema or fluid along the catheter tract.  The catheter terminates in the right hemi pelvis on image 58, without surrounding  fluid collection.  No pelvic adenopathy.    Normal urinary bladder and prostate.  No significant free fluid.  Bones/Musculoskeletal:  No acute osseous abnormality.  Transitional S1 vertebral body.  L2 Schmorl's node defect versus minimal superior endplate compression deformity.  Trace lumbosacral malalignment.  IMPRESSION:  1.  No evidence of shunt infection. 2.  Right base air space disease, suspicious for infection or aspiration. 3.  Trace bilateral pleural effusions and minimal perihepatic ascites. 4.  Possible cholelithiasis.   Original Report Authenticated By: Jeronimo Greaves, M.D.    Dg Chest Port 1 View  (if Code Sepsis Called)  04/15/2012  *RADIOLOGY REPORT*  Clinical Data: Fever  PORTABLE CHEST - 1 VIEW  Comparison: 06/14/2011  Findings: Cardiomediastinal silhouette is stable.  No acute infiltrate or pleural effusion.  No pulmonary edema.  Right VP shunt catheter is unchanged in position.  IMPRESSION: No active disease.  No significant change.   Original Report Authenticated By: Natasha Mead, M.D.     @ROS @ Blood pressure 101/57, pulse 83, temperature 98.5 F (36.9 C), temperature source Oral, resp. rate 21, height 5\' 9"  (1.753 m), weight 56.3 kg (124 lb 1.9 oz), SpO2 99.00%. Patient is awake alert she's oriented he is not complaining of any headache or significant neck stiffness or no photophobia baseline significant cognitive deficits from his multiple strokes he does move both upper and lower extremities symmetrically about 4+ out of 5 strength  Assessment/Plan: 56 year old gentleman with a shunt last revised 2 years ago with a fever of unknown origin. I think very unlikely this represents a shunt infection very rare crease to get infected 2 years after placement in adults in addition he has no signs of meningismus or problems in a fever 105 seems very high for an additional shunt infection. I would recommend continued workup of his fever  I. would not recommend In a shunt this could possibly  introduce infection with systemic bacteremia I would recommend if CSF was felt that is needed to rule out meningitis to continue lumbar puncture per interventional radiology. I agree with the CT scan Z. and pelvis without contrast and defer to IV pain medicine with regard to distal fever workup. I will discuss this with Dr. Venetia Maxon who is his shunt Dr.  Donalee Citrin P 04/16/2012, 10:26 AM

## 2012-04-16 NOTE — H&P (Signed)
Internal Medicine Teaching Service Attending Note Date: 04/16/2012  Patient name: [name]  Medical record number: [id number]  Date of birth: [birth year]    This patient has been seen and discussed with the house staff. Please see their note for complete details. I concur with their findings with the following additions/corrections: Patient is a 56 year old man with past medical history significant of hemorrhage stroke in 1996 secondary to cocaine overdose. Patient is status post VP shunt placement, PEG tube placement and recent urinary tract infection. Patient was brought in to [facility] for chief complaints of fever and cough. Patient has had cough for about 2 weeks. He brings up brown colored sputum every time he coughs. Cough is associated with fever and chills. Patient was noted to have a fever of 105. Patient admits that although he is medically restricted to eat anything from his mouth, he ended up having a chocolate cake on Thanksgiving which is within he started having cough.  Review of systems is negative except what is noted above and is as per resident's note.  Patient's home medications, allergies, past medical history, past surgical history, social history and family history was reviewed. Please see the resident's note for details.  BP 101/57  Pulse 83  Temp 98.5 F (36.9 C) (Oral)  Resp 21  Ht 5\' 9"  (1.753 m)  Wt 124 lb 1.9 oz (56.3 kg)  BMI 18.33 kg/m2  SpO2 99% He is alert cooperative and appears in no distress. Patient's physical exam is consistent with abnormalities in his eyes specifically his pupil is constricted in the left eye. Patient also has left eye deviation. Lungs are clear to auscultation bilaterally with decreased breathing at the bases S1-S2 is normal, no murmurs rubs or gallops, regular rate and rhythm Abdomen is soft nontender and bowel sounds active in all 4 quadrants, PEG tube placement noted Patient has right leg contracture with limited range  of motion and atrophy of muscles in the right leg Patient has decreased strength in the right lower extremity, sensations are intact throughout  The patient's labs, imaging studies and EKG was reviewed.  EKG suggests ST depression in inferior leads and ST depressions in lead V3, V4, V5. These changes are new as compared to previous EKG.  Assessment and plan: Patient is a 56 year old man with past medical history of neurological deficit currently post VP shunt, PEG tube who comes in with chief complaints of cough with sputum production and high-grade fevers. The differential for fevers and cough with sputum production could be large.  Given that patient is not confused at this time and physical exam does not elicit signs of meningismus, I would not pursue the diagnosis of meningitis anymore(lumbar puncture tried on the night of admission but unsuccessful). Differentials include pneumonia especially aspiration since patient admits to eating chocolate cake. Inputs from pulmonary critical care and neurosurgery of much appreciated. We will start the patient on broad-spectrum antibiotics for aspiration pneumonia and follow him along.  Rest of the medical management as per resident's note.    Lars Mage 04/16/2012, 2:59 PM

## 2012-04-16 NOTE — Progress Notes (Addendum)
INITIAL ADULT NUTRITION ASSESSMENT Date: 04/16/2012   Time: 10:27 AM  INTERVENTION:  Bolus EN regimen via PEG tube: Vital 1.5 formula 5 cans per day (2 cans at 0630 and 1 can at 1100, 1500, & 1900) to provide 1800 total kcals, 81 gm protein, 920 ml of free water  Consider Speech Path consult for swallow evaluation RD to follow for nutrition care plan  DOCUMENTATION CODES Per approved criteria  -Underweight   Reason for Assessment: Consult ---> EN initiation & management  ASSESSMENT: Male 56 y.o.  Dx: SIRS (systemic inflammatory response syndrome)  Hx:  Past Medical History  Diagnosis Date  . 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 VP (ventriculoperitoneal) shunt status 1996 and currently  . 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 Hypothyroidism   . Malnutrition     Has PEG tube un place  . Hyperlipidemia   . History of gout 4/13    right foot    Related Meds:     . [COMPLETED] sodium chloride  1,000 mL Intravenous Once   Followed by  . [COMPLETED] sodium chloride  1,000 mL Intravenous Once  . [COMPLETED] aztreonam  2 g Intravenous To ER  . enoxaparin (LOVENOX) injection  40 mg Subcutaneous Daily  . [COMPLETED] levofloxacin (LEVAQUIN) IV  750 mg Intravenous Once  . levothyroxine  75 mcg Per Tube QAC breakfast  . [COMPLETED] lidocaine  10 mL Intradermal Once  . multivitamin  15 mL Per Tube Daily  . [COMPLETED] sodium chloride  1,000 mL Intravenous Once  . [COMPLETED] sodium chloride  500 mL Intravenous Once  . [COMPLETED] vancomycin  1,000 mg Intravenous Once  . [DISCONTINUED] aztreonam  2 g Intravenous Once  . [DISCONTINUED] aztreonam  2 g Intravenous Q8H  .  [DISCONTINUED] imipenem-cilastatin  250 mg Intravenous Q6H  . [DISCONTINUED] levofloxacin (LEVAQUIN) IV  750 mg Intravenous Q24H  . [DISCONTINUED] vancomycin  750 mg Intravenous Q12H    Ht: 5\' 9"  (175.3 cm)  Wt: 124 lb 1.9 oz (56.3 kg)  Ideal Wt: 72.7 kg % Ideal Wt: 77%  Usual Wt: 57.2 kg % Usual Wt: 98%  Body mass index is 18.33 kg/(m^2).  Food/Nutrition Related Hx: admission nutrition screen incomplete  Labs:  CMP     Component Value Date/Time   NA 141 04/16/2012 0450   K 4.5 04/16/2012 0450   CL 111 04/16/2012 0450   CO2 23 04/16/2012 0450   GLUCOSE 89 04/16/2012 0450   BUN 16 04/16/2012 0450   CREATININE 0.81 04/16/2012 0450   CALCIUM 8.3* 04/16/2012 0450   PROT 7.4 04/15/2012 1835   ALBUMIN 3.9 04/15/2012 1835   AST 66* 04/15/2012 1835   ALT 56* 04/15/2012 1835   ALKPHOS 68 04/15/2012 1835   BILITOT 0.6 04/15/2012 1835   GFRNONAA >90 04/16/2012 0450   GFRAA >90 04/16/2012 0450     Intake/Output Summary (Last 24 hours) at 04/16/12 1027 Last data filed at 04/16/12 0800  Gross per 24 hour  Intake   1725 ml  Output    701 ml  Net   1024 ml    Diet Order: NPO  Supplements/Tube Feeding: Liquid MVI via tube  IVF:    sodium chloride  Last Rate: 150 mL/hr at 04/16/12 0551  [DISCONTINUED] sodium chloride     Estimated Nutritional Needs:   Kcal: 1700-1900 Protein: 80-90 gm  Fluid: 1.7-1.9 L  [facility] [place]; came in to the [facility] ED with fever and complaints of a cough productive of "balls" of sputum for almost 2 weeks now; RD spoke with patient's Aunt who reports he receives 5 cans of Vital 1.5 formula via PEG tube (2 cans at 0630 and 1 can at 1100, 1500, & 1900); no weight loss reported; also spoke with RN from [facility] who reports has been working with SLP and currently not taking anything by mouth -- RD to place EN regimen orders.  NUTRITION DIAGNOSIS: -Swallowing difficulty (NI-1.1).  Status: Ongoing  RELATED TO: chronic dysphagia  AS  EVIDENCE BY: home EN support  MONITORING/EVALUATION(Goals): Goal: EN to meet >/= 90% of estimated nutrition needs Monitor: EN regimen & tolerance, weight, labs, I/O's  EDUCATION NEEDS: -No education needs identified at this time  Kirkland Hun, RD, LDN Pager #: [phone] After-Hours Pager #: [phone]

## 2012-04-16 NOTE — Procedures (Signed)
[facility] PCCM  Procedure note: Lumbar Puncture Attempt  The patient's family provided informed consent after discussion of the risks and benefits, see written chart.  The patient was positioned in the lateral decubitus position and his skin was cleaned with chlorhexadine.  Sterile precautions were used.  Two interspinal spaces were anesthestized with 1% Lidocaine (4cc).  I attempted to enter the epidural space but was never able to obtain fluid as the needle consistently hit bone.  The patient was generally cooperative with the procedure.  After multiple failed attempts I aborted the procedure and discussed with the primary service.  If a lumbar puncture is desired, would consult IR.  Otherwise would ask NSGY to access his shunt for fluid.    No immediate complications.  Yolonda Kida PCCM Pager: [phone] Cell: [phone] If no response, call [phone]

## 2012-04-16 NOTE — Progress Notes (Signed)
Subjective: Patient denies h/a, chills, chest pain, shortness of breath.  He denies falling prior to admission.  He reports dry cough and having a cold for 2 weeks.  He has been taking NyQuil and DayQuil at home.  He is oriented to place, his dob, and the year.  The [facility] nurse is there talking to his aunt.  [facility] nurse informs me patient has trouble with dysphagia since stroke he is supposed to be NPO feedings only per tube but aunt states he ate a piece of chocolate cake for Thanksgiving.   Objective: Vital signs in last 24 hours: Filed Vitals:   04/16/12 0741 04/16/12 1231 04/16/12 1538 04/16/12 1539  BP: 101/57   104/52  Pulse: 83  83 77  Temp:  98.5 F (36.9 C)  98.5 F (36.9 C)  TempSrc:  Oral  Oral  Resp:    14  Height:      Weight:      SpO2: 99%  98% 98%   Weight change:   Intake/Output Summary (Last 24 hours) at 04/16/12 1623 Last data filed at 04/16/12 1600  Gross per 24 hour  Intake   2925 ml  Output   2151 ml  Net    774 ml   General: lying in bed, nad, alert and oriented x 3 HEENT: perrl b/l though left pupil more constricted than the right CV: RRR no rubs, murmurs or gallops Lung: ctab Abdomen: soft, tube intact without drainage, ntnd, +normal bowel sounds Extremities: thin, warm, no cyanosis or edema  Neuro: deficits s/p stroke in CN 2; able to move all 4 extremities; atrophy of muscles  Skin: right forehead scar, some excoriations to back otherwise intact.   Lab Results: Basic Metabolic Panel:  Lab 04/16/12 7829 04/15/12 1835  NA 141 142  K 4.5 3.9  CL 111 105  CO2 23 23  GLUCOSE 89 90  BUN 16 21  CREATININE 0.81 0.88  CALCIUM 8.3* 10.4  MG -- --  PHOS -- --   Liver Function Tests:  Lab 04/15/12 1835  AST 66*  ALT 56*  ALKPHOS 68  BILITOT 0.6  PROT 7.4  ALBUMIN 3.9   CBC:  Lab 04/16/12 0450 04/15/12 1831 04/15/12 1818  WBC 11.4* -- 8.6  NEUTROABS -- -- 7.3  HGB 9.9* 12.9* --  HCT 29.2* 38.0* --  MCV 89.3 -- 89.3  PLT 196 -- 232    Thyroid Function Tests:  Lab 04/16/12 0450  TSH 0.924  T4TOTAL --  FREET4 --  T3FREE --  THYROIDAB --     Urinalysis:  Lab 04/15/12 1816  COLORURINE YELLOW  LABSPEC 1.015  PHURINE 5.5  GLUCOSEU NEGATIVE  HGBUR NEGATIVE  BILIRUBINUR NEGATIVE  KETONESUR NEGATIVE  PROTEINUR NEGATIVE  UROBILINOGEN 1.0  NITRITE NEGATIVE  LEUKOCYTESUR NEGATIVE   Misc. Labs:pending blood and urine cultures   Micro Results: Recent Results (from the past 240 hour(s))  MRSA PCR SCREENING     Status: Normal   Collection Time   04/15/12 11:20 PM      Component Value Range Status Comment   MRSA by PCR NEGATIVE  NEGATIVE Final   CLOSTRIDIUM DIFFICILE BY PCR     Status: Normal   Collection Time   04/16/12  7:31 AM      Component Value Range Status Comment   C difficile by pcr NEGATIVE  NEGATIVE Final    Studies/Results: Ct Head Wo Contrast  04/15/2012  *RADIOLOGY REPORT*  Clinical Data: Decreased level of consciousness.  Fever.  CT HEAD WITHOUT CONTRAST  Technique:  Contiguous axial images were obtained from the base of the skull through the vertex without contrast.  Comparison: 02/24/2011  Findings: There is no evidence of intracranial hemorrhage, brain edema or other signs of acute infarction.  There is no evidence of intracranial mass lesion or mass effect.  No abnormal extra-axial fluid collections are identified.  Ventricles are stable in size.  A right frontal ventricular shunt catheter is seen with tip remaining in the posterior third ventricle.  Associated right frontal lobe encephalomalacia is unchanged.  Multiple old lacunar infarcts involving the right basal ganglia and internal capsule are also unchanged.   No new intracranial findings are identified.  IMPRESSION:  1.  No acute intracranial findings. 2.  Stable ventricular size with ventricular shunt catheter in stable position. 3.  Stable right frontal encephalomalacia and old right basal ganglia lacunes.   Original Report Authenticated By:  Myles Rosenthal, M.D.    Ct Abdomen Pelvis W Contrast  04/16/2012  *RADIOLOGY REPORT*  Clinical Data: Fever.  Question VP shunt infection.  CT ABDOMEN AND PELVIS WITH CONTRAST  Technique:  Multidetector CT imaging of the abdomen and pelvis was performed following the standard protocol during bolus administration of intravenous contrast.  Contrast: 80mL OMNIPAQUE IOHEXOL 300 MG/ML  SOLN  Comparison: 02/24/2011  Findings: Lung bases:  Patchy right upper lobe and lower lobe airspace disease, suspicious for infection.  Left base dependent atelectasis.  Normal heart size.  Trace bilateral pleural effusions.  Abdomen/pelvis:  Trace perihepatic ascites.  Minimal motion degradation throughout.  Normal liver, spleen.  A small hiatal hernia.  Gastrostomy tube within the stomach.  The proximal stomach appears thick-walled, favored to be due to underdistension.  Normal pancreas, biliary tract, adrenal glands.  Possible tiny gallstone on image 28/series 2.  Interpolar too small to characterize right renal lesion.  Mild nonspecific symmetric perirenal edema.  No retroperitoneal or retrocrural adenopathy. Normal colon and terminal ileum.  Appendix is not visualized but there is no evidence of right lower quadrant inflammation.  Normal small bowel.  The right-sided VP shunt catheter enters the right side of the abdomen well below the level of the perihepatic ascites.  There is no evidence of edema or fluid along the catheter tract.  The catheter terminates in the right hemi pelvis on image 58, without surrounding fluid collection.  No pelvic adenopathy.    Normal urinary bladder and prostate.  No significant free fluid.  Bones/Musculoskeletal:  No acute osseous abnormality.  Transitional S1 vertebral body.  L2 Schmorl's node defect versus minimal superior endplate compression deformity.  Trace lumbosacral malalignment.  IMPRESSION:  1.  No evidence of shunt infection. 2.  Right base air space disease, suspicious for infection or  aspiration. 3.  Trace bilateral pleural effusions and minimal perihepatic ascites. 4.  Possible cholelithiasis.   Original Report Authenticated By: Jeronimo Greaves, M.D.    Dg Chest Port 1 View  (if Code Sepsis Called)  04/15/2012  *RADIOLOGY REPORT*  Clinical Data: Fever  PORTABLE CHEST - 1 VIEW  Comparison: 06/14/2011  Findings: Cardiomediastinal silhouette is stable.  No acute infiltrate or pleural effusion.  No pulmonary edema.  Right VP shunt catheter is unchanged in position.  IMPRESSION: No active disease.  No significant change.   Original Report Authenticated By: Natasha Mead, M.D.    Medications:  Scheduled Meds:    . [COMPLETED] sodium chloride  1,000 mL Intravenous Once   Followed by  . [COMPLETED] sodium chloride  1,000 mL  Intravenous Once  . [COMPLETED] aztreonam  2 g Intravenous To ER  . clindamycin  450 mg Oral Q8H  . enoxaparin (LOVENOX) injection  40 mg Subcutaneous Daily  . feeding supplement (VITAL 1.5 CAL)  237 mL Per Tube QID  . [COMPLETED] levofloxacin (LEVAQUIN) IV  750 mg Intravenous Once  . levothyroxine  75 mcg Per Tube QAC breakfast  . [COMPLETED] lidocaine  10 mL Intradermal Once  . multivitamin  15 mL Per Tube Daily  . [COMPLETED] sodium chloride  1,000 mL Intravenous Once  . [COMPLETED] sodium chloride  500 mL Intravenous Once  . [COMPLETED] vancomycin  1,000 mg Intravenous Once  . [DISCONTINUED] aztreonam  2 g Intravenous Once  . [DISCONTINUED] aztreonam  2 g Intravenous Q8H  . [DISCONTINUED] feeding supplement (VITAL 1.5 CAL)  1,000 mL Per Tube QID  . [DISCONTINUED] feeding supplement (VITAL 1.5 CAL)  1,000 mL Per Tube QID  . [DISCONTINUED] imipenem-cilastatin  250 mg Intravenous Q6H  . [DISCONTINUED] levofloxacin (LEVAQUIN) IV  750 mg Intravenous Q24H  . [DISCONTINUED] vancomycin  750 mg Intravenous Q12H   Continuous Infusions:    . sodium chloride    . [DISCONTINUED] sodium chloride    . [DISCONTINUED] sodium chloride 150 mL/hr at 04/16/12 0551   PRN  Meds:.acetaminophen, [COMPLETED] iohexol, ondansetron (ZOFRAN) IV, ondansetron Assessment/Plan: 56 year old male presenting with fever of unknown origin, tachycardia, tachypnea, and hypotension to the ED 04/15/12.  Baseline blood pressure is 90s/60s.  He had and anion gap on admission of 14.    1. Sepsis secondary to aspiration pneumonia -Initially patient had fever of unknown origin.  C. Diff, HIV and flu were negative. UA was not suspcious for UTI.  Blood and urine cultures are still pending. Due to concern for VP shunt infection LP was attempted by CCM but unsuccessful.  Neurosurgery was consulted (Dr. Wynetta Emery) who thought VP shunt infection was unlikely as the patient has had the shunt for two years and is without signs of meningismus.  At this time will hold on further LP studies.  We performed at CT head which showed stable changes right frontal encephalomalacia and old right basal ganglia lacunes.  CT abdomen/pelvis showed right upper and lower lob airspace disease suspicious for infection.  Left base atelectasis. Trace bilateral pleural effusions, trace perihepatic ascites, possible cholelithiasis.  CXR was normal.  He had SIRS criteria this admission with fever, leukocytosis, tachycardia, tachypnea.  He also had an elevated lactic acid 3.46 which trended down to 1.3.  He has had dysphasia since his stroke but still intermittently eating oral food though he should be NPO and per tube feeds    Plan -Continue to monitor VS, trend CBC, -started Clindamycin 450 mg tid x 7-10 days to treat for aspiration pneumonia  -Follow up blood cultures -Continue IVF 125 cc/hr    2. Hypothyroidism  -tsh 0.924  -Continue synthroid 75 mg daily   3. Status post intracranial bleed/hemorrhagic stroke secondary to cocaine abuse  -With deficits left pupil constriction which does constrict minimally with light, right hemiparesis aphasia, dysphagia, contractures, dysconjugate gaze, visual field cuts, atrophy of muscles.  He also has dysphagia due to old stroke.     -He is improving with PT/OT ordered to work with him. He baseline walks with a walker at home   4. DVT Px  -Lovenox   5. F/E/N -IVF 125 cc/hr -will monitor daily labs  -tube feeds only per Nutrition recs    Dispo: Disposition is deferred at this time,  awaiting improvement of current medical problems.  Anticipated discharge in approximately 1-2 day(s).   The patient does have a current PCP (REESE,BETTI D, MD), therefore will be requiring OPC follow-up after discharge.  He will need follow up with Dorothe Pea.    The patient does have transportation limitations that hinder transportation to clinic appointments.  .Services Needed at time of discharge: Y = Yes, Blank = No PT:   OT:   RN:   Equipment:   Other:     LOS: 1 day   Annett Gula [phone] 04/16/2012, 4:23 PM

## 2012-04-16 NOTE — Consult Note (Signed)
PULMONARY  / CRITICAL CARE MEDICINE  Name: [name] MRN: [id number] DOB: March 12, 1956    LOS: 1  REFERRING MD :  Internal Medicine  CHIEF COMPLAINT:  Fever  BRIEF PATIENT DESCRIPTION: 56 y/o male with a VP shunt in place after a hemorrhagic stroke in 1996 was admitted on 04/15/2012 with a fever of 105.  PCCM asked for consult and for assistance with an LP.  LINES / TUBES: peripheral  CULTURES: 12/04 blood >> 12/04 c.diff >> 12/04 urine >>   ANTIBIOTICS: 12/04 vanc >> 12/04 imipenem >>   SIGNIFICANT EVENTS:  12/04 LP >>  LEVEL OF CARE:  SDU PRIMARY SERVICE:  IM Teaching CONSULTANTS:  PCCM, NSGY CODE STATUS: Full  DIET:  NPO DVT Px:  lovenox GI Px:  n/a  HISTORY OF PRESENT ILLNESS:  56 y/o male with a VP shunt in place after a hemorrhagic stroke in 1996 was admitted on 04/15/2012 with a fever of 105.  PCCM asked for consult and for assistance with an LP.  He reported coughing up sputum for two weeks in the ED, though when I ask him he states that he has not had cough, abdominal pain, headache, neck stiffness, dysuria, or diarrhea.  He just states that he knew he had a fever to 105. Apparently he was recently treated for a UTI.   PAST MEDICAL HISTORY :  Past Medical History  Diagnosis Date  . 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 VP (ventriculoperitoneal) shunt status 1996 and currently  . 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 Hypothyroidism   . Malnutrition     Has PEG tube un place  . Hyperlipidemia   . History of gout 4/13    right foot   Past Surgical History  Procedure Date  . Ventriculoperitoneal shunt 1996, 2011    Has VP shunt in place as of  04/15/12  . Peg tube placement 09/2010    Has PEG tube in place as of 04/15/12   Prior to Admission medications   Medication Sig Start Date End Date Taking? Authorizing Provider  acetaminophen (TYLENOL) 325 MG tablet Give 650 mg by tube every 4 (four) hours as needed. For pain   Yes Historical Provider, MD  guaifenesin (ROBITUSSIN) 100 MG/5ML syrup Take 200 mg by mouth every 4 (four) hours as needed. For cough   Yes Historical Provider, MD  levothyroxine (SYNTHROID, LEVOTHROID) 75 MCG tablet 75 mcg by PEG Tube route daily.   Yes Historical Provider, MD  Multiple Vitamin (MULTIVITAMIN) LIQD Give 15 mLs by tube daily.   Yes Historical Provider, MD   Allergies  Allergen Reactions  . Ibuprofen     Unknown  . Penicillins     Unknown    FAMILY HISTORY:  History reviewed. No pertinent family history. SOCIAL HISTORY:  reports that he has been smoking Cigarettes.  He quit smokeless tobacco use about 8 years ago. He reports that he does not drink alcohol or use illicit drugs.  REVIEW OF SYSTEMS:   Gen: Notes fever, but denies chills, weight change, fatigue, night sweats HEENT: Denies blurred vision, double vision, hearing loss, tinnitus, sinus congestion, rhinorrhea, sore throat, neck stiffness, dysphagia PULM: Denies shortness of  breath, cough, sputum production, hemoptysis, wheezing CV: Denies chest pain, edema, orthopnea, paroxysmal nocturnal dyspnea, palpitations GI: Denies abdominal pain, nausea, vomiting, diarrhea, hematochezia, melena, constipation, change in bowel habits GU: Denies dysuria, hematuria, polyuria, oliguria, urethral discharge Endocrine: Denies hot or cold intolerance, polyuria, polyphagia or appetite change Derm: Denies rash, dry skin, scaling or peeling skin change Heme: Denies easy bruising, bleeding, bleeding gums Neuro: Denies headache, numbness, weakness, slurred speech, loss of memory or consciousness    INTERVAL HISTORY:   VITAL SIGNS: Temp:  [101 F (38.3  C)-105.4 F (40.8 C)] 101 F (38.3 C) (12/04 2208) Pulse Rate:  [94-118] 95  (12/04 2230) Resp:  [19-31] 24  (12/04 2230) BP: (89-96)/(54-62) 89/54 mmHg (12/04 2230) SpO2:  [94 %-100 %] 100 % (12/04 2230) Weight:  [53.978 kg (119 lb)] 53.978 kg (119 lb) (12/04 1855) HEMODYNAMICS:   VENTILATOR SETTINGS:   INTAKE / OUTPUT: Intake/Output    None     PHYSICAL EXAMINATION: Gen: no acute distress HEENT: PERRL, persistent rightward gaze, VP shunt site without redness, warmth, tenderness PULM: few crackles R base that clear with deep breath CV: RRR, no mgr, no JVD AB: BS+, soft, nontender, no hsm Ext: warm, no edema, no clubbing, no cyanosis Derm: no rash or skin breakdown Neuro: Awake and alert, oriented to situation, [facility], cooperative, follows commands    LABS: Cbc  Lab 04/15/12 1831 04/15/12 1818  WBC -- 8.6  HGB 12.9* 12.4*  HCT 38.0* 37.4*  PLT -- 232    Chemistry   Lab 04/15/12 1835 04/15/12 1831  NA 142 142  K 3.9 3.7  CL 105 108  CO2 23 --  BUN 21 23  CREATININE 0.88 1.00  CALCIUM 10.4 --  MG -- --  PHOS -- --  GLUCOSE 90 93    Liver fxn  Lab 04/15/12 1835  AST 66*  ALT 56*  ALKPHOS 68  BILITOT 0.6  PROT 7.4  ALBUMIN 3.9   coags No results found for this basename: APTT:3,INR:3 in the last 168 hours Sepsis markers  Lab 04/15/12 1835 04/15/12 1828  LATICACIDVEN -- 3.46*  PROCALCITON 1.03 --   Cardiac markers No results found for this basename: CKTOTAL:3,CKMB:3,TROPONINI:3 in the last 168 hours BNP No results found for this basename: PROBNP:3 in the last 168 hours ABG  Lab 04/15/12 1831  PHART --  PCO2ART --  PO2ART --  HCO3 --  TCO2 22    CBG trend No results found for this basename: GLUCAP:5 in the last 168 hours  IMAGING:  ECG: Sinus tach, ant/lateral ST depression (lead I, V3-4)  DIAGNOSES: Principal Problem:  *SIRS (systemic inflammatory response syndrome) Active Problems:  Hypothyroidism  VP  (ventriculoperitoneal) shunt status  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  Unlikely ischaemia given no chest pain and similar to 2012 study PLAN:  -continue IVF -repeat Lactic acid to ensure clearance (ideally want 20% clearance with resuscitation in first two hours) -repeat EKG re: ST changes   RENAL  ASSESSMENT:   1) No acute issues PLAN:   -follow UOP -IVF  GASTROINTESTINAL  ASSESSMENT:   1) dysphagia PLAN:   -regular diet/tube feedings  HEMATOLOGIC  ASSESSMENT:  1) No acute issues PLAN:  -monitor for bleeding  INFECTIOUS  ASSESSMENT:   1) Fever/SIRS of uncertain etiology; CXR, U/A normal; ddx includes viral prodrome (most likely) vs less likely an infected VP shunt vs meningitis vs c.diff (no diarrhea) PLAN:   -LP -agree with current work up, however without a clear source of infection would consider holding broad spectrum antibiotics soon if fever improves and no other clear source of infection identified -apap prn fever  ENDOCRINE  ASSESSMENT:   1) hypothyroidism PLAN:   -check tsh  NEUROLOGIC  ASSESSMENT:   1) VP shunt after hemorrhagic CVA 1996; residual L sided weakness PLAN:   -PT consult  CLINICAL SUMMARY: 56 y/o male with a VP shunt inserted in 1996 after an ICH was admitted for SIRS and a high fever without a clear source of infection no 12/04.  PCCM asked to assist with work up/LP.  Max Fickle  Pulmonary and Critical Care Medicine [facility] Pager: [phone]  04/16/2012, 12:53 AM

## 2012-04-16 NOTE — Progress Notes (Signed)
Mental status much improved once defervesced. Lactate has cleared. Neurosurgical input noted. Doubt CSF exam needed - He is not willing to have me attempt another LP. If this is felt necessary & if pt agreeable, pl call us back PCCM to sign off  ALVA,RAKESH V.  [phone]

## 2012-04-16 NOTE — Progress Notes (Signed)
Assisted MD McQuaid in lumber puncture. Pt stable and cooperative. MD was unable to obtain fluid. Will continue to monitor.  [place], [place], Charity fundraiser

## 2012-04-17 DIAGNOSIS — D649 Anemia, unspecified: Secondary | ICD-10-CM | POA: Diagnosis present

## 2012-04-17 LAB — BASIC METABOLIC PANEL
BUN: 11 mg/dL (ref 6–23)
CO2: 24 mEq/L (ref 19–32)
Chloride: 111 mEq/L (ref 96–112)
Creatinine, Ser: 0.64 mg/dL (ref 0.50–1.35)
Glucose, Bld: 84 mg/dL (ref 70–99)
Potassium: 3.5 mEq/L (ref 3.5–5.1)

## 2012-04-17 LAB — CBC WITH DIFFERENTIAL/PLATELET
Basophils Absolute: 0 10*3/uL (ref 0.0–0.1)
Basophils Relative: 0 % (ref 0–1)
Eosinophils Relative: 2 % (ref 0–5)
HCT: 24.5 % — ABNORMAL LOW (ref 39.0–52.0)
Hemoglobin: 8.4 g/dL — ABNORMAL LOW (ref 13.0–17.0)
MCH: 29.9 pg (ref 26.0–34.0)
MCHC: 34.3 g/dL (ref 30.0–36.0)
MCV: 87.2 fL (ref 78.0–100.0)
Monocytes Absolute: 0.5 10*3/uL (ref 0.1–1.0)
Monocytes Relative: 5 % (ref 3–12)
Neutro Abs: 8.9 10*3/uL — ABNORMAL HIGH (ref 1.7–7.7)
RDW: 13 % (ref 11.5–15.5)

## 2012-04-17 LAB — RETICULOCYTES
Retic Count, Absolute: 45 10*3/uL (ref 19.0–186.0)
Retic Ct Pct: 1.6 % (ref 0.4–3.1)

## 2012-04-17 LAB — FERRITIN: Ferritin: 132 ng/mL (ref 22–322)

## 2012-04-17 LAB — IRON AND TIBC
Iron: 19 ug/dL — ABNORMAL LOW (ref 42–135)
UIBC: 171 ug/dL (ref 125–400)

## 2012-04-17 MED ORDER — DEXTROMETHORPHAN POLISTIREX 30 MG/5ML PO LQCR: 30.0000 mg | Freq: Two times a day (BID) | ORAL | Status: DC

## 2012-04-17 MED ORDER — DEXTROMETHORPHAN POLISTIREX 30 MG/5ML PO LQCR
5.0000 mL | Freq: Two times a day (BID) | ORAL | Status: DC
  Administered 2012-04-17: 30 mg
  Filled 2012-04-17 (×2): qty 5

## 2012-04-17 MED ORDER — CLINDAMYCIN HCL 150 MG PO CAPS: 450.0000 mg | ORAL_CAPSULE | Freq: Three times a day (TID) | ORAL | Status: DC

## 2012-04-17 NOTE — Progress Notes (Deleted)
Internal Medicine Teaching Program Hospital Discharge Note  Name: [name] MRN: [id number] DOB: 01/11/1956 55 y.o.  Date of Admission: 04/15/2012  5:37 PM Date of Discharge: 04/17/2012 Attending Physician: Ankit Garg, MD  Discharge Diagnosis: 1. *Sepsis secondary to Aspiration pneumonia 2. Normocytic anemia  Discharge Medications:    Medication List        As of 04/17/2012  1:58 PM      STOP taking these medications              guaifenesin 100 MG/5ML syrup     Commonly known as: ROBITUSSIN         TAKE these medications              acetaminophen 325 MG tablet     Commonly known as: TYLENOL     Give 650 mg by tube every 4 (four) hours as needed. For pain          clindamycin 150 MG capsule     Commonly known as: CLEOCIN     Place 3 capsules (450 mg total) into feeding tube 3 (three) times daily.          dextromethorphan 30 MG/5ML liquid     Commonly known as: DELSYM     Place 5 mLs (30 mg total) into feeding tube 2 (two) times daily.          levothyroxine 75 MCG tablet     Commonly known as: SYNTHROID, LEVOTHROID     75 mcg by PEG Tube route daily.          multivitamin Liqd     Give 15 mLs by tube daily.             Disposition and follow-up:   [name] was discharged from [facility] in stable condition.  At the hospital follow up visit please address   1. Follow up blood and urine cultures 2. Speech therapy work with patient on swallowing if he will be eating in the future. Currently has dysphagia and should be NPO only tube feeds.    3. Repeat CBC in 1 week 4. PT/OT 5. Please call patient with an appointment in 1 week   6. Repeat imaging to make sure infection resolves in the future   Follow-up Appointments:  Discharge Orders      Future Orders  Please Complete By  Expires     Discharge instructions          Comments:     Please do not eat by mouth   Please pick up your prescriptions from your pharmacy.  You  will need to take Clindamycin per tube three times a day.  Open the capsule and place the contents in the tube  Your [facility] physician will call you with an appointment within 1 week.  There number is [phone].     Call MD for:  temperature >100.4             Consultations: PT/OT; Neurosurgery (Dr. Cram), Critical Care (Dr. McQuaid)    Procedures Performed:  Ct Head Wo Contrast  04/15/2012  *RADIOLOGY REPORT*  Clinical Data: Decreased level of consciousness.  Fever.  CT HEAD WITHOUT CONTRAST  Technique:  Contiguous axial images were obtained from the base of the skull through the vertex without contrast.  Comparison: 02/24/2011  Findings: There is no evidence of intracranial hemorrhage, brain edema or other signs of acute infarction.  There is no evidence of intracranial   mass lesion or mass effect.  No abnormal extra-axial fluid collections are identified.  Ventricles are stable in size.  A right frontal ventricular shunt catheter is seen with tip remaining in the posterior third ventricle.  Associated right frontal lobe encephalomalacia is unchanged.  Multiple old lacunar infarcts involving the right basal ganglia and internal capsule are also unchanged.   No new intracranial findings are identified.  IMPRESSION:  1.  No acute intracranial findings. 2.  Stable ventricular size with ventricular shunt catheter in stable position. 3.  Stable right frontal encephalomalacia and old right basal ganglia lacunes.   Original Report Authenticated By: John Stahl, M.D.    Ct Abdomen Pelvis W Contrast  04/16/2012  *RADIOLOGY REPORT*  Clinical Data: Fever.  Question VP shunt infection.  CT ABDOMEN AND PELVIS WITH CONTRAST  Technique:  Multidetector CT imaging of the abdomen and pelvis was performed following the standard protocol during bolus administration of intravenous contrast.  Contrast: 80mL OMNIPAQUE IOHEXOL 300 MG/ML  SOLN  Comparison: 02/24/2011  Findings: Lung bases:  Patchy right upper lobe and lower  lobe airspace disease, suspicious for infection.  Left base dependent atelectasis.  Normal heart size.  Trace bilateral pleural effusions.  Abdomen/pelvis:  Trace perihepatic ascites.  Minimal motion degradation throughout.  Normal liver, spleen. A small hiatal hernia.  Gastrostomy tube within the stomach.  The proximal stomach appears thick-walled, favored to be due to underdistension.  Normal pancreas, biliary tract, adrenal glands.  Possible tiny gallstone on image 28/series 2.  Interpolar too small to characterize right renal lesion.  Mild nonspecific symmetric perirenal edema.  No retroperitoneal or retrocrural adenopathy. Normal colon and terminal ileum.  Appendix is not visualized but there is no evidence of right lower quadrant inflammation.  Normal small bowel.  The right-sided VP shunt catheter enters the right side of the abdomen well below the level of the perihepatic ascites.  There is no evidence of edema or fluid along the catheter tract.  The catheter terminates in the right hemi pelvis on image 58, without surrounding fluid collection.  No pelvic adenopathy.    Normal urinary bladder and prostate.  No significant free fluid.  Bones/Musculoskeletal:  No acute osseous abnormality.  Transitional S1 vertebral body.  L2 Schmorl's node defect versus minimal superior endplate compression deformity.  Trace lumbosacral malalignment.  IMPRESSION:  1.  No evidence of shunt infection. 2.  Right base air space disease, suspicious for infection or aspiration. 3.  Trace bilateral pleural effusions and minimal perihepatic ascites. 4.  Possible cholelithiasis.   Original Report Authenticated By: Kyle Talbot, M.D.    Dg Chest Port 1 View  (if Code Sepsis Called)  04/15/2012  *RADIOLOGY REPORT*  Clinical Data: Fever  PORTABLE CHEST - 1 VIEW  Comparison: 06/14/2011  Findings: Cardiomediastinal silhouette is stable.  No acute infiltrate or pleural effusion.  No pulmonary edema.  Right VP shunt catheter is unchanged  in position.  IMPRESSION: No active disease.  No significant change.   Original Report Authenticated By: Liviu Pop, M.D.      Admission HPI:   [name] is a 55 yo man with PMH significant for hemorrhagic stroke in 1996 secondary to cocaine use, with several Neurological deficits, status post VP shunt placement, status post PEG tube placement, and recent UTI on 11/18, participant at the [facility] program who comes in to the [facility] ED with temperature of 105.4F (rectal). He has difficult to understand speech but based on [facility] notes, he temperature of 100.2F yesterday (he attends   [facility] on Tuesdays and Thursdays). He complains of a cough productive of "balls" of sputum for almost 2 weeks now, he does not know the color of the sputum. He had post-tussive emesis once last week but denies N/V, abdominal pain, and fever and chills prior to today. He was taking NyQuil and DayQuil.  He reports having a fall last Friday, he fell on his back while trying to go up some steps from his garden. He denies head injury, or severe injuries anywhere in his body. He walks at home with a cane. Presently he denies pain anywhere in his body. He also denies headache, photophobia, confusion, visual changes, dizziness, lightheadedness, chest pain, shortness of breath, abdominal pain, diarrhea, constipation, dysuria, bowel or bladder incontinence, back pain, or gait changes.   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 rate 24, height 5' 9" (1.753 m), weight 119 lb (53.978 kg), SpO2 100.00%.   BP 89/54  Pulse 95  Temp 101 F (38.3 C) (Rectal)  Resp 24  Ht 5' 9" (1.753 m)  Wt 119 lb (53.978 kg)  BMI 17.57 kg/m2  SpO2 100%   General Appearance:   Alert, cooperative, no distress, appears older than stated age, very thin, diaphoretic    Head:   Normocephalic, without  obvious abnormality, atraumatic, predominant temporal vessel on the right    Eyes:   Right pupil normal size and reactive to light, left pupil is constricted to 3mm, conjunctiva/corneas clear, EOM: unable to gaze downwards, Left eye deviates to the left         Nose:   Nares normal, septum midline, no drainage or sinus tenderness    Throat:   Lips, mucosa, and tongue normal but dry MM; poor dentition.   Neck:   Stiffness that is chronic, no neck pain, symmetrical, trachea midline, no adenopathy;   Back:   Symmetric, no curvature, ROM limited secondary to muscle weakness, no CVA tenderness. No bruises, abrasions, trauma, or sacral ulcer    Lungs:   Clear to auscultation bilaterally, respirations unlabored    Chest wall:   No tenderness or deformity    Heart:   Regular rate and rhythm, S1 and S2 normal, no murmur, rub or gallop    Abdomen:   Soft, non-tender, bowel sounds active all four quadrants,    no masses, no organomegaly. PEG tube in place with no surrounding edema or erythema              Extremities:   Extremities atraumatic, no cyanosis or edema, right leg contracture with limited ROM (unable to fully extend leg), decreased muscle bulk, increased muscle tone   Pulses:   2+ and symmetric all extremities    Skin:   Skin color, texture, turgor normal, no rashes or lesions         Neurologic:   Alert and Oriented x3. CN V, VI, VII, VIII, and XI intact. Strength in UE 5/5 on left, 4/5 on right, in LE 5/5 bilaterally sensation intact throughout. Unable to fully assess for Kernig's sign due to right leg contracture but appears negative. Unable to assess for Brudzinski sign secondary to chronic neck stiffness and right leg contracture.      Hospital Course by problem list: 55 year old male presenting with fever of unknown origin (105.4F), tachycardia, tachypnea, and hypotension (  89/54) to the ED 04/15/12. Baseline blood pressure is 90s/60s. He had and anion gap on admission of 14 and lactic  acidosis 3.46.   1. *Sepsis secondary to Aspiration pneumonia Etiology is dysphagia due to old stroke.  Initially this patient had fever of unknown origin. C. Difficile, HIV and flu were negative. Urine was not suspcious for UTI. Blood and urine cultures are still pending. Initially due to concern for VP shunt infection a lumbar puncture was attempted by Critical Care Medicine but unsuccessful x 1. Neurosurgery was consulted (Dr. Cram) who thought VP shunt infection was unlikely as the patient has had the shunt for two years and is without signs of meningismus. We did not repeat an LP and Neurosurgery signed off. We performed at CT head which showed stable changes right frontal encephalomalacia and old right basal ganglia lacunes. CT abdomen/pelvis showed right upper and lower lobe airspace disease suspicious for infection or aspiration. Left base atelectasis. Trace bilateral pleural effusions, trace perihepatic ascites, possible cholelithiasis. CXR was normal. He had SIRS criteria this admission with fever, leukocytosis, tachycardia, tachypnea and sepsis now with know source of infection. He also had an elevated lactic acid 3.46 which trended down to 1.3. He has had dysphasia for two years at least but still intermittently eating oral food (i.e ate chocolate cake over Thanksgiving) though he should be nothing by mouth (NPO) and eating per tube feeds only.  We put him on normal saline 150 cc/hr which was decreased to 125 cc/hr then off by the day of discharge.  We started Clindamycin 450 mg on 04/16/12.  He should receive this tid x 7-10 days to treat for aspiration pneumonia.  We will treat for 7 more days. [facility] will need to follow up with blood cultures and urine cultures as they are still pending.      2. Normocytic anemia We trended his CBC this admission.  We recommend repeat CBC outpatient. His baseline hemoglobin is 9-11.  His hemoglobin, hematocrit on day of discharge was 8.4/24.5 which could be  dilutional secondary IVF.  Colonoscopy 2012 showed Internal and external hemorrhoids.  Please follow up.    Discharge Vitals:  BP 110/54  Pulse 72  Temp 98.3 F (36.8 C) (Oral)  Resp 21  Ht 5' 9" (1.753 m)  Wt 124 lb 1.9 oz (56.3 kg)  BMI 18.33 kg/m2  SpO2 98%  Discharge physical exam:  General: lying in bed arousable, nad, alert and oriented x 3, plesant   HEENT:right forehead scar   CV: RRR no rubs, murmurs or gallops   Lung: ctab   Abdomen: soft, tube intact without drainage, ntnd, +normal bowel sounds   Extremities: thin, warm, no cyanosis or edema   Neuro: moving all 4 extremities   Skin: some excoriations to back otherwise intact., abrasion to left forearm from tape   Results for [name], [name] (MRN [id number]) as of 04/17/2012 13:17   Ref. Range  04/15/2012 18:28  04/15/2012 18:31  04/15/2012 18:35  04/16/2012 04:30  04/16/2012 04:50  04/17/2012 05:05   Sodium  Latest Range: 135-145 mEq/L    142  142    141  141   Potassium  Latest Range: 3.5-5.1 mEq/L    3.7  3.9    4.5  3.5   Chloride  Latest Range: 96-112 mEq/L    108  105    111  111   CO2  Latest Range: 19-32 mEq/L      23    23  24     BUN  Latest Range: 6-23 mg/dL    23  21    16  11   Creatinine  Latest Range: 0.50-1.35 mg/dL    1.00  0.88    0.81  0.64   Calcium  Latest Range: 8.4-10.5 mg/dL      10.4    8.3 (L)  8.6   GFR calc non Af Amer  Latest Range: >90 mL/min      >90    >90  >90   GFR calc Af Amer  Latest Range: >90 mL/min      >90    >90  >90   Glucose  Latest Range: 70-99 mg/dL    93  90    89  84   Calcium Ionized  Latest Range: 1.12-1.23 mmol/L    1.27 (H)           Alkaline Phosphatase  Latest Range: 39-117 U/L      68         Albumin  Latest Range: 3.5-5.2 g/dL      3.9         AST  Latest Range: 0-37 U/L      66 (H)         ALT  Latest Range: 0-53 U/L      56 (H)         Total Protein  Latest Range: 6.0-8.3 g/dL      7.4         Total Bilirubin  Latest Range: 0.3-1.2 mg/dL      0.6         Lactic Acid,  Venous  Latest Range: 0.5-2.2 mmol/L  3.46 (H)      1.3       Procalcitonin  No range found      1.03          Results for [name], [name] (MRN [id number]) as of 04/17/2012 13:17   Ref. Range  04/15/2012 18:18  04/15/2012 18:31  04/16/2012 04:50  04/16/2012 16:25  04/17/2012 08:42   WBC  Latest Range: 4.0-10.5 K/uL  8.6    11.4 (H)  13.3 (H)  10.8 (H)   RBC  Latest Range: 4.22-5.81 MIL/uL  4.19 (L)    3.27 (L)  3.01 (L)  2.81 (L)   Hemoglobin  Latest Range: 13.0-17.0 g/dL  12.4 (L)  12.9 (L)  9.9 (L)  8.9 (L)  8.4 (L)   HCT  Latest Range: 39.0-52.0 %  37.4 (L)  38.0 (L)  29.2 (L)  26.5 (L)  24.5 (L)   MCV  Latest Range: 78.0-100.0 fL  89.3    89.3  88.0  87.2   MCH  Latest Range: 26.0-34.0 pg  29.6    30.3  29.6  29.9   MCHC  Latest Range: 30.0-36.0 g/dL  33.2    33.9  33.6  34.3   RDW  Latest Range: 11.5-15.5 %  13.2    13.4  13.2  13.0   Platelets  Latest Range: 150-400 K/uL  232    196  179  181   Neutrophils Relative  Latest Range: 43-77 %  85 (H)      84 (H)  83 (H)   Lymphocytes Relative  Latest Range: 12-46 %  8 (L)      9 (L)  11 (L)   Monocytes Relative  Latest Range: 3-12 %  6      5  5   Eosinophils Relative  Latest Range: 0-5 %    0      1  2   Basophils Relative  Latest Range: 0-1 %  0      0  0   NEUT#  Latest Range: 1.7-7.7 K/uL  7.3      11.3 (H)  8.9 (H)   Lymphocytes Absolute  Latest Range: 0.7-4.0 K/uL  0.7      1.2  1.1   Monocytes Absolute  Latest Range: 0.1-1.0 K/uL  0.5      0.7  0.5   Eosinophils Absolute  Latest Range: 0.0-0.7 K/uL  0.0      0.2  0.2   Basophils Absolute  Latest Range: 0.0-0.1 K/uL  0.0      0.0  0.0   RBC.  Latest Range: 4.22-5.81 MIL/uL          2.81 (L)   Retic Ct Pct  Latest Range: 0.4-3.1 %          1.6   Retic Count, Manual  Latest Range: 19.0-186.0 K/uL          45.0    Results for [name], [name] (MRN [id number]) as of 04/17/2012 13:17   Ref. Range  04/15/2012 18:31  04/15/2012 18:35  04/16/2012 04:50  04/17/2012 05:05   Glucose  Latest Range: 70-99  mg/dL  93  90  89  84   TSH  Latest Range: 0.350-4.500 uIU/mL      0.924      Results for [name], [name] (MRN [id number]) as of 04/17/2012 13:17   Ref. Range  04/16/2012 04:50  04/16/2012 06:04   Influenza A By PCR  Latest Range: NEGATIVE     NEGATIVE   Influenza B By PCR  Latest Range: NEGATIVE     NEGATIVE   H1N1 flu by pcr  Latest Range: NOT DETECTED     NOT DETECTED   HIV  Latest Range: NON REACTIVE   NON REACTIVE      Results for [name], [name] (MRN [id number]) as of 04/17/2012 13:17   Ref. Range  04/15/2012 18:16   Color, Urine  Latest Range: YELLOW   YELLOW   APPearance  Latest Range: CLEAR   HAZY (A)   Specific Gravity, Urine  Latest Range: 1.005-1.030   1.015   pH  Latest Range: 5.0-8.0   5.5   Glucose  Latest Range: NEGATIVE mg/dL  NEGATIVE   Bilirubin Urine  Latest Range: NEGATIVE   NEGATIVE   Ketones, ur  Latest Range: NEGATIVE mg/dL  NEGATIVE   Protein  Latest Range: NEGATIVE mg/dL  NEGATIVE   Urobilinogen, UA  Latest Range: 0.0-1.0 mg/dL  1.0   Nitrite  Latest Range: NEGATIVE   NEGATIVE   Leukocytes, UA  Latest Range: NEGATIVE   NEGATIVE   Hgb urine dipstick  Latest Range: NEGATIVE   NEGATIVE    Results for [name], [name] (MRN [id number]) as of 04/17/2012 13:17   Ref. Range  04/16/2012 07:31   C difficile by pcr  Latest Range: NEGATIVE   NEGATIVE    Discharge Labs:  Results for orders placed during the hospital encounter of 04/15/12 (from the past 24 hour(s))   CBC WITH DIFFERENTIAL     Status: Abnormal     Collection Time     04/16/12  4:25 PM       Component  Value  Range     WBC  13.3 (*)  4.0 - 10.5 K/uL     RBC  3.01 (*)  4.22 - 5.81 MIL/uL       Hemoglobin  8.9 (*)  13.0 - 17.0 g/dL     HCT  26.5 (*)  39.0 - 52.0 %     MCV  88.0   78.0 - 100.0 fL     MCH  29.6   26.0 - 34.0 pg     MCHC  33.6   30.0 - 36.0 g/dL     RDW  13.2   11.5 - 15.5 %     Platelets  179   150 - 400 K/uL     Neutrophils Relative  84 (*)  43 - 77 %     Neutro Abs  11.3 (*)  1.7 - 7.7 K/uL      Lymphocytes Relative  9 (*)  12 - 46 %     Lymphs Abs  1.2   0.7 - 4.0 K/uL     Monocytes Relative  5   3 - 12 %     Monocytes Absolute  0.7   0.1 - 1.0 K/uL     Eosinophils Relative  1   0 - 5 %     Eosinophils Absolute  0.2   0.0 - 0.7 K/uL     Basophils Relative  0   0 - 1 %     Basophils Absolute  0.0   0.0 - 0.1 K/uL   BASIC METABOLIC PANEL     Status: Normal     Collection Time     04/17/12  5:05 AM       Component  Value  Range     Sodium  141   135 - 145 mEq/L     Potassium  3.5   3.5 - 5.1 mEq/L     Chloride  111   96 - 112 mEq/L     CO2  24   19 - 32 mEq/L     Glucose, Bld  84   70 - 99 mg/dL     BUN  11   6 - 23 mg/dL     Creatinine, Ser  0.64   0.50 - 1.35 mg/dL     Calcium  8.6   8.4 - 10.5 mg/dL     GFR calc non Af Amer  >90   >90 mL/min     GFR calc Af Amer  >90   >90 mL/min   CBC WITH DIFFERENTIAL     Status: Abnormal     Collection Time     04/17/12  8:42 AM       Component  Value  Range     WBC  10.8 (*)  4.0 - 10.5 K/uL     RBC  2.81 (*)  4.22 - 5.81 MIL/uL     Hemoglobin  8.4 (*)  13.0 - 17.0 g/dL     HCT  24.5 (*)  39.0 - 52.0 %     MCV  87.2   78.0 - 100.0 fL     MCH  29.9   26.0 - 34.0 pg     MCHC  34.3   30.0 - 36.0 g/dL     RDW  13.0   11.5 - 15.5 %     Platelets  181   150 - 400 K/uL     Neutrophils Relative  83 (*)  43 - 77 %     Neutro Abs  8.9 (*)  1.7 - 7.7 K/uL     Lymphocytes Relative  11 (*)  12 - 46 %     Lymphs Abs

## 2012-04-17 NOTE — Evaluation (Signed)
Occupational Therapy Evaluation and Discharge Patient Details Name: CALDWELL KRONENBERGER MRN: [id number] DOB: 1956-02-24 Today's Date: 04/17/2012 Time: 4098-1191 OT Time Calculation (min): 31 min  OT Assessment / Plan / Recommendation Clinical Impression  Pt admitted with cough,     OT Assessment  All further OT needs can be met in the next venue of care    Follow Up Recommendations  Home health OT;Supervision/Assistance - 24 hour    Barriers to Discharge      Equipment Recommendations  None recommended by OT    Recommendations for Other Services    Frequency       Precautions / Restrictions Precautions Precautions: Fall   Pertinent Vitals/Pain Pt denies any pain at this time    ADL       OT Diagnosis: Generalized weakness  OT Problem List: Decreased strength;Decreased activity tolerance OT Treatment Interventions:     OT Goals    Visit Information  Last OT Received On: 04/17/12 Assistance Needed: +2 (lines)    Subjective Data  Subjective: Those dogs will eat your throat out if you try to break in Patient Stated Goal: Return home with aunt   Prior Functioning     Home Living Lives With: Other (Comment) (aunt ) Available Help at Discharge: Family Type of Home: House Home Access: Stairs to enter [employer] of Steps: 5 Entrance Stairs-Rails: Right Home Layout: Two level;Able to live on main level with bedroom/bathroom Bathroom Shower/Tub: Walk-in shower;Door [employer] Toilet: Standard Bathroom Accessibility: Yes How Accessible: Accessible via walker Home Adaptive Equipment: Bedside commode/3-in-1;Walker - rolling;Straight cane Prior Function Level of Independence: Needs assistance Needs Assistance: Light Housekeeping;Meal Prep;Gait Meal Prep: Total Light Housekeeping: Total Gait Assistance: Mod (I) with RW Able to Take Stairs?: Yes Driving:  (Uses [facility] for transportation) Vocation: On disability Comments: Hx of stroke 1996 - affected vision  to left side Communication Communication:  (slurred speech with occasional mucus bulid up) Dominant Hand: Right         Vision/Perception     Cognition  Overall Cognitive Status: Appears within functional limits for tasks assessed/performed Arousal/Alertness: Awake/alert Orientation Level: Appears intact for tasks assessed Behavior During Session: [facility] for tasks performed Cognition - Other Comments: at times ?impulsive? vs used to being able to do for himself. Asked pt if he is allowed to get up on his own to which he responded, "Yes, I do at home." Asked pt to call for RN due to lines and no access to RW at this time, to which pt agreed and was able to tell therapist how to call for RN.    Extremity/Trunk Assessment Right Upper Extremity Assessment RUE ROM/Strength/Tone: Within functional levels RUE Sensation: WFL - Light Touch RUE Coordination: WFL - gross/fine motor Left Upper Extremity Assessment LUE ROM/Strength/Tone: Within functional levels LUE Sensation: WFL - Light Touch LUE Coordination: Deficits LUE Coordination Deficits: movements not fluid, but no difficulty with manipulating objects  Right Lower Extremity Assessment RLE ROM/Strength/Tone: Deficits (At least 3/5 gross with functional mobility.) RLE ROM/Strength/Tone Deficits: At least 3/5 gross with functional mobility.  No contractures noted per MD report. RLE Sensation: WFL - Light Touch Left Lower Extremity Assessment LLE ROM/Strength/Tone: Within functional levels LLE Sensation: WFL - Light Touch     Mobility Bed Mobility Bed Mobility: Supine to Sit;Sitting - Scoot to Edge of Bed Supine to Sit: HOB flat;4: Min guard Sitting - Scoot to [employer] of Bed: 4: Min guard Details for Bed Mobility Assistance: minguard for safety with cues for technique Transfers  Sit to Stand: 4: Min assist;From bed     Shoulder Instructions     Exercise     Balance     End of Session OT - End of Session Equipment Utilized  During Treatment: Gait belt Activity Tolerance: Patient tolerated treatment well Patient left: in chair;with call bell/phone within reach Nurse Communication: Mobility status  GO     Faiz Weber 04/17/2012, 2:10 PM

## 2012-04-17 NOTE — Evaluation (Signed)
Physical Therapy Evaluation Patient Details Name: [name] MRN: [id number] DOB: 05-20-1955 Today's Date: 04/17/2012 Time: 7829-5621 PT Time Calculation (min): 31 min  PT Assessment / Plan / Recommendation Clinical Impression  Pt is 56 y/o PMH significant for hemorrhagic stroke in 1996 secondary to cocaine use, with several Neurological deficits, s/p VP shunt placement, s/p PEG tube placement, and recent UTI on 11/18, [facility] who comes in to the [facility] ED with temperature of 105.35F (rectal).  Currently attempting source of temperature and infection.  Pt limited due to fatigue and will benefit from acute PT services to improve overall mobility and prepare for safe d/c home.    PT Assessment  Patient needs continued PT services    Follow Up Recommendations  Home health PT;Supervision/Assistance - 24 hour    Does the patient have the potential to tolerate intense rehabilitation      Barriers to Discharge None      Equipment Recommendations  None recommended by OT    Recommendations for Other Services     Frequency Min 3X/week    Precautions / Restrictions Precautions Precautions: Fall Restrictions Weight Bearing Restrictions: No   Pertinent Vitals/Pain No c/o pain      Mobility  Bed Mobility Bed Mobility: Supine to Sit;Sitting - Scoot to Edge of Bed Supine to Sit: HOB flat;4: Min guard Sitting - Scoot to [place] of Bed: 4: Min guard Details for Bed Mobility Assistance: minguard for safety with cues for technique Transfers Transfers: Sit to Stand;Stand to Sit Sit to Stand: 4: Min assist;From bed Stand to Sit: 4: Min assist Details for Transfer Assistance: (A) to initiate transfer with cues for hand placement Ambulation/Gait Ambulation/Gait Assistance: 1: +2 Total assist Ambulation/Gait: Patient Percentage: 70% Ambulation Distance (Feet): 8 Feet Assistive device: 2 person hand held assist Ambulation/Gait Assistance Details: +2 (A) to maintain  balance with cues for upright posture. Pt with crhonic forward flexed posture with slight lean to right side.  Gait Pattern: Step-to pattern;Decreased stride length;Decreased hip/knee flexion - right;Shuffle;Narrow base of support;Trunk flexed;Lateral trunk lean to right Stairs: No    Shoulder Instructions     Exercises     PT Diagnosis: Difficulty walking;Generalized weakness  PT Problem List: Decreased strength;Decreased activity tolerance;Decreased balance;Decreased mobility;Decreased cognition;Pain PT Treatment Interventions: DME instruction;Gait training;Stair training;Functional mobility training;Therapeutic activities;Therapeutic exercise;Balance training;Neuromuscular re-education;Patient/family education   PT Goals Acute Rehab PT Goals PT Goal Formulation: With patient Time For Goal Achievement: 04/24/12 Potential to Achieve Goals: Good Pt will go Supine/Side to Sit: with modified independence PT Goal: Supine/Side to Sit - Progress: Goal set today Pt will go Sit to Supine/Side: with modified independence PT Goal: Sit to Supine/Side - Progress: Goal set today Pt will go Sit to Stand: with modified independence PT Goal: Sit to Stand - Progress: Goal set today Pt will go Stand to Sit: with modified independence PT Goal: Stand to Sit - Progress: Goal set today Pt will Ambulate: 16 - 50 feet;with modified independence;with rolling walker PT Goal: Ambulate - Progress: Goal set today Pt will Go Up / Down Stairs: 3-5 stairs;with min assist;with least restrictive assistive device PT Goal: Up/Down Stairs - Progress: Goal set today  Visit Information  Last PT Received On: 04/17/12 Assistance Needed: +2 PT/OT Co-Evaluation/Treatment: Yes    Subjective Data  Subjective: "My dogs will bite you in the throat if anyone comes in my house." Patient Stated Goal: To return home   Prior Functioning  Home Living Lives With: Other (Comment) (aunt )  Available Help at Discharge:  Family Type of Home: House Home Access: Stairs to enter [employer] of Steps: 5 Entrance Stairs-Rails: Right Home Layout: Two level;Able to live on main level with bedroom/bathroom Bathroom Shower/Tub: Walk-in shower;Door [employer] Toilet: Standard Bathroom Accessibility: Yes How Accessible: Accessible via walker Home Adaptive Equipment: Bedside commode/3-in-1;Walker - rolling;Straight cane Prior Function Level of Independence: Needs assistance Needs Assistance: Light Housekeeping;Meal Prep;Gait Meal Prep: Total Light Housekeeping: Total Gait Assistance: Mod (I) with RW Able to Take Stairs?: Yes Driving:  (Uses [facility] for transportation) Vocation: On disability Comments: Hx of stroke 1996 - affected vision to left side Communication Communication:  (slurred speech with occasional mucus bulid up) Dominant Hand: Right    Cognition  Overall Cognitive Status: Appears within functional limits for tasks assessed/performed Arousal/Alertness: Awake/alert Orientation Level: Appears intact for tasks assessed Behavior During Session: [facility] for tasks performed Cognition - Other Comments: at times ?impulsive? vs used to being able to do for himself. Asked pt if he is allowed to get up on his own to which he responded, "Yes, I do at home." Asked pt to call for RN due to lines and no access to RW at this time, to which pt agreed and was able to tell therapist how to call for RN.    Extremity/Trunk Assessment Right Upper Extremity Assessment RUE ROM/Strength/Tone: Within functional levels RUE Sensation: WFL - Light Touch RUE Coordination: WFL - gross/fine motor Left Upper Extremity Assessment LUE ROM/Strength/Tone: Within functional levels LUE Sensation: WFL - Light Touch LUE Coordination: Deficits LUE Coordination Deficits: movements not fluid, but no difficulty with manipulating objects  Right Lower Extremity Assessment RLE ROM/Strength/Tone: Deficits (At least 3/5 gross with  functional mobility.) RLE ROM/Strength/Tone Deficits: At least 3/5 gross with functional mobility.  No contractures noted per MD report. RLE Sensation: WFL - Light Touch Left Lower Extremity Assessment LLE ROM/Strength/Tone: Within functional levels LLE Sensation: WFL - Light Touch   Balance Balance Balance Assessed: Yes Static Sitting Balance Static Sitting - Balance Support: Feet supported Static Sitting - Level of Assistance: 5: Stand by assistance  End of Session PT - End of Session Equipment Utilized During Treatment: Gait belt Activity Tolerance: Patient tolerated treatment well Patient left: in chair;with call bell/phone within reach Nurse Communication: Mobility status  GP     Victor Granados 04/17/2012, 3:58 PM Jake Shark, PT DPT [phone]

## 2012-04-17 NOTE — Progress Notes (Signed)
Subjective: Patient reports "Doing alright...getting better."  Objective: Vital signs in last 24 hours: Temp:  [97.3 F (36.3 C)-98.5 F (36.9 C)] 98.4 F (36.9 C) (12/06 0741) Pulse Rate:  [73-83] 73  (12/06 0741) Resp:  [14-22] 18  (12/06 0741) BP: (94-107)/(50-62) 98/61 mmHg (12/06 0741) SpO2:  [96 %-99 %] 96 % (12/06 0741)  Intake/Output from previous day: 12/05 0701 - 12/06 0700 In: 5059.6 [I.V.:4539.6] Out: 3775 [Urine:3775] Intake/Output this shift: Total I/O In: 125 [I.V.:125] Out: -   Alert, conversant, smiling. Reports no discomfort at present. Medicine following for aspiration pneumonia.  Lab Results:  [facility] 04/16/12 1625 04/16/12 0450  WBC 13.3* 11.4*  HGB 8.9* 9.9*  HCT 26.5* 29.2*  PLT 179 196   BMET  Basename 04/17/12 0505 04/16/12 0450  NA 141 141  K 3.5 4.5  CL 111 111  CO2 24 23  GLUCOSE 84 89  BUN 11 16  CREATININE 0.64 0.81  CALCIUM 8.6 8.3*    Studies/Results: Ct Head Wo Contrast  04/15/2012  *RADIOLOGY REPORT*  Clinical Data: Decreased level of consciousness.  Fever.  CT HEAD WITHOUT CONTRAST  Technique:  Contiguous axial images were obtained from the base of the skull through the vertex without contrast.  Comparison: 02/24/2011  Findings: There is no evidence of intracranial hemorrhage, brain edema or other signs of acute infarction.  There is no evidence of intracranial mass lesion or mass effect.  No abnormal extra-axial fluid collections are identified.  Ventricles are stable in size.  A right frontal ventricular shunt catheter is seen with tip remaining in the posterior third ventricle.  Associated right frontal lobe encephalomalacia is unchanged.  Multiple old lacunar infarcts involving the right basal ganglia and internal capsule are also unchanged.   No new intracranial findings are identified.  IMPRESSION:  1.  No acute intracranial findings. 2.  Stable ventricular size with ventricular shunt catheter in stable position. 3.  Stable  right frontal encephalomalacia and old right basal ganglia lacunes.   Original Report Authenticated By: Myles Rosenthal, M.D.    Ct Abdomen Pelvis W Contrast  04/16/2012  *RADIOLOGY REPORT*  Clinical Data: Fever.  Question VP shunt infection.  CT ABDOMEN AND PELVIS WITH CONTRAST  Technique:  Multidetector CT imaging of the abdomen and pelvis was performed following the standard protocol during bolus administration of intravenous contrast.  Contrast: 80mL OMNIPAQUE IOHEXOL 300 MG/ML  SOLN  Comparison: 02/24/2011  Findings: Lung bases:  Patchy right upper lobe and lower lobe airspace disease, suspicious for infection.  Left base dependent atelectasis.  Normal heart size.  Trace bilateral pleural effusions.  Abdomen/pelvis:  Trace perihepatic ascites.  Minimal motion degradation throughout.  Normal liver, spleen.  A small hiatal hernia.  Gastrostomy tube within the stomach.  The proximal stomach appears thick-walled, favored to be due to underdistension.  Normal pancreas, biliary tract, adrenal glands.  Possible tiny gallstone on image 28/series 2.  Interpolar too small to characterize right renal lesion.  Mild nonspecific symmetric perirenal edema.  No retroperitoneal or retrocrural adenopathy. Normal colon and terminal ileum.  Appendix is not visualized but there is no evidence of right lower quadrant inflammation.  Normal small bowel.  The right-sided VP shunt catheter enters the right side of the abdomen well below the level of the perihepatic ascites.  There is no evidence of edema or fluid along the catheter tract.  The catheter terminates in the right hemi pelvis on image 58, without surrounding fluid collection.  No pelvic adenopathy.  Normal urinary bladder and prostate.  No significant free fluid.  Bones/Musculoskeletal:  No acute osseous abnormality.  Transitional S1 vertebral body.  L2 Schmorl's node defect versus minimal superior endplate compression deformity.  Trace lumbosacral malalignment.  IMPRESSION:   1.  No evidence of shunt infection. 2.  Right base air space disease, suspicious for infection or aspiration. 3.  Trace bilateral pleural effusions and minimal perihepatic ascites. 4.  Possible cholelithiasis.   Original Report Authenticated By: Jeronimo Greaves, M.D.    Dg Chest Port 1 View  (if Code Sepsis Called)  04/15/2012  *RADIOLOGY REPORT*  Clinical Data: Fever  PORTABLE CHEST - 1 VIEW  Comparison: 06/14/2011  Findings: Cardiomediastinal silhouette is stable.  No acute infiltrate or pleural effusion.  No pulmonary edema.  Right VP shunt catheter is unchanged in position.  IMPRESSION: No active disease.  No significant change.   Original Report Authenticated By: Natasha Mead, M.D.     Assessment/Plan: Improving  LOS: 2 days  Medicine following for dx pneumonia, NS will be available if needed. No indication of vp shunt involvement at present.   Georgiann Cocker 04/17/2012, 8:28 AM

## 2012-04-17 NOTE — Progress Notes (Signed)
Internal Medicine Teaching Service Attending Note Date: 04/17/2012  Patient name: [name]  Medical record number: [id number]  Date of birth: Sep 25, 1955    This patient has been seen and discussed with the house staff. Please see their note for complete details. I concur with their findings with the following additions/corrections: Patient will be discharged home today on Clindamycin per PEG tube for a total of 7 days for treatment of aspiration PNA. Follow up with [facility].  Lars Mage 04/17/2012, 12:39 PM

## 2012-04-17 NOTE — Progress Notes (Signed)
Pt d/c home per MD order, pt VSS, family/care giver at [facility], d/c instructions and prescriptions given, pt and family verbalized understanding of d/c, all questions answered

## 2012-04-17 NOTE — Progress Notes (Addendum)
Subjective: Denies chest pain, denies sob, denies cough.  No complaints.   Objective: Vital signs in last 24 hours: Filed Vitals:   04/17/12 0400 04/17/12 0425 04/17/12 0741 04/17/12 1100  BP: 94/53 103/57 98/61 110/54  Pulse: 74 80 73 72  Temp: 98.2 F (36.8 C)  98.4 F (36.9 C)   TempSrc: Oral  Oral   Resp: 18 18 18 21   Height:      Weight:      SpO2: 97% 97% 96% 98%   Weight change:   Intake/Output Summary (Last 24 hours) at 04/17/12 1152 Last data filed at 04/17/12 0800  Gross per 24 hour  Intake 3984.58 ml  Output   3775 ml  Net 209.58 ml   General: lying in bed arousable, nad, alert and oriented x 3, plesant HEENT:right forehead scar CV: RRR no rubs, murmurs or gallops Lung: ctab Abdomen: soft, tube intact without drainage, ntnd, +normal bowel sounds Extremities: thin, warm, no cyanosis or edema  Neuro: moving all 4 extremities Skin: some excoriations to back otherwise intact., abrasion to left forearm from tape   Lab Results: Basic Metabolic Panel:  Lab 04/17/12 4098 04/16/12 0450  NA 141 141  K 3.5 4.5  CL 111 111  CO2 24 23  GLUCOSE 84 89  BUN 11 16  CREATININE 0.64 0.81  CALCIUM 8.6 8.3*  MG -- --  PHOS -- --   Liver Function Tests:  Lab 04/15/12 1835  AST 66*  ALT 56*  ALKPHOS 68  BILITOT 0.6  PROT 7.4  ALBUMIN 3.9   CBC:  Lab 04/17/12 0842 04/16/12 1625  WBC 10.8* 13.3*  NEUTROABS 8.9* 11.3*  HGB 8.4* 8.9*  HCT 24.5* 26.5*  MCV 87.2 88.0  PLT 181 179   Thyroid Function Tests:  Lab 04/16/12 0450  TSH 0.924  T4TOTAL --  FREET4 --  T3FREE --  THYROIDAB --     Urinalysis:  Lab 04/15/12 1816  COLORURINE YELLOW  LABSPEC 1.015  PHURINE 5.5  GLUCOSEU NEGATIVE  HGBUR NEGATIVE  BILIRUBINUR NEGATIVE  KETONESUR NEGATIVE  PROTEINUR NEGATIVE  UROBILINOGEN 1.0  NITRITE NEGATIVE  LEUKOCYTESUR NEGATIVE   Misc. Labs:pending blood and urine cultures   Micro Results: Recent Results (from the past 240 hour(s))  CULTURE, BLOOD  (ROUTINE X 2)     Status: Normal (Preliminary result)   Collection Time   04/15/12  6:10 PM      Component Value Range Status Comment   Specimen Description BLOOD ARM LEFT   Final    Special Requests BOTTLES DRAWN AEROBIC AND ANAEROBIC 5CC   Final    Culture  Setup Time 04/16/2012 02:54   Final    Culture     Final    Value:        BLOOD CULTURE RECEIVED NO GROWTH TO DATE CULTURE WILL BE HELD FOR 5 DAYS BEFORE ISSUING A FINAL NEGATIVE REPORT   Report Status PENDING   Incomplete   URINE CULTURE     Status: Normal (Preliminary result)   Collection Time   04/15/12  6:16 PM      Component Value Range Status Comment   Specimen Description URINE, CLEAN CATCH   Final    Special Requests NONE   Final    Culture  Setup Time 04/15/2012 18:37   Final    Colony Count PENDING   Incomplete    Culture Culture reincubated for better growth   Final    Report Status PENDING   Incomplete   CULTURE,  BLOOD (ROUTINE X 2)     Status: Normal (Preliminary result)   Collection Time   04/15/12  7:15 PM      Component Value Range Status Comment   Specimen Description BLOOD HAND LEFT   Final    Special Requests BOTTLES DRAWN AEROBIC AND ANAEROBIC 2.5CC   Final    Culture  Setup Time 04/16/2012 02:53   Final    Culture     Final    Value:        BLOOD CULTURE RECEIVED NO GROWTH TO DATE CULTURE WILL BE HELD FOR 5 DAYS BEFORE ISSUING A FINAL NEGATIVE REPORT   Report Status PENDING   Incomplete   MRSA PCR SCREENING     Status: Normal   Collection Time   04/15/12 11:20 PM      Component Value Range Status Comment   MRSA by PCR NEGATIVE  NEGATIVE Final   CLOSTRIDIUM DIFFICILE BY PCR     Status: Normal   Collection Time   04/16/12  7:31 AM      Component Value Range Status Comment   C difficile by pcr NEGATIVE  NEGATIVE Final    Studies/Results: Ct Head Wo Contrast  04/15/2012  *RADIOLOGY REPORT*  Clinical Data: Decreased level of consciousness.  Fever.  CT HEAD WITHOUT CONTRAST  Technique:  Contiguous axial  images were obtained from the base of the skull through the vertex without contrast.  Comparison: 02/24/2011  Findings: There is no evidence of intracranial hemorrhage, brain edema or other signs of acute infarction.  There is no evidence of intracranial mass lesion or mass effect.  No abnormal extra-axial fluid collections are identified.  Ventricles are stable in size.  A right frontal ventricular shunt catheter is seen with tip remaining in the posterior third ventricle.  Associated right frontal lobe encephalomalacia is unchanged.  Multiple old lacunar infarcts involving the right basal ganglia and internal capsule are also unchanged.   No new intracranial findings are identified.  IMPRESSION:  1.  No acute intracranial findings. 2.  Stable ventricular size with ventricular shunt catheter in stable position. 3.  Stable right frontal encephalomalacia and old right basal ganglia lacunes.   Original Report Authenticated By: Myles Rosenthal, M.D.    Ct Abdomen Pelvis W Contrast  04/16/2012  *RADIOLOGY REPORT*  Clinical Data: Fever.  Question VP shunt infection.  CT ABDOMEN AND PELVIS WITH CONTRAST  Technique:  Multidetector CT imaging of the abdomen and pelvis was performed following the standard protocol during bolus administration of intravenous contrast.  Contrast: 80mL OMNIPAQUE IOHEXOL 300 MG/ML  SOLN  Comparison: 02/24/2011  Findings: Lung bases:  Patchy right upper lobe and lower lobe airspace disease, suspicious for infection.  Left base dependent atelectasis.  Normal heart size.  Trace bilateral pleural effusions.  Abdomen/pelvis:  Trace perihepatic ascites.  Minimal motion degradation throughout.  Normal liver, spleen.  A small hiatal hernia.  Gastrostomy tube within the stomach.  The proximal stomach appears thick-walled, favored to be due to underdistension.  Normal pancreas, biliary tract, adrenal glands.  Possible tiny gallstone on image 28/series 2.  Interpolar too small to characterize right renal  lesion.  Mild nonspecific symmetric perirenal edema.  No retroperitoneal or retrocrural adenopathy. Normal colon and terminal ileum.  Appendix is not visualized but there is no evidence of right lower quadrant inflammation.  Normal small bowel.  The right-sided VP shunt catheter enters the right side of the abdomen well below the level of the perihepatic ascites.  There is no  evidence of edema or fluid along the catheter tract.  The catheter terminates in the right hemi pelvis on image 58, without surrounding fluid collection.  No pelvic adenopathy.    Normal urinary bladder and prostate.  No significant free fluid.  Bones/Musculoskeletal:  No acute osseous abnormality.  Transitional S1 vertebral body.  L2 Schmorl's node defect versus minimal superior endplate compression deformity.  Trace lumbosacral malalignment.  IMPRESSION:  1.  No evidence of shunt infection. 2.  Right base air space disease, suspicious for infection or aspiration. 3.  Trace bilateral pleural effusions and minimal perihepatic ascites. 4.  Possible cholelithiasis.   Original Report Authenticated By: Jeronimo Greaves, M.D.    Dg Chest Port 1 View  (if Code Sepsis Called)  04/15/2012  *RADIOLOGY REPORT*  Clinical Data: Fever  PORTABLE CHEST - 1 VIEW  Comparison: 06/14/2011  Findings: Cardiomediastinal silhouette is stable.  No acute infiltrate or pleural effusion.  No pulmonary edema.  Right VP shunt catheter is unchanged in position.  IMPRESSION: No active disease.  No significant change.   Original Report Authenticated By: Natasha Mead, M.D.    Medications:  Scheduled Meds:    . clindamycin  450 mg Oral Q8H  . dextromethorphan  5 mL Per Tube BID  . enoxaparin (LOVENOX) injection  40 mg Subcutaneous Daily  . feeding supplement (VITAL 1.5 CAL)  237 mL Per Tube QID  . levothyroxine  75 mcg Per Tube QAC breakfast  . multivitamin  15 mL Per Tube Daily   Continuous Infusions:    . [DISCONTINUED] sodium chloride 150 mL/hr at 04/16/12 0551   . [DISCONTINUED] sodium chloride 125 mL/hr at 04/17/12 0800   PRN Meds:.acetaminophen, ondansetron (ZOFRAN) IV, ondansetron Assessment/Plan: 56 year old male presenting with fever of unknown origin, tachycardia, tachypnea, and hypotension to the ED 04/15/12.  Baseline blood pressure is 90s/60s.  He had and anion gap on admission of 14.    1. Sepsis secondary to aspiration pneumonia -Initially patient had fever of unknown origin.  Less likely VP shunt infection.  C. Diff, HIV and flu were negative. UA was not suspcious for UTI.  Blood and urine cultures are still pending.  Afebrile x 24 hours. Mild leukocytosis 10.8 with normal 10.5, trending down  Plan -started Clindamycin 450 mg tid on 12/5 x 7 days to treat for aspiration pneumonia. Spoke with pharmacy capsule contents can be emptied into tube -Follow up blood cultures and urine which was reincubated for better growth   2. Normocytic anemia -repeat CBC outpatient.  8.4/24.5 which could be dilutional 2/2 IVF.  Baseline Hbg 9-11. Colonoscopy 2012 showed Internal and external hemorhoids  3. DVT Px  -Lovenox   4. F/E/N -IVF 125 cc/hr discontinued  -tube feeds only per Nutrition recs    Dispo: home today with f[facility] with [facility]   The patient does have a current PCP (Thane Edu, MD), therefore will be requiring OPC follow-up after discharge.  He will need follow up with [facility].    The patient does have transportation limitations that hinder transportation to clinic appointments.  .Services Needed at time of discharge: Y = Yes, Blank = No PT:   OT:   RN:   Equipment:   Other:     LOS: 2 days   Annett Gula [phone] 04/17/2012, 11:52 AM

## 2012-04-17 NOTE — Progress Notes (Signed)
Agree with diagnosis of pneumonia.  Suspicion of shunt problem extremely low.  Please call with additional concerns or questions.

## 2012-04-17 NOTE — Discharge Summary (Signed)
Internal Medicine Teaching [facility] Discharge Note  Name: [name] MRN: [id number] DOB: 1955/05/28 56 y.o.  Date of Admission: 04/15/2012  5:37 PM Date of Discharge: 04/17/2012 Attending Physician: Lars Mage, MD  Discharge Diagnosis: 1. *Sepsis secondary to Aspiration pneumonia 2. Normocytic anemia  Discharge Medications:    Medication List        As of 04/17/2012  1:58 PM      STOP taking these medications              guaifenesin 100 MG/5ML syrup     Commonly known as: ROBITUSSIN         TAKE these medications              acetaminophen 325 MG tablet     Commonly known as: TYLENOL     Give 650 mg by tube every 4 (four) hours as needed. For pain          clindamycin 150 MG capsule     Commonly known as: CLEOCIN     Place 3 capsules (450 mg total) into feeding tube 3 (three) times daily.          dextromethorphan 30 MG/5ML liquid     Commonly known as: DELSYM     Place 5 mLs (30 mg total) into feeding tube 2 (two) times daily.          levothyroxine 75 MCG tablet     Commonly known as: SYNTHROID, LEVOTHROID     75 mcg by PEG Tube route daily.          multivitamin Liqd     Give 15 mLs by tube daily.             Disposition and follow-up:   [name] was discharged from [facility] in stable condition.  At the hospital follow up visit please address   1. Follow up blood and urine cultures 2. Speech therapy work with patient on swallowing if he will be eating in the future. Currently has dysphagia and should be NPO only tube feeds.    3. Repeat CBC in 1 week 4. PT/OT 5. Please call patient with an appointment in 1 week   6. Repeat imaging to make sure infection resolves in the future   Follow-up Appointments:  Discharge Orders      Future Orders  Please Complete By  Expires     Discharge instructions          Comments:     Please do not eat by mouth   Please pick up your prescriptions from your pharmacy.  You  will need to take Clindamycin per tube three times a day.  Open the capsule and place the contents in the tube  Your [facility] physician will call you with an appointment within 1 week.  There number is [phone].     Call MD for:  temperature >100.4             Consultations: PT/OT; Neurosurgery (Dr. Wynetta Emery), Critical Care (Dr. Kendrick Fries)    Procedures Performed:  Ct Head Wo Contrast  04/15/2012  *RADIOLOGY REPORT*  Clinical Data: Decreased level of consciousness.  Fever.  CT HEAD WITHOUT CONTRAST  Technique:  Contiguous axial images were obtained from the base of the skull through the vertex without contrast.  Comparison: 02/24/2011  Findings: There is no evidence of intracranial hemorrhage, brain edema or other signs of acute infarction.  There is no evidence of intracranial  mass lesion or mass effect.  No abnormal extra-axial fluid collections are identified.  Ventricles are stable in size.  A right frontal ventricular shunt catheter is seen with tip remaining in the posterior third ventricle.  Associated right frontal lobe encephalomalacia is unchanged.  Multiple old lacunar infarcts involving the right basal ganglia and internal capsule are also unchanged.   No new intracranial findings are identified.  IMPRESSION:  1.  No acute intracranial findings. 2.  Stable ventricular size with ventricular shunt catheter in stable position. 3.  Stable right frontal encephalomalacia and old right basal ganglia lacunes.   Original Report Authenticated By: Myles Rosenthal, M.D.    Ct Abdomen Pelvis W Contrast  04/16/2012  *RADIOLOGY REPORT*  Clinical Data: Fever.  Question VP shunt infection.  CT ABDOMEN AND PELVIS WITH CONTRAST  Technique:  Multidetector CT imaging of the abdomen and pelvis was performed following the standard protocol during bolus administration of intravenous contrast.  Contrast: 80mL OMNIPAQUE IOHEXOL 300 MG/ML  SOLN  Comparison: 02/24/2011  Findings: Lung bases:  Patchy right upper lobe and lower  lobe airspace disease, suspicious for infection.  Left base dependent atelectasis.  Normal heart size.  Trace bilateral pleural effusions.  Abdomen/pelvis:  Trace perihepatic ascites.  Minimal motion degradation throughout.  Normal liver, spleen. A small hiatal hernia.  Gastrostomy tube within the stomach.  The proximal stomach appears thick-walled, favored to be due to underdistension.  Normal pancreas, biliary tract, adrenal glands.  Possible tiny gallstone on image 28/series 2.  Interpolar too small to characterize right renal lesion.  Mild nonspecific symmetric perirenal edema.  No retroperitoneal or retrocrural adenopathy. Normal colon and terminal ileum.  Appendix is not visualized but there is no evidence of right lower quadrant inflammation.  Normal small bowel.  The right-sided VP shunt catheter enters the right side of the abdomen well below the level of the perihepatic ascites.  There is no evidence of edema or fluid along the catheter tract.  The catheter terminates in the right hemi pelvis on image 58, without surrounding fluid collection.  No pelvic adenopathy.    Normal urinary bladder and prostate.  No significant free fluid.  Bones/Musculoskeletal:  No acute osseous abnormality.  Transitional S1 vertebral body.  L2 Schmorl's node defect versus minimal superior endplate compression deformity.  Trace lumbosacral malalignment.  IMPRESSION:  1.  No evidence of shunt infection. 2.  Right base air space disease, suspicious for infection or aspiration. 3.  Trace bilateral pleural effusions and minimal perihepatic ascites. 4.  Possible cholelithiasis.   Original Report Authenticated By: Jeronimo Greaves, M.D.    Dg Chest Port 1 View  (if Code Sepsis Called)  04/15/2012  *RADIOLOGY REPORT*  Clinical Data: Fever  PORTABLE CHEST - 1 VIEW  Comparison: 06/14/2011  Findings: Cardiomediastinal silhouette is stable.  No acute infiltrate or pleural effusion.  No pulmonary edema.  Right VP shunt catheter is unchanged  in position.  IMPRESSION: No active disease.  No significant change.   Original Report Authenticated By: Natasha Mead, M.D.      Admission HPI:   [name] is a 56 yo man with PMH significant for hemorrhagic stroke in 1996 secondary to cocaine use, with several Neurological deficits, status post VP shunt placement, status post PEG tube placement, and recent UTI on 11/18, participant at the [facility] program who comes in to the [facility] ED with temperature of 105.66F (rectal). He has difficult to understand speech but based on [facility] notes, he temperature of 100.26F yesterday (he attends  [facility] on Tuesdays and Thursdays). He complains of a cough productive of "balls" of sputum for almost 2 weeks now, he does not know the color of the sputum. He had post-tussive emesis once last week but denies N/V, abdominal pain, and fever and chills prior to today. He was taking NyQuil and DayQuil.  He reports having a fall last Friday, he fell on his back while trying to go up some steps from his garden. He denies head injury, or severe injuries anywhere in his body. He walks at home with a cane. Presently he denies pain anywhere in his body. He also denies headache, photophobia, confusion, visual changes, dizziness, lightheadedness, chest pain, shortness of breath, abdominal pain, diarrhea, constipation, dysuria, bowel or bladder incontinence, back pain, or gait changes.   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 rate 24, height 5\' 9"  (1.753 m), weight 119 lb (53.978 kg), SpO2 100.00%.   BP 89/54  Pulse 95  Temp 101 F (38.3 C) (Rectal)  Resp 24  Ht 5\' 9"  (1.753 m)  Wt 119 lb (53.978 kg)  BMI 17.57 kg/m2  SpO2 100%   General Appearance:   Alert, cooperative, no distress, appears older than stated age, very thin, diaphoretic    Head:   Normocephalic, without  obvious abnormality, atraumatic, predominant temporal vessel on the right    Eyes:   Right pupil normal size and reactive to light, left pupil is constricted to 3mm, conjunctiva/corneas clear, EOM: unable to gaze downwards, Left eye deviates to the left         Nose:   Nares normal, septum midline, no drainage or sinus tenderness    Throat:   Lips, mucosa, and tongue normal but dry MM; poor dentition.   Neck:   Stiffness that is chronic, no neck pain, symmetrical, trachea midline, no adenopathy;   Back:   Symmetric, no curvature, ROM limited secondary to muscle weakness, no CVA tenderness. No bruises, abrasions, trauma, or sacral ulcer    Lungs:   Clear to auscultation bilaterally, respirations unlabored    Chest wall:   No tenderness or deformity    Heart:   Regular rate and rhythm, S1 and S2 normal, no murmur, rub or gallop    Abdomen:   Soft, non-tender, bowel sounds active all four quadrants,    no masses, no organomegaly. PEG tube in place with no surrounding edema or erythema              Extremities:   Extremities atraumatic, no cyanosis or edema, right leg contracture with limited ROM (unable to fully extend leg), decreased muscle bulk, increased muscle tone   Pulses:   2+ and symmetric all extremities    Skin:   Skin color, texture, turgor normal, no rashes or lesions         Neurologic:   Alert and Oriented x3. CN V, VI, VII, VIII, and XI intact. Strength in UE 5/5 on left, 4/5 on right, in LE 5/5 bilaterally sensation intact throughout. Unable to fully assess for Kernig's sign due to right leg contracture but appears negative. Unable to assess for Brudzinski sign secondary to chronic neck stiffness and right leg contracture.      Hospital Course by problem list: 56 year old male presenting with fever of unknown origin (105.54F), tachycardia, tachypnea, and hypotension (  89/54) to the ED 04/15/12. Baseline blood pressure is 90s/60s. He had and anion gap on admission of 14 and lactic  acidosis 3.46.   1. *Sepsis secondary to Aspiration pneumonia Etiology is dysphagia due to old stroke.  Initially this patient had fever of unknown origin. C. Difficile, HIV and flu were negative. Urine was not suspcious for UTI. Blood and urine cultures are still pending. Initially due to concern for VP shunt infection a lumbar puncture was attempted by Critical Care Medicine but unsuccessful x 1. Neurosurgery was consulted (Dr. Wynetta Emery) who thought VP shunt infection was unlikely as the patient has had the shunt for two years and is without signs of meningismus. We did not repeat an LP and Neurosurgery signed off. We performed at CT head which showed stable changes right frontal encephalomalacia and old right basal ganglia lacunes. CT abdomen/pelvis showed right upper and lower lobe airspace disease suspicious for infection or aspiration. Left base atelectasis. Trace bilateral pleural effusions, trace perihepatic ascites, possible cholelithiasis. CXR was normal. He had SIRS criteria this admission with fever, leukocytosis, tachycardia, tachypnea and sepsis now with know source of infection. He also had an elevated lactic acid 3.46 which trended down to 1.3. He has had dysphasia for two years at least but still intermittently eating oral food (i.e ate chocolate cake over Thanksgiving) though he should be nothing by mouth (NPO) and eating per tube feeds only.  We put him on normal saline 150 cc/hr which was decreased to 125 cc/hr then off by the day of discharge.  We started Clindamycin 450 mg on 04/16/12.  He should receive this tid x 7-10 days to treat for aspiration pneumonia.  We will treat for 7 more days. [facility] will need to follow up with blood cultures and urine cultures as they are still pending.      2. Normocytic anemia We trended his CBC this admission.  We recommend repeat CBC outpatient. His baseline hemoglobin is 9-11.  His hemoglobin, hematocrit on day of discharge was 8.4/24.5 which could be  dilutional secondary IVF.  Colonoscopy 2012 showed Internal and external hemorrhoids.  Please follow up.    Discharge Vitals:  BP 110/54  Pulse 72  Temp 98.3 F (36.8 C) (Oral)  Resp 21  Ht 5\' 9"  (1.753 m)  Wt 124 lb 1.9 oz (56.3 kg)  BMI 18.33 kg/m2  SpO2 98%  Discharge physical exam:  General: lying in bed arousable, nad, alert and oriented x 3, plesant   HEENT:right forehead scar   CV: RRR no rubs, murmurs or gallops   Lung: ctab   Abdomen: soft, tube intact without drainage, ntnd, +normal bowel sounds   Extremities: thin, warm, no cyanosis or edema   Neuro: moving all 4 extremities   Skin: some excoriations to back otherwise intact., abrasion to left forearm from tape   Results for Roy, [name] (MRN [id number]) as of 04/17/2012 13:17   Ref. Range  04/15/2012 18:28  04/15/2012 18:31  04/15/2012 18:35  04/16/2012 04:30  04/16/2012 04:50  04/17/2012 05:05   Sodium  Latest Range: 135-145 mEq/L    142  142    141  141   Potassium  Latest Range: 3.5-5.1 mEq/L    3.7  3.9    4.5  3.5   Chloride  Latest Range: 96-112 mEq/L    108  105    111  111   CO2  Latest Range: 19-32 mEq/L      23    23  24  BUN  Latest Range: 6-23 mg/dL    23  21    16  11    Creatinine  Latest Range: 0.50-1.35 mg/dL    1.61  0.96    0.45  0.64   Calcium  Latest Range: 8.4-10.5 mg/dL      40.9    8.3 (L)  8.6   GFR calc non Af Amer  Latest Range: >90 mL/min      >90    >90  >90   GFR calc Af Amer  Latest Range: >90 mL/min      >90    >90  >90   Glucose  Latest Range: 70-99 mg/dL    93  90    89  84   Calcium Ionized  Latest Range: 1.12-1.23 mmol/L    1.27 (H)           Alkaline Phosphatase  Latest Range: 39-117 U/L      68         Albumin  Latest Range: 3.5-5.2 g/dL      3.9         AST  Latest Range: 0-37 U/L      66 (H)         ALT  Latest Range: 0-53 U/L      56 (H)         Total Protein  Latest Range: 6.0-8.3 g/dL      7.4         Total Bilirubin  Latest Range: 0.3-1.2 mg/dL      0.6         Lactic Acid,  Venous  Latest Range: 0.5-2.2 mmol/L  3.46 (H)      1.3       Procalcitonin  No range found      1.03          Results for Roy, [name] (MRN [id number]) as of 04/17/2012 13:17   Ref. Range  04/15/2012 18:18  04/15/2012 18:31  04/16/2012 04:50  04/16/2012 16:25  04/17/2012 08:42   WBC  Latest Range: 4.0-10.5 K/uL  8.6    11.4 (H)  13.3 (H)  10.8 (H)   RBC  Latest Range: 4.22-5.81 MIL/uL  4.19 (L)    3.27 (L)  3.01 (L)  2.81 (L)   Hemoglobin  Latest Range: 13.0-17.0 g/dL  95.6 (L)  21.3 (L)  9.9 (L)  8.9 (L)  8.4 (L)   HCT  Latest Range: 39.0-52.0 %  37.4 (L)  38.0 (L)  29.2 (L)  26.5 (L)  24.5 (L)   MCV  Latest Range: 78.0-100.0 fL  89.3    89.3  88.0  87.2   MCH  Latest Range: 26.0-34.0 pg  29.6    30.3  29.6  29.9   MCHC  Latest Range: 30.0-36.0 g/dL  08.6    57.8  46.9  62.9   RDW  Latest Range: 11.5-15.5 %  13.2    13.4  13.2  13.0   Platelets  Latest Range: 150-400 K/uL  232    196  179  181   Neutrophils Relative  Latest Range: 43-77 %  85 (H)      84 (H)  83 (H)   Lymphocytes Relative  Latest Range: 12-46 %  8 (L)      9 (L)  11 (L)   Monocytes Relative  Latest Range: 3-12 %  6      5  5    Eosinophils Relative  Latest Range: 0-5 %  0      1  2   Basophils Relative  Latest Range: 0-1 %  0      0  0   NEUT#  Latest Range: 1.7-7.7 K/uL  7.3      11.3 (H)  8.9 (H)   Lymphocytes Absolute  Latest Range: 0.7-4.0 K/uL  0.7      1.2  1.1   Monocytes Absolute  Latest Range: 0.1-1.0 K/uL  0.5      0.7  0.5   Eosinophils Absolute  Latest Range: 0.0-0.7 K/uL  0.0      0.2  0.2   Basophils Absolute  Latest Range: 0.0-0.1 K/uL  0.0      0.0  0.0   RBC.  Latest Range: 4.22-5.81 MIL/uL          2.81 (L)   Retic Ct Pct  Latest Range: 0.4-3.1 %          1.6   Retic Count, Manual  Latest Range: 19.0-186.0 K/uL          45.0    Results for Roy, [name] (MRN [id number]) as of 04/17/2012 13:17   Ref. Range  04/15/2012 18:31  04/15/2012 18:35  04/16/2012 04:50  04/17/2012 05:05   Glucose  Latest Range: 70-99  mg/dL  93  90  89  84   TSH  Latest Range: 0.350-4.500 uIU/mL      0.924      Results for Roy, [name] (MRN [id number]) as of 04/17/2012 13:17   Ref. Range  04/16/2012 04:50  04/16/2012 06:04   Influenza A By PCR  Latest Range: NEGATIVE     NEGATIVE   Influenza B By PCR  Latest Range: NEGATIVE     NEGATIVE   H1N1 flu by pcr  Latest Range: NOT DETECTED     NOT DETECTED   HIV  Latest Range: NON REACTIVE   NON REACTIVE      Results for Roy, [name] (MRN [id number]) as of 04/17/2012 13:17   Ref. Range  04/15/2012 18:16   Color, Urine  Latest Range: YELLOW   YELLOW   APPearance  Latest Range: CLEAR   HAZY (A)   Specific Gravity, Urine  Latest Range: 1.005-1.030   1.015   pH  Latest Range: 5.0-8.0   5.5   Glucose  Latest Range: NEGATIVE mg/dL  NEGATIVE   Bilirubin Urine  Latest Range: NEGATIVE   NEGATIVE   Ketones, ur  Latest Range: NEGATIVE mg/dL  NEGATIVE   Protein  Latest Range: NEGATIVE mg/dL  NEGATIVE   Urobilinogen, UA  Latest Range: 0.0-1.0 mg/dL  1.0   Nitrite  Latest Range: NEGATIVE   NEGATIVE   Leukocytes, UA  Latest Range: NEGATIVE   NEGATIVE   Hgb urine dipstick  Latest Range: NEGATIVE   NEGATIVE    Results for Roy, [name] (MRN [id number]) as of 04/17/2012 13:17   Ref. Range  04/16/2012 07:31   C difficile by pcr  Latest Range: NEGATIVE   NEGATIVE    Discharge Labs:  Results for orders placed during the hospital encounter of 04/15/12 (from the past 24 hour(s))   CBC WITH DIFFERENTIAL     Status: Abnormal     Collection Time     04/16/12  4:25 PM       Component  Value  Range     WBC  13.3 (*)  4.0 - 10.5 K/uL     RBC  3.01 (*)  4.22 - 5.81 MIL/uL  Hemoglobin  8.9 (*)  13.0 - 17.0 g/dL     HCT  16.1 (*)  09.6 - 52.0 %     MCV  88.0   78.0 - 100.0 fL     MCH  29.6   26.0 - 34.0 pg     MCHC  33.6   30.0 - 36.0 g/dL     RDW  04.5   40.9 - 15.5 %     Platelets  179   150 - 400 K/uL     Neutrophils Relative  84 (*)  43 - 77 %     Neutro Abs  11.3 (*)  1.7 - 7.7 K/uL      Lymphocytes Relative  9 (*)  12 - 46 %     Lymphs Abs  1.2   0.7 - 4.0 K/uL     Monocytes Relative  5   3 - 12 %     Monocytes Absolute  0.7   0.1 - 1.0 K/uL     Eosinophils Relative  1   0 - 5 %     Eosinophils Absolute  0.2   0.0 - 0.7 K/uL     Basophils Relative  0   0 - 1 %     Basophils Absolute  0.0   0.0 - 0.1 K/uL   BASIC METABOLIC PANEL     Status: Normal     Collection Time     04/17/12  5:05 AM       Component  Value  Range     Sodium  141   135 - 145 mEq/L     Potassium  3.5   3.5 - 5.1 mEq/L     Chloride  111   96 - 112 mEq/L     CO2  24   19 - 32 mEq/L     Glucose, Bld  84   70 - 99 mg/dL     BUN  11   6 - 23 mg/dL     Creatinine, Ser  8.11   0.50 - 1.35 mg/dL     Calcium  8.6   8.4 - 10.5 mg/dL     GFR calc non Af Amer  >90   >90 mL/min     GFR calc Af Amer  >90   >90 mL/min   CBC WITH DIFFERENTIAL     Status: Abnormal     Collection Time     04/17/12  8:42 AM       Component  Value  Range     WBC  10.8 (*)  4.0 - 10.5 K/uL     RBC  2.81 (*)  4.22 - 5.81 MIL/uL     Hemoglobin  8.4 (*)  13.0 - 17.0 g/dL     HCT  91.4 (*)  78.2 - 52.0 %     MCV  87.2   78.0 - 100.0 fL     MCH  29.9   26.0 - 34.0 pg     MCHC  34.3   30.0 - 36.0 g/dL     RDW  95.6   21.3 - 15.5 %     Platelets  181   150 - 400 K/uL     Neutrophils Relative  83 (*)  43 - 77 %     Neutro Abs  8.9 (*)  1.7 - 7.7 K/uL     Lymphocytes Relative  11 (*)  12 - 46 %     Lymphs Abs  1.1   0.7 - 4.0 K/uL     Monocytes Relative  5   3 - 12 %     Monocytes Absolute  0.5   0.1 - 1.0 K/uL     Eosinophils Relative  2   0 - 5 %     Eosinophils Absolute  0.2   0.0 - 0.7 K/uL     Basophils Relative  0   0 - 1 %     Basophils Absolute  0.0   0.0 - 0.1 K/uL   RETICULOCYTES     Status: Abnormal     Collection Time     04/17/12  8:42 AM       Component  Value  Range     Retic Ct Pct  1.6   0.4 - 3.1 %     RBC.  2.81 (*)  4.22 - 5.81 MIL/uL     Retic Count, Manual  45.0   19.0 - 186.0 K/uL      Signed: Annett Gula 04/17/2012, 1:58 PM   Time Spent on Discharge: <30 minutes   Services Ordered on Discharge: none Equipment Ordered on Discharge: none

## 2012-04-19 LAB — URINE CULTURE: Colony Count: 25000

## 2012-04-22 LAB — CULTURE, BLOOD (ROUTINE X 2): Culture: NO GROWTH

## 2012-09-25 ENCOUNTER — Other Ambulatory Visit (HOSPITAL_COMMUNITY): Payer: Self-pay | Admitting: Family Medicine

## 2012-09-25 ENCOUNTER — Ambulatory Visit (HOSPITAL_COMMUNITY)
Admission: RE | Admit: 2012-09-25 | Discharge: 2012-09-25 | Disposition: A | Discharge status: Home / Self Care | Payer: Medicare (Managed Care) | Source: Ambulatory Visit | Attending: Family Medicine | Admitting: Family Medicine

## 2012-09-25 DIAGNOSIS — K9423 Gastrostomy malfunction: Secondary | ICD-10-CM | POA: Insufficient documentation

## 2012-09-25 DIAGNOSIS — Y833 Surgical operation with formation of external stoma as the cause of abnormal reaction of the patient, or of later complication, without mention of misadventure at the time of the procedure: Secondary | ICD-10-CM | POA: Insufficient documentation

## 2012-09-25 DIAGNOSIS — E46 Unspecified protein-calorie malnutrition: Secondary | ICD-10-CM

## 2012-09-25 DIAGNOSIS — Z431 Encounter for attention to gastrostomy: Secondary | ICD-10-CM | POA: Insufficient documentation

## 2012-09-25 MED ORDER — IOHEXOL 300 MG/ML  SOLN: 50.0000 mL | Freq: Once | INTRAMUSCULAR | Status: DC | PRN

## 2013-02-05 ENCOUNTER — Other Ambulatory Visit: Payer: Self-pay | Admitting: *Deleted

## 2013-02-05 ENCOUNTER — Ambulatory Visit
Admission: RE | Admit: 2013-02-05 | Discharge: 2013-02-05 | Disposition: A | Discharge status: Home / Self Care | Payer: No Typology Code available for payment source | Source: Ambulatory Visit | Attending: *Deleted | Admitting: *Deleted

## 2013-02-05 DIAGNOSIS — R05 Cough: Secondary | ICD-10-CM

## 2013-02-10 ENCOUNTER — Other Ambulatory Visit (HOSPITAL_COMMUNITY): Payer: Self-pay | Admitting: *Deleted

## 2013-02-10 ENCOUNTER — Ambulatory Visit (HOSPITAL_COMMUNITY)
Admission: RE | Admit: 2013-02-10 | Discharge: 2013-02-10 | Disposition: A | Discharge status: Home / Self Care | Payer: Medicare (Managed Care) | Source: Ambulatory Visit | Attending: *Deleted | Admitting: *Deleted

## 2013-02-10 DIAGNOSIS — R1310 Dysphagia, unspecified: Secondary | ICD-10-CM

## 2013-02-10 DIAGNOSIS — K9429 Other complications of gastrostomy: Secondary | ICD-10-CM | POA: Insufficient documentation

## 2013-02-10 MED ORDER — IOHEXOL 300 MG/ML  SOLN
50.0000 mL | Freq: Once | INTRAMUSCULAR | Status: AC | PRN
  Administered 2013-02-10: 10 mL

## 2013-07-20 ENCOUNTER — Observation Stay (HOSPITAL_COMMUNITY): Payer: Medicare (Managed Care)

## 2013-07-20 ENCOUNTER — Inpatient Hospital Stay (HOSPITAL_COMMUNITY)
Admission: AD | Admit: 2013-07-20 | Discharge: 2013-07-22 | DRG: 640 | Disposition: A | Discharge status: Home / Self Care | Payer: Medicare (Managed Care) | Source: Ambulatory Visit | Attending: Internal Medicine | Admitting: Internal Medicine

## 2013-07-20 DIAGNOSIS — I69959 Hemiplegia and hemiparesis following unspecified cerebrovascular disease affecting unspecified side: Secondary | ICD-10-CM

## 2013-07-20 DIAGNOSIS — D509 Iron deficiency anemia, unspecified: Secondary | ICD-10-CM | POA: Diagnosis present

## 2013-07-20 DIAGNOSIS — H547 Unspecified visual loss: Secondary | ICD-10-CM | POA: Diagnosis present

## 2013-07-20 DIAGNOSIS — E41 Nutritional marasmus: Secondary | ICD-10-CM | POA: Diagnosis present

## 2013-07-20 DIAGNOSIS — R531 Weakness: Secondary | ICD-10-CM | POA: Diagnosis present

## 2013-07-20 DIAGNOSIS — Z982 Presence of cerebrospinal fluid drainage device: Secondary | ICD-10-CM

## 2013-07-20 DIAGNOSIS — R5381 Other malaise: Secondary | ICD-10-CM | POA: Diagnosis present

## 2013-07-20 DIAGNOSIS — E869 Volume depletion, unspecified: Secondary | ICD-10-CM | POA: Diagnosis present

## 2013-07-20 DIAGNOSIS — E039 Hypothyroidism, unspecified: Secondary | ICD-10-CM | POA: Diagnosis present

## 2013-07-20 DIAGNOSIS — M109 Gout, unspecified: Secondary | ICD-10-CM | POA: Diagnosis present

## 2013-07-20 DIAGNOSIS — I69920 Aphasia following unspecified cerebrovascular disease: Secondary | ICD-10-CM

## 2013-07-20 DIAGNOSIS — F17200 Nicotine dependence, unspecified, uncomplicated: Secondary | ICD-10-CM | POA: Diagnosis present

## 2013-07-20 DIAGNOSIS — Z681 Body mass index (BMI) 19 or less, adult: Secondary | ICD-10-CM

## 2013-07-20 DIAGNOSIS — R1310 Dysphagia, unspecified: Secondary | ICD-10-CM | POA: Diagnosis present

## 2013-07-20 DIAGNOSIS — I69991 Dysphagia following unspecified cerebrovascular disease: Secondary | ICD-10-CM

## 2013-07-20 DIAGNOSIS — R05 Cough: Secondary | ICD-10-CM | POA: Diagnosis present

## 2013-07-20 DIAGNOSIS — R059 Cough, unspecified: Secondary | ICD-10-CM | POA: Diagnosis present

## 2013-07-20 DIAGNOSIS — H539 Unspecified visual disturbance: Secondary | ICD-10-CM

## 2013-07-20 DIAGNOSIS — N390 Urinary tract infection, site not specified: Secondary | ICD-10-CM | POA: Diagnosis present

## 2013-07-20 DIAGNOSIS — Z931 Gastrostomy status: Secondary | ICD-10-CM

## 2013-07-20 DIAGNOSIS — I69391 Dysphagia following cerebral infarction: Secondary | ICD-10-CM

## 2013-07-20 DIAGNOSIS — E785 Hyperlipidemia, unspecified: Secondary | ICD-10-CM | POA: Diagnosis present

## 2013-07-20 DIAGNOSIS — E43 Unspecified severe protein-calorie malnutrition: Secondary | ICD-10-CM | POA: Diagnosis present

## 2013-07-20 DIAGNOSIS — R058 Other specified cough: Secondary | ICD-10-CM | POA: Diagnosis present

## 2013-07-20 DIAGNOSIS — E871 Hypo-osmolality and hyponatremia: Principal | ICD-10-CM | POA: Diagnosis present

## 2013-07-20 DIAGNOSIS — I69998 Other sequelae following unspecified cerebrovascular disease: Secondary | ICD-10-CM

## 2013-07-20 MED ORDER — LEVOTHYROXINE SODIUM 75 MCG PO TABS
75.0000 ug | ORAL_TABLET | Freq: Every day | ORAL | Status: DC
  Administered 2013-07-21 – 2013-07-22 (×2): 75 ug
  Filled 2013-07-20 (×3): qty 1

## 2013-07-20 MED ORDER — ACETAMINOPHEN 650 MG RE SUPP: 650.0000 mg | Freq: Four times a day (QID) | RECTAL | Status: DC | PRN

## 2013-07-20 MED ORDER — SODIUM CHLORIDE 0.9 % IV SOLN
INTRAVENOUS | Status: DC
  Administered 2013-07-21: 01:00:00 via INTRAVENOUS
  Administered 2013-07-21: 75 mL/h via INTRAVENOUS
  Administered 2013-07-22: 05:00:00 via INTRAVENOUS

## 2013-07-20 MED ORDER — FERROUS SULFATE 300 (60 FE) MG/5ML PO SYRP
300.0000 mg | ORAL_SOLUTION | Freq: Every day | ORAL | Status: DC
  Administered 2013-07-21 – 2013-07-22 (×2): 300 mg
  Filled 2013-07-20 (×3): qty 5

## 2013-07-20 MED ORDER — SODIUM CHLORIDE 0.9 % IJ SOLN: 3.0000 mL | Freq: Two times a day (BID) | INTRAMUSCULAR | Status: DC

## 2013-07-20 MED ORDER — IRON PO TABS: 1.0000 | ORAL_TABLET | Freq: Every day | ORAL | Status: DC

## 2013-07-20 MED ORDER — ACETAMINOPHEN 325 MG PO TABS: 650.0000 mg | ORAL_TABLET | Freq: Four times a day (QID) | ORAL | Status: DC | PRN

## 2013-07-20 MED ORDER — OSMOLITE 1.2 CAL PO LIQD
240.0000 mL | Freq: Every day | ORAL | Status: DC
  Administered 2013-07-21 – 2013-07-22 (×8): 240 mL
  Filled 2013-07-20 (×18): qty 474

## 2013-07-20 MED ORDER — ADULT MULTIVITAMIN LIQUID CH
15.0000 mL | Freq: Every day | ORAL | Status: DC
  Administered 2013-07-21: 15 mL
  Filled 2013-07-20: qty 15

## 2013-07-20 NOTE — Progress Notes (Signed)
[name] [id number] Admission Data: 07/20/2013 7:56 PM Attending Provider: Farley LyJerry Dale Joines, MD  EXB:[id number],MWNUUVPCP:KOEHLER,[name], MD Consults/ Treatment Team:    Roy FredricksonLeonard R Glade is a 58 y.o. male patient admitted from [facility], alert  & orientated  X 2,  Prior, VSS - Blood pressure 98/68, pulse 70, temperature 98.1 F (36.7 C), temperature source Oral, resp. rate 18, height 5\' 9"  (1.753 m), weight 49.397 kg (108 lb 14.4 oz), SpO2 99.00%.,   Allergies:   Allergies  Allergen Reactions  . Ibuprofen     Unknown  . Penicillins     Unknown     Past Medical History  Diagnosis Date  . 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 VP (ventriculoperitoneal) shunt status 1996 and currently  . 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 Hypothyroidism   . Malnutrition     Has PEG tube un place  . Hyperlipidemia   . History of gout 4/13    right foot      SR up x 2, fall risk assessment complete.  No evidence of skin break down noted on exam.    Will cont to monitor and assist as needed.  Kern ReapBrumagin, Marqus Macphee L, RN 07/20/2013 7:56 PM

## 2013-07-20 NOTE — H&P (Signed)
Date: 07/21/2013               Patient Name:  [name] MRN: [id number]  DOB: 1955/11/03 Age / Sex: 58 y.o., male   PCP: [name], MD         Medical Service: Internal Medicine Teaching Service         Attending Physician: Dr. Farley Ly, MD    First Contact: Dr. Aundria Rud Pager: [phone]  Second Contact: Dr. Burtis Junes Pager: [phone]       After Hours (After 5p/  First Contact Pager: [phone]  weekends / holidays): Second Contact Pager: [phone]   Chief Complaint: cough with productive sputum  History of Present Illness: [name] is a pleasant 58 year old African American male from [facility] with PMH of intracranial bleeding s/p VP shunt, dysphagia s/p PEG dependent, hypothyroidism, and malnutrition directly admitted from his [facility] provider earlier today for concerns of possible aspiration pneumonia.  Records sent with patient reveal [name] to be considered high risk for aspiration given that he continues to eat PO (despite being recommended to stay NPO) and gets nutrition via PEG and subsequently has difficulty swallowing.  [name] was seen by Lavinia Sharps, NP earlier today and was noted to be brought in by caregiver for complaint of increased cough over the last 24 hours associated with colored sputum and weakness.  Additionally, her physical exam was notable for wheezing and rhonchi throughout both lung fields and thus, [facility].  Today, [name] reports having a intermittent productive cough with clear to green sputum and having a "cold".  He reports feeling very tired with low energy.  He denies fever, chills, headache, shortness of breath, or chest pain.    Meds: Current Facility-Administered Medications  Medication Dose Route Frequency Provider Last Rate Last Dose  . 0.9 %  sodium chloride infusion   Intravenous Continuous Darden Palmer, MD 75 mL/hr at 07/21/13 0122    . acetaminophen (TYLENOL) tablet 650 mg  650 mg Per Tube  Q6H PRN Darden Palmer, MD       Or  . acetaminophen (TYLENOL) suppository 650 mg  650 mg Rectal Q6H PRN Darden Palmer, MD      . feeding supplement (OSMOLITE 1.2 CAL) liquid 240 mL  240 mL Per Tube 5 X Daily Darden Palmer, MD   240 mL at 07/21/13 0104  . ferrous sulfate 300 (60 FE) MG/5ML syrup 300 mg  300 mg Per Tube Q breakfast Farley Ly, MD      . levothyroxine (SYNTHROID, LEVOTHROID) tablet 75 mcg  75 mcg Per Tube QAC breakfast Darden Palmer, MD      . multivitamin liquid 15 mL  15 mL Per Tube Daily Darden Palmer, MD      . sodium chloride 0.9 % injection 3 mL  3 mL Intravenous Q12H Darden Palmer, MD       Allergies: Allergies as of 07/20/2013 - Review Complete 07/20/2013  Allergen Reaction Noted  . Ibuprofen  04/15/2012  . Penicillins  04/15/2012   Past Medical History  Diagnosis Date  . 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 VP (ventriculoperitoneal) shunt status 1996 and currently  . 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 Hypothyroidism   . Malnutrition     Has PEG tube un place  . Hyperlipidemia   . History of gout 4/13    right foot   Past Surgical History  Procedure Laterality Date  . Ventriculoperitoneal shunt  1996, 2011    Has VP shunt in place as of 04/15/12  . Peg tube placement  09/2010    Has PEG tube in place as of 04/15/12   No family history on file. History   Social History  . Marital Status: Single    Spouse Name: N/A    Number of Children: N/A  . Years of Education: N/A   Occupational History  . Not on file.   Social History Main Topics  . Smoking status: Current Every Day Smoker    Types: Cigarettes  . Smokeless tobacco:  Former NeurosurgeonUser    Quit date: 04/15/2004  . Alcohol Use: No  . 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 Sexual Activity: Not on file   Other Topics Concern  . Not on file   Social History Narrative   He lives with his aunt, Roy JacobsonHelen. [facility] has [facility] in the [facility] since June 14, 2011. Apparently he was fairly functional after his stroke in 1996. He lived with his mother in [place] until she died in 2005. He moved to [place] to live with his aunt in 2005. He had gradually declined since the stroke but was much worse in the last few years. At the time of admission to [facility] he just sat in his chair all day watching TV and falling asleep. His muscles had become atrophic and he developed right sided leg contracture in addition to his left sided weakness. He also had choking and aspiration. Since starting [facility] he has made progress with gait, speech, balance, and oral intake.    Review of Systems:  Constitutional:  Fatigue and generalized weakness.  Denies fever, chills.   HEENT:  Denies congestion  Respiratory:  Productive cough.  Denies SOB.  Cardiovascular:  Denies palpitations and leg swelling.   Gastrointestinal:  Denies nausea, vomiting, abdominal pain   Genitourinary:  Denies dysuria  Musculoskeletal:  Gait problem  Skin:  Denies rash  Neurological:  Weakness.  Denies headaches.    Physical Exam: Blood pressure 91/55, pulse 62, temperature 98.1 F (36.7 C), temperature source Oral, resp. rate 18, height 5\' 9"  (1.753 m), weight 108 lb 14.4 oz (49.397 kg), SpO2 100.00%. Vitals reviewed. General: resting in bed, NAD, mumbles when talking, thin HEENT: L eye deviated, pupils non reactive, left pupil smaller than right, unable to downward gaze Cardiac: RRR Pulm: poor expiratory effort, decreased breath sounds left mid lung Abd: soft, nontender, nondistended, BS present Ext: thin extremities, moving all 4  extremities, warm and well perfused, no pedal edema, +2dp b/l Neuro: alert and oriented X3, cranial nerves II-XII grossly intact, strength and sensation to light touch equal in bilateral upper and lower extremities  Lab results: Basic Metabolic Panel:  Recent Labs  16/02/9602/11/15 0453  NA 124*  K 4.6  CL 91*  CO2 22  GLUCOSE 72  BUN 8  CREATININE 0.56  CALCIUM 8.9   Liver Function Tests:  Recent Labs  07/21/13 0453  AST 42*  ALT 30  ALKPHOS 78  BILITOT 0.4  PROT 5.9*  ALBUMIN 3.1*   CBC:  Recent Labs  07/21/13 0453  WBC 4.7  HGB 10.0*  HCT 27.9*  MCV 82.8  PLT 251   Urine Drug Screen: Drugs of Abuse     Component Value Date/Time   LABOPIA NONE DETECTED 09/11/2010 1853   COCAINSCRNUR NONE DETECTED 09/11/2010 1853   LABBENZ NONE DETECTED 09/11/2010 1853   AMPHETMU NONE DETECTED 09/11/2010 1853   THCU NONE DETECTED 09/11/2010 1853   LABBARB  Value: NONE DETECTED        DRUG SCREEN FOR MEDICAL PURPOSES ONLY.  IF CONFIRMATION IS NEEDED FOR ANY PURPOSE, NOTIFY LAB WITHIN 5 DAYS.        LOWEST DETECTABLE LIMITS FOR URINE DRUG SCREEN Drug Class       Cutoff (ng/mL) Amphetamine      1000 Barbiturate      200 Benzodiazepine   200 Tricyclics       300 Opiates          300 Cocaine          300 THC              50 09/11/2010 1853    Urinalysis: No results found for this basename: COLORURINE, APPERANCEUR, LABSPEC, PHURINE, GLUCOSEU, HGBUR, BILIRUBINUR, KETONESUR, PROTEINUR, UROBILINOGEN, NITRITE, LEUKOCYTESUR,  in the last 72 hours  Imaging results:  Dg Chest Port 1 View  07/21/2013   CLINICAL DATA Cough.  EXAM PORTABLE CHEST - 1 VIEW  COMPARISON February 05, 2013.  FINDINGS Cardiomediastinal silhouette appears normal. Right-sided ventriculoperitoneal shunt is again noted. Stable scarring is seen in left upper lobe. No acute pulmonary disease is noted. No pneumothorax or pleural effusion is noted. Bony thorax is intact.  IMPRESSION No acute cardiopulmonary abnormality seen.  SIGNATURE   Electronically Signed   By: Roque Lias M.D.   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 Raby is a 58 year old male with PMH of intracranial bleed s/p VP shunts, PEG dependent, and hypothyroidism from [facility] directly admitted for concern of possible aspiration PNA. Found to also have generalized weakness.   Productive cough--per pt and per documents sent with pt by NP at office.  Reports clear to green sputum, intermittent. No cough witnessed at time of admission. Hx of aspiration given dysphagia and continuing occasional PO intake despite PEG tube and recommended NPO status. However, CXR on admission clear. No fever.  -admitted to med/surg -NPO -cbc, cmet -aspiration precuations -monitor for now with supportive measures  Generalized weakness--was too weak to stand. Noted to use cane at home. Possibly secondary to deconditioning and malnutrition. BP 90/50-60's. Unclear baseline BP. Weight noted to be 113.4 05/28/13 now down to 108lb's.  -consider nutrition consult -continue osmolite (at home via peg) -IVF with NS for now -PT consult -U/A, UDS -HIV -fall precautions -PT, dispo may be issue, will have primary team contact primary caretaker and/or family -vit b12, iron panel  Hyponatremia--Na 124 on admission.  Was 141 in December 2013.  Likely in setting of decreased po intake and malnutrition.  However, will get urine studies to rule out other possible etiologies. -trend bmet -NS for now, may need to bolus if BP drops further, talk to PCP to find out baseline -Urine Na, osm, serum osm -trend bmet  Hypothyroidism--on  levothyroxine via PEG at home. Last TSH 2013 0.924. Given worsening weakness and fatigue. Will recheck. -f/u TSH -continue home  levothyroxine  Diet: NPO, tube feeds DVT: SCDs Dispo: Disposition is deferred at this time, awaiting improvement of current medical problems. Anticipated discharge in approximately 1 day(s).   The patient does have a current PCP [name], MD) and does not need an [facility] hospital follow-up appointment after discharge.  The patient does not have transportation limitations that hinder transportation to clinic appointments.  Signed: Darden Palmer, MD 07/21/2013, 3:48 AM

## 2013-07-21 ENCOUNTER — Encounter (HOSPITAL_COMMUNITY): Payer: Self-pay | Admitting: *Deleted

## 2013-07-21 DIAGNOSIS — E871 Hypo-osmolality and hyponatremia: Principal | ICD-10-CM

## 2013-07-21 DIAGNOSIS — R5381 Other malaise: Secondary | ICD-10-CM

## 2013-07-21 DIAGNOSIS — R05 Cough: Secondary | ICD-10-CM

## 2013-07-21 DIAGNOSIS — R5383 Other fatigue: Secondary | ICD-10-CM

## 2013-07-21 DIAGNOSIS — R531 Weakness: Secondary | ICD-10-CM | POA: Diagnosis present

## 2013-07-21 DIAGNOSIS — R059 Cough, unspecified: Secondary | ICD-10-CM

## 2013-07-21 LAB — RAPID URINE DRUG SCREEN, HOSP PERFORMED
Amphetamines: NOT DETECTED
BARBITURATES: NOT DETECTED
Benzodiazepines: NOT DETECTED
COCAINE: NOT DETECTED
Opiates: NOT DETECTED
Tetrahydrocannabinol: NOT DETECTED

## 2013-07-21 LAB — BASIC METABOLIC PANEL
BUN: 10 mg/dL (ref 6–23)
CALCIUM: 8.9 mg/dL (ref 8.4–10.5)
CO2: 21 mEq/L (ref 19–32)
CREATININE: 0.55 mg/dL (ref 0.50–1.35)
Chloride: 93 mEq/L — ABNORMAL LOW (ref 96–112)
GFR calc Af Amer: >90 mL/min (ref >90)
GLUCOSE: 75 mg/dL (ref 70–99)
Potassium: 4.7 mEq/L (ref 3.7–5.3)
Sodium: 126 mEq/L — ABNORMAL LOW (ref 137–147)

## 2013-07-21 LAB — COMPREHENSIVE METABOLIC PANEL
ALK PHOS: 78 U/L (ref 39–117)
ALT: 30 U/L (ref 0–53)
AST: 42 U/L — ABNORMAL HIGH (ref 0–37)
Albumin: 3.1 g/dL — ABNORMAL LOW (ref 3.5–5.2)
BILIRUBIN TOTAL: 0.4 mg/dL (ref 0.3–1.2)
BUN: 8 mg/dL (ref 6–23)
CHLORIDE: 91 meq/L — AB (ref 96–112)
CO2: 22 meq/L (ref 19–32)
Calcium: 8.9 mg/dL (ref 8.4–10.5)
Creatinine, Ser: 0.56 mg/dL (ref 0.50–1.35)
GLUCOSE: 72 mg/dL (ref 70–99)
POTASSIUM: 4.6 meq/L (ref 3.7–5.3)
SODIUM: 124 meq/L — AB (ref 137–147)
Total Protein: 5.9 g/dL — ABNORMAL LOW (ref 6.0–8.3)

## 2013-07-21 LAB — CBC
HCT: 27.9 % — ABNORMAL LOW (ref 39.0–52.0)
HEMOGLOBIN: 10 g/dL — AB (ref 13.0–17.0)
MCH: 29.7 pg (ref 26.0–34.0)
MCHC: 35.8 g/dL (ref 30.0–36.0)
MCV: 82.8 fL (ref 78.0–100.0)
Platelets: 251 10*3/uL (ref 150–400)
RBC: 3.37 MIL/uL — AB (ref 4.22–5.81)
RDW: 12.4 % (ref 11.5–15.5)
WBC: 4.7 10*3/uL (ref 4.0–10.5)

## 2013-07-21 LAB — URINALYSIS, ROUTINE W REFLEX MICROSCOPIC
BILIRUBIN URINE: NEGATIVE
Glucose, UA: NEGATIVE mg/dL
Ketones, ur: NEGATIVE mg/dL
Nitrite: POSITIVE — AB
Protein, ur: NEGATIVE mg/dL
Specific Gravity, Urine: 1.013 (ref 1.005–1.030)
UROBILINOGEN UA: 1 mg/dL (ref 0.0–1.0)
pH: 7 (ref 5.0–8.0)

## 2013-07-21 LAB — VITAMIN B12: Vitamin B-12: 1377 pg/mL — ABNORMAL HIGH (ref 211–911)

## 2013-07-21 LAB — OSMOLALITY, URINE: OSMOLALITY UR: 168 mosm/kg — AB (ref 390–1090)

## 2013-07-21 LAB — GLUCOSE, CAPILLARY
GLUCOSE-CAPILLARY: 137 mg/dL — AB (ref 70–99)
Glucose-Capillary: 109 mg/dL — ABNORMAL HIGH (ref 70–99)

## 2013-07-21 LAB — URINE MICROSCOPIC-ADD ON

## 2013-07-21 LAB — IRON AND TIBC
Iron: 24 ug/dL — ABNORMAL LOW (ref 42–135)
Saturation Ratios: 12 % — ABNORMAL LOW (ref 20–55)
TIBC: 205 ug/dL — AB (ref 215–435)
UIBC: 181 ug/dL (ref 125–400)

## 2013-07-21 LAB — SODIUM, URINE, RANDOM: Sodium, Ur: 51 mEq/L

## 2013-07-21 LAB — TSH: TSH: 0.805 u[IU]/mL (ref 0.350–4.500)

## 2013-07-21 LAB — HIV ANTIBODY (ROUTINE TESTING W REFLEX): HIV: NONREACTIVE

## 2013-07-21 LAB — OSMOLALITY: OSMOLALITY: 258 mosm/kg — AB (ref 275–300)

## 2013-07-21 LAB — FERRITIN: FERRITIN: 156 ng/mL (ref 22–322)

## 2013-07-21 MED ORDER — PRO-STAT SUGAR FREE PO LIQD
30.0000 mL | Freq: Every day | ORAL | Status: DC
  Administered 2013-07-21 – 2013-07-22 (×2): 30 mL
  Filled 2013-07-21 (×2): qty 30

## 2013-07-21 MED ORDER — LEVOFLOXACIN 25 MG/ML PO SOLN
750.0000 mg | Freq: Every day | ORAL | Status: DC
  Administered 2013-07-22: 750 mg
  Filled 2013-07-21: qty 30

## 2013-07-21 MED ORDER — LEVOFLOXACIN 500 MG PO TABS
500.0000 mg | ORAL_TABLET | Freq: Every day | ORAL | Status: DC
  Administered 2013-07-21: 500 mg via ORAL
  Filled 2013-07-21: qty 1

## 2013-07-21 MED ORDER — LEVOFLOXACIN 750 MG PO TABS: 750.0000 mg | ORAL_TABLET | Freq: Every day | ORAL | Status: DC

## 2013-07-21 NOTE — H&P (Signed)
Internal Medicine Attending Admission Note Date: 07/21/2013  Patient name: [name] Medical record number: [id number] Date of birth: 1955/11/04 Age: 58 y.o. Gender: male  I saw and evaluated the patient. I reviewed the resident's note and I agree with the resident's findings and plan as documented in the resident's note, with the following additional comments.  Chief Complaint(s): Productive cough  History - key components related to admission: Patient is a 58 year old man, patient of [facility], with history of intercranial bleeding status post VP shunt, dysphagia status post PEG placement, hypothyroidism, and other problems as outlined in the medical history, admitted with productive cough and concern for possible aspiration pneumonia.  Physical Exam - key components related to admission:  Filed Vitals:   07/20/13 1808 07/20/13 2123 07/21/13 0402 07/21/13 0537  BP: 98/68 91/55  90/54  Pulse: 70 62  66  Temp: 98.1 F (36.7 C) 98.1 F (36.7 C)  98.4 F (36.9 C)  TempSrc: Oral Oral  Oral  Resp: 18 18  18   Height: 5\' 9"  (1.753 m)     Weight: 108 lb 14.4 oz (49.397 kg)  98 lb 9.6 oz (44.725 kg)   SpO2: 99% 100%  99%   General: Alert, dysarthric; no acute distress Lungs: Clear Heart: Regular; no extra sounds or murmurs Abdomen: Bowel sounds present, soft, nontender Extremities: No edema   Lab results:   Basic Metabolic Panel:  Recent Labs  40/98/1102/03/27 0453  NA 124*  K 4.6  CL 91*  CO2 22  GLUCOSE 72  BUN 8  CREATININE 0.56  CALCIUM 8.9    Liver Function Tests:  Recent Labs  07/21/13 0453  AST 42*  ALT 30  ALKPHOS 78  BILITOT 0.4  PROT 5.9*  ALBUMIN 3.1*     CBC:  Recent Labs  07/21/13 0453  WBC 4.7  HGB 10.0*  HCT 27.9*  MCV 82.8  PLT 251    CBG:  Recent Labs  07/21/13 0754 07/21/13 1138  GLUCAP 137* 109*    Thyroid Function Tests:  Recent Labs  07/21/13 0619  TSH 0.805    Anemia Panel:  Recent Labs  07/21/13 0619   VITAMINB12 1377*  FERRITIN 156  TIBC 205*  IRON 24*      Urine Drug Screen: Drugs of Abuse     Component Value Date/Time   LABOPIA NONE DETECTED 07/21/2013 0547   COCAINSCRNUR NONE DETECTED 07/21/2013 0547   LABBENZ NONE DETECTED 07/21/2013 0547   AMPHETMU NONE DETECTED 07/21/2013 0547   THCU NONE DETECTED 07/21/2013 0547   LABBARB NONE DETECTED 07/21/2013 0547      Urinalysis    Component Value Date/Time   COLORURINE YELLOW 07/21/2013 0547   APPEARANCEUR CLOUDY* 07/21/2013 0547   LABSPEC 1.013 07/21/2013 0547   PHURINE 7.0 07/21/2013 0547   GLUCOSEU NEGATIVE 07/21/2013 0547   HGBUR TRACE* 07/21/2013 0547   BILIRUBINUR NEGATIVE 07/21/2013 0547   KETONESUR NEGATIVE 07/21/2013 0547   PROTEINUR NEGATIVE 07/21/2013 0547   UROBILINOGEN 1.0 07/21/2013 0547   NITRITE POSITIVE* 07/21/2013 0547   LEUKOCYTESUR LARGE* 07/21/2013 0547    Urine microscopic:  Recent Labs  07/21/13 0547  EPIU RARE  WBCU 21-50  RBCU 0-2  BACTERIA MANY*      Imaging results:  Dg Chest Port 1 View  07/21/2013   CLINICAL DATA Cough.  EXAM PORTABLE CHEST - 1 VIEW  COMPARISON February 05, 2013.  FINDINGS Cardiomediastinal silhouette appears normal. Right-sided ventriculoperitoneal shunt is again noted. Stable scarring is seen in left upper  lobe. No acute pulmonary disease is noted. No pneumothorax or pleural effusion is noted. Bony thorax is intact.  IMPRESSION No acute cardiopulmonary abnormality seen.  SIGNATURE  Electronically Signed   By: Roque Lias M.D.   On: 07/21/2013 00:39    Assessment & Plan by Problem:  1.  Productive cough.  There is no evidence of aspiration pneumonia by initial chest x-ray.  Plan is n.p.o.; continue PEG feedings; repeat chest x-ray after IV volume replacement.  2.  Hyponatremia.  The etiology is not clear; this may be due to volume depletion.  Plan is IV normal saline volume replacement, and follow electrolyte panel for correction.  3.  Urinary tract infection.  Patient  reports urinary frequency, and urinalysis is consistent with UTI.  Plan is empiric antibiotic therapy pending culture results.  4.  Generalized weakness.  Likely multifactorial; plans include IV volume replacement, PT consult, treat urinary tract infection and hyponatremia.  5.  Other problems and plans as per the resident physician's note.

## 2013-07-21 NOTE — Progress Notes (Signed)
Roy Anne,NP called RN regarding code status of pt. Pt remains on full code and they will clarify in the morning. Will continue to monitor.

## 2013-07-21 NOTE — Progress Notes (Signed)
UR completed. Patient changed to inpatient- requiring IVF @ 75cc/hr-NPO 

## 2013-07-21 NOTE — Progress Notes (Signed)
INITIAL NUTRITION ASSESSMENT  DOCUMENTATION CODES Per approved criteria  -Severe malnutrition in the context of chronic illness -Underweight   INTERVENTION: Continue 1 can of Osmolite 1.2 formula five times daily. Add 30 ml Prostat via tube daily. This will provide: 1525 kcal (34 kcal/kg), 81 grams protein (1.8 g/kg), and 780 ml free water. This regimen also meets 100% of RDI's - will discontinue liquid MVI. Once IVF discontinued, recommend free water flushes of 70 ml free water before and after each bolus. This will provide an additional 700 ml free water daily. If pt is to resume diet, recommend SLP evaluation. RD to continue to follow nutrition care plan.  NUTRITION DIAGNOSIS: Inadequate oral intake related to severe dysphagia as evidenced by need for NPO status.   Goal: Intake to meet >90% of estimated nutrition needs.  Monitor:  weight trends, lab trends, I/O's, TF tolerance  Reason for Assessment: Use of Enteral Nutrition  58 y.o. male  Admitting Dx: Productive cough  ASSESSMENT: PMHx significant for intracranial bleeding s/p VP shunt, dysphagia s/p PEG, hypothyroidism and malnutrition. Admitted with possible aspiration PNA.   Per records, pt continues to eat despite recommendations to remain NPO. Patient reports that he eats and drinks things such as half of a peanut butter and jelly sandwich, eggs, bottled water, etc.  Current TF regimen is Osmolite 1.2, 1 can, five times daily. This provides: 1425 kcal (32 kcal/kg), 66 grams protein (1.4 g/kg), and 780 ml free water.  Nutrition Focused Physical Exam:  Subcutaneous Fat:  Orbital Region: n/a Upper Arm Region: severe depletion Thoracic and Lumbar Region: severe depletion  Muscle:  Temple Region: moderate depletion Clavicle Bone Region: severe depletion Clavicle and Acromion Bone Region: severe depletion Scapular Bone Region: severe depletion Dorsal Hand: severe depletion Patellar Region: severe  depletion Anterior Thigh Region: severe depletion Posterior Calf Region: severe depletion  Edema: none  Pt meets criteria for severe MALNUTRITION in the context of chronic illness as evidenced by severe fat and muscle mass loss.  Sodium is low at 124 CBG is 137 Meds of note include: NS at 75 ml/hr, synthroid, 15 ml liquid MVI daily, ferrous sulfate   Height: Ht Readings from Last 1 Encounters:  07/20/13 5\' 9"  (1.753 m)    Weight: Wt Readings from Last 1 Encounters:  07/21/13 98 lb 9.6 oz (44.725 kg)    Ideal Body Weight: 160 lb  % Ideal Body Weight: 61%  Wt Readings from Last 10 Encounters:  07/21/13 98 lb 9.6 oz (44.725 kg)  04/16/12 124 lb 1.9 oz (56.3 kg)    Usual Body Weight: n/a  % Usual Body Weight: n/a  BMI:  Body mass index is 14.55 kg/(m^2). Underweight  Estimated Nutritional Needs: Kcal: 1550 - 1750 Protein: at least 67 grams daily Fluid: 1.5 - 1.8 liters daily  Skin: intact  Diet Order: NPO  EDUCATION NEEDS: -No education needs identified at this time  No intake or output data in the 24 hours ending 07/21/13 1035  Last BM: WDL  Labs:   Recent Labs Lab 07/21/13 0453  NA 124*  K 4.6  CL 91*  CO2 22  BUN 8  CREATININE 0.56  CALCIUM 8.9  GLUCOSE 72    CBG (last 3)   Recent Labs  07/21/13 0754  GLUCAP 137*    Scheduled Meds: . feeding supplement (OSMOLITE 1.2 CAL)  240 mL Per Tube 5 X Daily  . ferrous sulfate  300 mg Per Tube Q breakfast  . levothyroxine  75 mcg Per  Tube QAC breakfast  . multivitamin  15 mL Per Tube Daily  . sodium chloride  3 mL Intravenous Q12H    Continuous Infusions: . sodium chloride 75 mL/hr at 07/21/13 0122    Past Medical History  Diagnosis Date  . 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 VP  (ventriculoperitoneal) shunt status 1996 and currently  . 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 Hypothyroidism   . Malnutrition     Has PEG tube un place  . Hyperlipidemia   . History of gout 4/13    right foot    Past Surgical History  Procedure Laterality Date  . Ventriculoperitoneal shunt  1996, 2011    Has VP shunt in place as of 04/15/12  . Peg tube placement  09/2010    Has PEG tube in place as of 04/15/12    Jarold MottoSamantha Marlee Trentman MS, RD, LDN Inpatient Registered Dietitian Pager: [phone] After-hours pager: [phone]

## 2013-07-21 NOTE — Progress Notes (Addendum)
Subjective: [name] is doing well this morning, still coughing some.  Reports increased urinary frequency, foul smelling urine.    Objective: Vital signs in last 24 hours: Filed Vitals:   07/20/13 1808 07/20/13 2123 07/21/13 0402 07/21/13 0537  BP: 98/68 91/55  90/54  Pulse: 70 62  66  Temp: 98.1 F (36.7 C) 98.1 F (36.7 C)  98.4 F (36.9 C)  TempSrc: Oral Oral  Oral  Resp: 18 18  18   Height: 5\' 9"  (1.753 m)     Weight: 108 lb 14.4 oz (49.397 kg)  98 lb 9.6 oz (44.725 kg)   SpO2: 99% 100%  99%   Weight change:  No intake or output data in the 24 hours ending 07/21/13 0733 PEX General: alert, cooperative, and in no apparent distress HEENT: NCAT, vision grossly intact, oropharynx clear and non-erythematous  Neck: supple, no lymphadenopathy Lungs: clear to ascultation bilaterally, normal work of respiration, no wheezes, rales, ronchi Heart: regular rate and rhythm, no murmurs, gallops, or rubs Abdomen: soft, non-tender, non-distended, normal bowel sounds Extremities: 2+ DP/PT pulses bilaterally, no cyanosis, clubbing, or edema Neurologic: alert & oriented X3, cranial nerves II-XII intact, strength grossly intact, sensation intact to light touch  Lab Results: Basic Metabolic Panel:  Recent Labs Lab 07/21/13 0453  NA 124*  K 4.6  CL 91*  CO2 22  GLUCOSE 72  BUN 8  CREATININE 0.56  CALCIUM 8.9   Liver Function Tests:  Recent Labs Lab 07/21/13 0453  AST 42*  ALT 30  ALKPHOS 78  BILITOT 0.4  PROT 5.9*  ALBUMIN 3.1*   CBC:  Recent Labs Lab 07/21/13 0453  WBC 4.7  HGB 10.0*  HCT 27.9*  MCV 82.8  PLT 251   Urine Drug Screen: Drugs of Abuse     Component Value Date/Time   LABOPIA NONE DETECTED 07/21/2013 0547   COCAINSCRNUR NONE DETECTED 07/21/2013 0547   LABBENZ NONE DETECTED 07/21/2013 0547   AMPHETMU NONE DETECTED 07/21/2013 0547   THCU NONE DETECTED 07/21/2013 0547   LABBARB NONE DETECTED 07/21/2013 0547     Urinalysis:  Recent Labs Lab  07/21/13 0547  COLORURINE YELLOW  LABSPEC 1.013  PHURINE 7.0  GLUCOSEU NEGATIVE  HGBUR TRACE*  BILIRUBINUR NEGATIVE  KETONESUR NEGATIVE  PROTEINUR NEGATIVE  UROBILINOGEN 1.0  NITRITE POSITIVE*  LEUKOCYTESUR LARGE*   Studies/Results: Dg Chest Port 1 View  07/21/2013   CLINICAL DATA Cough.  EXAM PORTABLE CHEST - 1 VIEW  COMPARISON February 05, 2013.  FINDINGS Cardiomediastinal silhouette appears normal. Right-sided ventriculoperitoneal shunt is again noted. Stable scarring is seen in left upper lobe. No acute pulmonary disease is noted. No pneumothorax or pleural effusion is noted. Bony thorax is intact.  IMPRESSION No acute cardiopulmonary abnormality seen.  SIGNATURE  Electronically Signed   By: Roque Lias M.D.   On: 07/21/2013 00:39   Medications: I have reviewed the patient's current medications. Scheduled Meds: . feeding supplement (OSMOLITE 1.2 CAL)  240 mL Per Tube 5 X Daily  . ferrous sulfate  300 mg Per Tube Q breakfast  . levothyroxine  75 mcg Per Tube QAC breakfast  . multivitamin  15 mL Per Tube Daily  . sodium chloride  3 mL Intravenous Q12H   Continuous Infusions: . sodium chloride 75 mL/hr at 07/21/13 0122   PRN Meds:.acetaminophen, acetaminophen Assessment/Plan: #UTI- Patient denies dysuria but reports frequency and foul smelling urine.  UA showed + nitrites, large leucocytes, many bacteria.  UDS negative. -levofloxacin 750 mg x 5 days  #  Cough- [facility] Coughing some during interview today but denies sputum production. History of aspiration given dysphagia and continuing occasional PO intake despite PEG tube and recommended NPO status.  However, CXR on admission clear, patient afebrile, and lungs CTAB on exam today.  -continue NPO  -aspiration precuations -repeat CXR in AM  #Hyponatremia- Na 124 on admission, 141 in 04/2012, 143 in 03/2013 per [facility]. Likely in setting of decreased po intake and malnutrition. However, will get urine  studies to rule out other possible etiologies.  -BMP pending  -continue NS 75 cc/hr for now -Urine Na, osm pending, serum osm 258  #Generalized weakness- Reportedly ambulates with cane at home. Possibly secondary to deconditioning and malnutrition. Baseline BP 90/50-60s per [facility].  Weight 113.4 05/28/13 now down to 108. HIV nonreactive.  [facility] feel comfortable managing patient with his aunt who is primary caregiver, strongly prefer for him to stay with [facility].  ?Iron deficiency anemia, would repeat ferritin in outpatient setting to guide treatment (likely elevated now as acute phase reactant).   -nutrition consult, appreciate recs; continue Osmolite, add Prostat; once IVF discontinued, rec free water flushes of 70 mL free water before and after each bolus  -NS at 75 cc/hr  -PT consult with recommendation for SNF, see above  #Hypothyroidism- Patient on levothyroxine 75mcg via PEG at home. Last TSH 2013 0.924.  Repeat TSH this admission 0.805 (within normal limits).  -continue home levothyroxine   Dispo: Disposition is deferred at this time, awaiting improvement of current medical problems.  Anticipated discharge in approximately 1-2 day(s).   The patient does have a current PCP [name](Robert N Koehler, MD) and [facility].   .Services Needed at time of discharge: Y = Yes, Blank = No PT:   OT:   RN:   Equipment:   Other:     LOS: 1 day   Rocco SereneMorgan Wynn Alldredge, MD 07/21/2013, 7:33 AM

## 2013-07-21 NOTE — Evaluation (Signed)
Physical Therapy Evaluation Patient Details Name: [name] MRN: [id number] DOB: 1956-03-17 Today's Date: 07/21/2013 Time: 1191-4782 PT Time Calculation (min): 17 min  PT Assessment / Plan / Recommendation History of Present Illness  58 y.o. male admitted to [facility] on 07/20/13 with PMH of intracranial bleeding s/p VP shunt, dysphagia s/p PEG dependent, hypothyroidism, and malnutrition directly admitted from his [facility] provider earlier today for concerns of possible aspiration pneumonia.    Clinical Impression  Pt is up to mod assist to mobilize safely with RW around the room.  His aunt, who I spoke with over the phone, reports she is on disability and uses a cane herself.  She reports he falls frequently and I am not sure that she has the ability to safely handle him at this level of assistance.  I would recommend SNF placement for both of their safety at discharge, but she prefers home and is interested in a home aide.  I informed the CM and CSW as the pt is active with the [facility] program and I am not sure what his benefits allow as far as getting a home aide.   PT to follow acutely for deficits listed below.       PT Assessment  Patient needs continued PT services    Follow Up Recommendations  SNF    Does the patient have the potential to tolerate intense rehabilitation     NA  Barriers to Discharge Decreased caregiver support I am not sure how his Aunt who uses a cane herself can handle him physically at his current assistance level without risk of both of them falling and injuring themselves.      Equipment Recommendations  None recommended by PT    Recommendations for Other Services   None  Frequency Min 3X/week    Precautions / Restrictions Precautions Precautions: Fall Precaution Comments: per Aunt, h/o multiple falls   Pertinent Vitals/Pain See vitals flow sheet.       Mobility  Bed Mobility Overal bed mobility: Needs Assistance Bed Mobility: Supine to Sit Supine to  sit: Mod assist;HOB elevated General bed mobility comments: mod assist to support trunk and move bil legs over the side of the bed. Pt needed help initiating movement.  Transfers Overall transfer level: Needs assistance Equipment used: Rolling walker (2 wheeled) Transfers: Sit to/from Stand Sit to Stand: Mod assist General transfer comment: mod assist to support trunk for balance over flexed knees during transitions.  Pt does lean posteriorly throughout transition to stand Ambulation/Gait Ambulation/Gait assistance: Min assist;Mod assist Ambulation Distance (Feet): 20 Feet Assistive device: Rolling walker (2 wheeled) Gait Pattern/deviations: Shuffle;Scissoring;Leaning posteriorly;Drifts right/left;Narrow base of support Gait velocity: decreased Gait velocity interpretation: Below normal speed for age/gender General Gait Details: Pt with right posterior lean, scissoring gait pattern can be up to mod assist to support trunk during gait to prevent falls.  Max verbal cues to stay inside of RW.          PT Diagnosis: Difficulty walking;Abnormality of gait;Generalized weakness;Altered mental status  PT Problem List: Decreased strength;Decreased range of motion;Decreased balance;Decreased activity tolerance;Decreased mobility;Decreased coordination;Decreased cognition;Decreased knowledge of use of DME;Decreased safety awareness;Decreased knowledge of precautions PT Treatment Interventions: DME instruction;Gait training;Stair training;Functional mobility training;Therapeutic activities;Therapeutic exercise;Balance training;Neuromuscular re-education;Cognitive remediation;Patient/family education     PT Goals(Current goals can be found in the care plan section) Acute Rehab PT Goals Patient Stated Goal: Aunt would like for him to come home and in interested in a home healt aide.  PT Goal Formulation: With  family Time For Goal Achievement: 08/04/13 Potential to Achieve Goals: Good  Visit  Information  Last PT Received On: 07/21/13 Assistance Needed: +1 History of Present Illness: 58 y.o. male [facility] with PMH of intracranial bleeding s/p VP shunt, dysphagia s/p PEG dependent, hypothyroidism, and malnutrition directly admitted from his [facility] provider earlier today for concerns of possible aspiration pneumonia.         Prior Functioning  Home Living Family/patient expects to be discharged to:: Private residence Living Arrangements: Other relatives (aunt) Available Help at Discharge: Family;Available 24 hours/day (aunt uses a cane herself and is on disability) Type of Home: House Home Access: Stairs to enter Entergy CorporationEntrance Stairs-Number of Steps: 4 Entrance Stairs-Rails: Right;Left;Can reach both Home Layout: Two level;Able to live on main level with bedroom/bathroom Home Equipment: Dan HumphreysWalker - 2 wheels;Cane - quad;Bedside commode;Tub bench;Grab bars - toilet;Grab bars - tub/shower;Hospital bed Prior Function Level of Independence: Needs assistance Gait / Transfers Assistance Needed: assist with walking per aunt most of the time without RW falls backwards and to the right.  ADL's / Homemaking Assistance Needed: assist with bathing and dressing Communication / Swallowing Assistance Needed: has PEG, but per chart still takes some p.o. even though NPO is recommended.   Communication Communication: Other (comment) (mumbles)    Cognition  Cognition Arousal/Alertness: Awake/alert Behavior During Therapy: Restless;Impulsive Overall Cognitive Status: History of cognitive impairments - at baseline    Extremity/Trunk Assessment Upper Extremity Assessment Upper Extremity Assessment: Generalized weakness (seems to have increased tone throughout all 4 extremities) Lower Extremity Assessment Lower Extremity Assessment: Generalized weakness (Increased tone throughout all 4 extremities. ) Cervical / Trunk Assessment Cervical / Trunk Assessment: Kyphotic;Other  exceptions Cervical / Trunk Exceptions: forward head, rounded shoulders   Balance Balance Overall balance assessment: Needs assistance Sitting-balance support: Feet supported;Bilateral upper extremity supported Sitting balance-Leahy Scale: Poor Sitting balance - Comments: assist needed to prevent posterior LOB while seated EOB.  Postural control: Posterior lean;Right lateral lean Standing balance support: Bilateral upper extremity supported Standing balance-Leahy Scale: Poor Standing balance comment: min to mod assist needed in standing even with RW for support.    End of Session PT - End of Session Equipment Utilized During Treatment: Gait belt Activity Tolerance: Patient tolerated treatment well Patient left: in chair;with call bell/phone within reach;with chair alarm set  GP Functional Assessment Tool Used: assist level Functional Limitation: Mobility: Walking and moving around Mobility: Walking and Moving Around Current Status ([id number](G8978): At least 20 percent but less than 40 percent impaired, limited or restricted Mobility: Walking and Moving Around Goal Status [phone](G8979): At least 1 percent but less than 20 percent impaired, limited or restricted   Ayako Tapanes B. Jaice Lague, PT, DPT [phone]   07/21/2013, 1:52 PM

## 2013-07-22 ENCOUNTER — Inpatient Hospital Stay (HOSPITAL_COMMUNITY): Payer: Medicare (Managed Care)

## 2013-07-22 DIAGNOSIS — E039 Hypothyroidism, unspecified: Secondary | ICD-10-CM

## 2013-07-22 DIAGNOSIS — N390 Urinary tract infection, site not specified: Secondary | ICD-10-CM

## 2013-07-22 LAB — BASIC METABOLIC PANEL
BUN: 12 mg/dL (ref 6–23)
CHLORIDE: 99 meq/L (ref 96–112)
CO2: 25 mEq/L (ref 19–32)
Calcium: 8.9 mg/dL (ref 8.4–10.5)
Creatinine, Ser: 0.65 mg/dL (ref 0.50–1.35)
Glucose, Bld: 80 mg/dL (ref 70–99)
POTASSIUM: 4.5 meq/L (ref 3.7–5.3)
SODIUM: 134 meq/L — AB (ref 137–147)

## 2013-07-22 LAB — GLUCOSE, CAPILLARY: GLUCOSE-CAPILLARY: 86 mg/dL (ref 70–99)

## 2013-07-22 MED ORDER — PRO-STAT SUGAR FREE PO LIQD: 30.0000 mL | Freq: Every day | ORAL | Status: DC

## 2013-07-22 MED ORDER — OSMOLITE 1.2 CAL PO LIQD: 240.0000 mL | Freq: Every day | ORAL | Status: DC

## 2013-07-22 MED ORDER — FREE WATER: Status: DC

## 2013-07-22 MED ORDER — LEVOFLOXACIN 25 MG/ML PO SOLN: 750.0000 mg | Freq: Every day | ORAL | Status: DC

## 2013-07-22 NOTE — Progress Notes (Signed)
BS[name] at 2:45 for the Pt. [name] Saugerorsha confirmed that someone with [facility] will be here at 2:45.  [name] [name] BSW-Intern [name] [phone]

## 2013-07-22 NOTE — Discharge Summary (Signed)
Name: [name] MRN: [id number] DOB: 1956/03/17 58 y.o. 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     feeding supplement (PRO-STAT SUGAR FREE 64) Liqd  Place 30 mLs into feeding tube daily.     free water Soln  Before and after feedings (5 times daily) for total of 700 mL daily.     Iron Tabs  1 tablet by PEG Tube route daily.     levofloxacin 25 MG/ML solution  Commonly known as:  LEVAQUIN  Place 30 mLs (750 mg total) into feeding tube daily.     levothyroxine 75 MCG tablet  Commonly known as:  SYNTHROID, LEVOTHROID  75 mcg by PEG Tube route daily.     multivitamin Liqd  Give 15 mLs by tube daily.        Disposition and follow-up:   RoyAdeyemi R [name] [facility] St[facility].  At the hospital follow up visit please address:  1.  Compliance with antibiotics  2.  Labs / imaging needed at time of follow-up: BMP, ?ferritin  3.  Pending labs/ test needing follow-up: none  Follow-up Appointments: Follow-up Information   Follow up with Roy EduKOEHLER,[name], MD In 1 week.   Specialty:  Family Medicine   Contact information:   [address] [place] [address] [phone]       Discharge Instructions: Discharge Orders   Future Orders Complete By Expires   Call MD for:  persistant dizziness or light-headedness  As directed    Diet - low sodium heart healthy  As directed    Increase activity slowly  As  directed       Consultations:  none  Procedures Performed:  Dg Chest 2 View  07/22/2013   CLINICAL DATA Concern for aspiration pneumonia  EXAM CHEST  2 VIEW  COMPARISON DG CHEST 1V PORT dated 07/20/2013  FINDINGS VP shunt tube identified over the right thorax. Heart size and vascular pattern are normal. No effusion or pneumothorax. Mild left upper lobe scarring is stable. No abnormal opacities on the right.  IMPRESSION No evidence of pneumonia  SIGNATURE  Electronically Signed   By: Esperanza Heiraymond  Rubner M.D.   On: 07/22/2013 08:15   Dg Chest Port 1 View  07/21/2013   CLINICAL DATA Cough.  EXAM PORTABLE CHEST - 1 VIEW  COMPARISON February 05, 2013.  FINDINGS Cardiomediastinal silhouette appears normal. Right-sided ventriculoperitoneal shunt is again noted. Stable scarring is seen in left upper lobe. No acute pulmonary disease is noted. No pneumothorax or pleural effusion is noted. Bony thorax is intact.  IMPRESSION No acute cardiopulmonary abnormality seen.  SIGNATURE  Electronically Signed   By: Roque LiasJames  Green M.D.   On: 07/21/2013 00:39    Admission HPI:  [name] is a pleasant 58 year old African American [facility] from [facility] with PMH of intracranial bleeding s/p VP shunt, dysphagia s/p PEG dependent, hypothyroidism, and malnutrition directly admitted from his [facility] provider  earlier today for concerns of possible aspiration pneumonia. Records sent with patient reveal [name] to be considered high risk for aspiration given that he continues to eat PO (despite being recommended to stay NPO) and gets nutrition via PEG and subsequently has difficulty swallowing. [name] was seen by Lavinia Sharps, NP earlier today and was noted to be brought in by caregiver for complaint of increased cough over the last 24 hours associated with colored sputum and weakness. Additionally, her physical exam was notable for wheezing and rhonchi throughout both lung fields and thus, he was admitted to the hsopital for further evaluation.  Today, Mr. Durflinger reports having a intermittent productive cough with clear to green sputum and having a "cold". He reports feeling very tired with low energy. He denies fever, chills, headache, shortness of breath, or chest pain.    Hospital Course by problem list: 1. UTI, improved-  UA on admission showed + nitrite, large leucocytes, WBC 21-50, many bacteria.  Patient denied dysuria but reported frequency and foul smelling urine, both improved today.  Levofloxacin 750 mg x 5 days initiated while inpatient, discharged on day 2 with rx for additional 3 days.   2. Cough, improved- Per documents sent by NP at [facility].  Patient still coughing some but denies sputum production.  History of aspiration given dysphagia and continuing occasional PO intake despite PEG tube and recommended NPO status.  However, CXR on admission clear, patient afebrile, and lungs CTAB.  Repeat 2 view CXR negative on day of discharge.  Patient advised to continue NPO at discharge.   3. Hyponatremia, improved- Likely due to volume depletion given rapid response to IVFs. Na 124 on admission --> 126 --> 134 day of discharge (141 in 04/2012, 143 in 03/2013 per [facility]).  Patient received NS 75 cc/hr while inpatient.  Would consider repeat BMP in one week.    4. Generalized weakness, improved- Patient reportedly ambulates with cane at home.  Weakness likely secondary to deconditioning and malnutrition.  Baseline BP 90/50-60s per [facility]. Weight 113.4 on 05/28/13 now down to 108.  HIV nonreactive.  Nutrition recommended continuation of Osmolite with addition of Prostat; also recommend 70 mL free water before and after each feeding (5 times daily) for addition 700 mL water daily at discharge.  PT recommended SNF but spoke to [facility], and they feel comfortable managing patient with his aunt who is primary caregiver; strongly prefer for him to stay with [facility], patient and aunt in agreement.  ?Iron deficiency anemia, would repeat ferritin in outpatient  setting to guide treatment (likely elevated this admission as acute phase reactant).    5. Hypothyroidism- Patient on levothyroxine via PEG at home. Last TSH in 2013 0.924. Repeat TSH this admission 0.805 (within normal limits).  Continue current dose at discharge.   Discharge Vitals:   BP 94/60  Pulse 59  Temp(Src) 98 F (36.7 C) (Oral)  Resp 17  Ht 5\' 9"  (1.753 m)  Wt 110 lb 8 oz (50.122 kg)  BMI 16.31 kg/m2  SpO2 100%  Discharge Labs:  Results for orders placed during the hospital encounter of 07/20/13 (from the past 24 hour(s))  OSMOLALITY, URINE     Status: Abnormal   Collection Time    07/21/13  1:00 PM      Result Value Ref Range   Osmolality, Ur 168 (*) 390 - 1090 mOsm/kg  BASIC METABOLIC PANEL     Status: Abnormal   Collection Time    07/21/13  2:22 PM  Result Value Ref Range   Sodium 126 (*) 137 - 147 mEq/L   Potassium 4.7  3.7 - 5.3 mEq/L   Chloride 93 (*) 96 - 112 mEq/L   CO2 21  19 - 32 mEq/L   Glucose, Bld 75  70 - 99 mg/dL   BUN 10  6 - 23 mg/dL   Creatinine, Ser 1.61  0.50 - 1.35 mg/dL   Calcium 8.9  8.4 - 09.6 mg/dL   GFR calc non Af Amer >90  >90 mL/min   GFR calc Af Amer >90  >90 mL/min  BASIC METABOLIC PANEL     Status: Abnormal   Collection Time    07/22/13  4:50 AM      Result Value Ref Range   Sodium 134 (*) 137 - 147 mEq/L   Potassium 4.5  3.7 - 5.3 mEq/L   Chloride 99  96 - 112 mEq/L   CO2 25  19 - 32 mEq/L   Glucose, Bld 80  70 - 99 mg/dL   BUN 12  6 - 23 mg/dL   Creatinine, Ser 0.45  0.50 - 1.35 mg/dL   Calcium 8.9  8.4 - 40.9 mg/dL   GFR calc non Af Amer >90  >90 mL/min   GFR calc Af Amer >90  >90 mL/min  GLUCOSE, CAPILLARY     Status: None   Collection Time    07/22/13  8:11 AM      Result Value Ref Range   Glucose-Capillary 86  70 - 99 mg/dL    Signed: Rocco Serene, MD 07/22/2013, 12:05 PM   Time Spent on Discharge: 40 minutes Services Ordered on Discharge: none Equipment Ordered on Discharge: none

## 2013-07-22 NOTE — Progress Notes (Signed)
Internal Medicine Attending  Date: 07/22/2013  Patient name: Roy FredricksonLeonard R Taborda Medical record number: [id number] Date of birth: August 15, 1955 Age: 58 y.o. Gender: male  I saw and evaluated the patient, and discussed his care on A.M rounds with housestaff.  I reviewed the resident's note by Dr. Aundria Rudogers and I agree with the resident's findings and plans as documented in her note.

## 2013-07-22 NOTE — Discharge Instructions (Signed)
Keep up the good work taking all of your medicines as prescribed.  We have given you a prescription for antibiotics (levofloxacin), and you will need to take this for 3 more days.   Again, you should not eat anything by mouth due to risk of pneumonia.  You are receiving all of your nutrition through your stomach tube.  Please be sure you are getting 70 mL of free water both before and after feedings (5 times daily).   We let [facility] know about everything that happened during your hospitalization, they will schedule a follow-up appointment for you next week.

## 2013-07-22 NOTE — Clinical Documentation Improvement (Signed)
  Possible Clinical Conditions?      Severe Malnutrition       Severe Protein Calorie Malnutrition     Other Condition  Supporting Information:  In the H&P, the admitting MD documented, "Generalized weakness--was too weak to stand. Possibly secondary to deconditioning and malnutrition."   Risk Factors:  BMI on admission was 16.1 kg/(m^2).   Signs & Symptoms:  Per Nutrition consult, "Pt meets criteria for severe MALNUTRITION in the context of chronic illness as evidenced by severe fat and muscle mass loss."  Treatment:  Per the Registered Dietitian's recommendations, "Continue 1 can of Osmolite 1.2 formula five times daily. Add 30 ml Prostat via tube daily. This regimen meets 100% of RDI's - will discontinue liquid MVI.  Once IVF discontinued, recommend free water flushes of 70 ml free water before and after each bolus."   Thank You,  Darla LeschesWendy Tevan Marian, RN, BSN, CCRN Clinical Documentation Improvement Specialist HIM department--[place] Office [phone]

## 2013-07-22 NOTE — Clinical Social Work Psychosocial (Signed)
CSW has reviewed BSW intern's psychosocial and witnessed phone assessment. Patient to DC home with services from [facility]. CSW to request [facility] set up transport for patient home.   [name], Linn[place]CSWA, [place], [phone]

## 2013-07-22 NOTE — Progress Notes (Signed)
Subjective: [name] is doing well this morning, urinary symptoms resolved and cough improved.    Repeat CXR this morning negative.   Objective: Vital signs in last 24 hours: Filed Vitals:   07/21/13 0537 07/21/13 1323 07/21/13 2152 07/22/13 0435  BP: 90/54 91/54 96/60  94/60  Pulse: 66 61 63 59  Temp: 98.4 F (36.9 C) 98.3 F (36.8 C) 98.6 F (37 C) 98 F (36.7 C)  TempSrc: Oral Oral Oral Oral  Resp: 18 18 17 17   Height:      Weight:    110 lb 8 oz (50.122 kg)  SpO2: 99% 100% 99% 100%   Weight change: 1 lb 9.6 oz (0.726 kg)  Intake/Output Summary (Last 24 hours) at 07/22/13 1057 Last data filed at 07/22/13 0437  Gross per 24 hour  Intake 1392.5 ml  Output   2425 ml  Net -1032.5 ml   PEX General: alert, cooperative, and in no apparent distress HEENT: NCAT, vision grossly intact, oropharynx clear and non-erythematous  Neck: supple, no lymphadenopathy Lungs: clear to ascultation bilaterally, normal work of respiration, no wheezes, rales, ronchi Heart: regular rate and rhythm, no murmurs, gallops, or rubs Abdomen: soft, non-tender, non-distended, normal bowel sounds Extremities: 2+ DP/PT pulses bilaterally, no cyanosis, clubbing, or edema Neurologic: alert & oriented X3, cranial nerves II-XII intact, strength grossly intact, sensation intact to light touch  Lab Results: Basic Metabolic Panel:  Recent Labs Lab 07/21/13 1422 07/22/13 0450  NA 126* 134*  K 4.7 4.5  CL 93* 99  CO2 21 25  GLUCOSE 75 80  BUN 10 12  CREATININE 0.55 0.65  CALCIUM 8.9 8.9   Liver Function Tests:  Recent Labs Lab 07/21/13 0453  AST 42*  ALT 30  ALKPHOS 78  BILITOT 0.4  PROT 5.9*  ALBUMIN 3.1*   CBC:  Recent Labs Lab 07/21/13 0453  WBC 4.7  HGB 10.0*  HCT 27.9*  MCV 82.8  PLT 251   Urine Drug Screen: Drugs of Abuse     Component Value Date/Time   LABOPIA NONE DETECTED 07/21/2013 0547   COCAINSCRNUR NONE DETECTED 07/21/2013 0547   LABBENZ NONE DETECTED  07/21/2013 0547   AMPHETMU NONE DETECTED 07/21/2013 0547   THCU NONE DETECTED 07/21/2013 0547   LABBARB NONE DETECTED 07/21/2013 0547     Urinalysis:  Recent Labs Lab 07/21/13 0547  COLORURINE YELLOW  LABSPEC 1.013  PHURINE 7.0  GLUCOSEU NEGATIVE  HGBUR TRACE*  BILIRUBINUR NEGATIVE  KETONESUR NEGATIVE  PROTEINUR NEGATIVE  UROBILINOGEN 1.0  NITRITE POSITIVE*  LEUKOCYTESUR LARGE*   Studies/Results: Dg Chest 2 View  07/22/2013   CLINICAL DATA Concern for aspiration pneumonia  EXAM CHEST  2 VIEW  COMPARISON DG CHEST 1V PORT dated 07/20/2013  FINDINGS VP shunt tube identified over the right thorax. Heart size and vascular pattern are normal. No effusion or pneumothorax. Mild left upper lobe scarring is stable. No abnormal opacities on the right.  IMPRESSION No evidence of pneumonia  SIGNATURE  Electronically Signed   By: Esperanza Heiraymond  Rubner M.D.   On: 07/22/2013 08:15   Dg Chest Port 1 View  07/21/2013   CLINICAL DATA Cough.  EXAM PORTABLE CHEST - 1 VIEW  COMPARISON February 05, 2013.  FINDINGS Cardiomediastinal silhouette appears normal. Right-sided ventriculoperitoneal shunt is again noted. Stable scarring is seen in left upper lobe. No acute pulmonary disease is noted. No pneumothorax or pleural effusion is noted. Bony thorax is intact.  IMPRESSION No acute cardiopulmonary abnormality seen.  SIGNATURE  Electronically Signed  By: Roque Lias M.D.   On: 07/21/2013 00:39   Medications: I have reviewed the patient's current medications. Scheduled Meds: . feeding supplement (OSMOLITE 1.2 CAL)  240 mL Per Tube 5 X Daily  . feeding supplement (PRO-STAT SUGAR FREE 64)  30 mL Per Tube Daily  . ferrous sulfate  300 mg Per Tube Q breakfast  . levofloxacin  750 mg Per Tube Daily  . levothyroxine  75 mcg Per Tube QAC breakfast  . sodium chloride  3 mL Intravenous Q12H   Continuous Infusions: . sodium chloride 75 mL/hr at 07/22/13 0442   PRN Meds:.acetaminophen,  acetaminophen Assessment/Plan: #UTI, improved- Patient denied dysuria but reported frequency and foul smelling urine, both improved today.  UA showed + nitrites, large leucocytes, many bacteria.  UDS negative. -levofloxacin 750 mg x 5 days (pulm coverage as well), will discharge with rx for additional 3 days   #Cough, improved- Per documents sent by NP at [facility]. Still coughing some but denies sputum production. History of aspiration given dysphagia and continuing occasional PO intake despite PEG tube and recommended NPO status.  However, CXR on admission clear, patient afebrile, and lungs CTAB.  Repeat 2 view CXR negative today.  -continue NPO  -aspiration precuations  #Hyponatremia- Likely due to volume depletion given rapid response to IVFs.  Na 124 on admission --> 126 --> 134 this morning (141 in 04/2012, 143 in 03/2013 per [place]). -continue NS at 75 cc/hr through discharge, will provide free water recs through tube per nutrition at discharge -Urine Na, osm pending, serum osm 258  #Generalized weakness, improved- Reportedly ambulates with cane at home. Possibly secondary to deconditioning and malnutrition.  Baseline BP 90/50-60s per [facility].  Weight 113.4 on 05/28/13 now down to 108.  HIV nonreactive.  Spoke to [place], they feel comfortable managing patient with his aunt who is primary caregiver, strongly prefer for him to stay with [name] and aunt in agreement.  ?Iron deficiency anemia, would repeat ferritin in outpatient setting to guide treatment (likely elevated now as acute phase reactant).   -nutrition consult, appreciate recs; continue Osmolite, add Prostat; once IVF discontinued, rec free water flushes of 70 mL free water before and after each bolus  -NS at 75 cc/hr through discharge -PT consult with recommendation for SNF, but patient will remain in [facility] program, see above  #Hypothyroidism- Patient on levothyroxine via PEG at home.  Last TSH in 2013 0.924.  Repeat TSH this admission 0.805  (within normal limits).  -continue home levothyroxine   Dispo:  Anticipated discharge today.   The patient does have a current PCP Roy Bastos, MD) and does need an [facility] hospital follow-up appointment after discharge.   .Services Needed at time of discharge: Y = Yes, Blank = No PT:   OT:   RN:   Equipment:   Other:     LOS: 2 days   Rocco Serene, MD 07/22/2013, 10:57 AM

## 2013-07-22 NOTE — Clinical Social Work Psychosocial (Signed)
Clinical Social Work Department BRIEF PSYCHOSOCIAL ASSESSMENT 07/22/2013  Patient:  [name],[name]     Account Number:  [id number]     Admit date:  07/20/2013  Clinical Social Worker:  Lavell LusterAMPBELL,JOSEPH BRYANT, LCSWA  Date/Time:  07/22/2013 11:05 AM  Referred by:  Physician  Date Referred:  07/22/2013 Referred for  SNF Placement   Other Referral:   Interview type:  Family Other interview type:   Patient's aunt contacted by phone to complete assessment as patient is not oriented and no family at bedside.    PSYCHOSOCIAL DATA Living Status:  FAMILY Admitted from facility:   Level of care:   Primary support name:  Roy FriarHelen [name] [phone] Primary support relationship to patient:  FAMILY Degree of support available:   Pt has great support system from Pt's Universal Healthunt Roy.    CURRENT CONCERNS Current Concerns  Post-Acute Placement   Other Concerns:    SOCIAL WORK ASSESSMENT / PLAN BSW student spoke with Pt's [name] over the phone. BSW student informed Pt's aunt about PT recommending rehab for Pt at a SNF. Pt's aunt  states that she does not want Pt to go to a SNF and she can take care of Pt at home. Pt's aunt states that Pt is with [facility] and [facility] can go with [facility] to take care of his PT needs. [name] states that she has been taking care of patient and believes that she can manage patient's needs from home with the assistance of [facility].    BSW student informed Pt aunt that when Pt is d/c BSW student will contact [facility] to transport Pt back home.   Assessment/plan status:  Psychosocial Support/Ongoing Assessment of Needs Other assessment/ plan:   BSW student will follow-up with  [facility] regarding transportation.   Information/referral to community resources:    PATIENT'S/FAMILY'S RESPONSE TO PLAN OF CARE: Pt's aunt was very excited and optimistic about bringing Pt home. Pt's aunt stated that the Pt's bed is ready for the Pt and that she was cleaning and sanitizing Pt's room right now. Aunt  states that is was very thankful of services given and is ready for the Pt to come back home.       [name] [name] BSW-Intern [name] [phone]

## 2013-07-22 NOTE — Progress Notes (Signed)
Pt to d/c home today, pt will have [facility] (where pt goes to adult day care during the day) will be picking pt up to take to their facility. [facility] will be providing a wheelchair to pick pt up per SW. Copy of pt's d/c instructions and script placed in pt's belonging bag. Family member not present at time of discharge. SW communicated with pt's aunt who is primary care giver for pt, and aware that script and information will be sent with pt.  Pt waiting [facility] transportation to arrive.

## 2013-07-22 NOTE — Progress Notes (Signed)
CSW has reviewed BSW intern's note. CSW signing off at this time.  Roddie McBryant Jannetta Massey, DeltaLCSWA, [place], [phone]

## 2013-07-23 ENCOUNTER — Other Ambulatory Visit: Payer: Self-pay | Admitting: Family Medicine

## 2013-07-23 ENCOUNTER — Ambulatory Visit
Admission: RE | Admit: 2013-07-23 | Discharge: 2013-07-23 | Disposition: A | Discharge status: Home / Self Care | Payer: Medicare (Managed Care) | Source: Ambulatory Visit | Attending: Family Medicine | Admitting: Family Medicine

## 2013-07-23 DIAGNOSIS — R410 Disorientation, unspecified: Secondary | ICD-10-CM

## 2013-07-26 ENCOUNTER — Other Ambulatory Visit: Payer: Medicare (Managed Care)

## 2013-07-26 ENCOUNTER — Encounter (HOSPITAL_COMMUNITY): Payer: Self-pay | Admitting: Emergency Medicine

## 2013-07-26 ENCOUNTER — Emergency Department (HOSPITAL_COMMUNITY): Payer: Medicare (Managed Care)

## 2013-07-26 ENCOUNTER — Inpatient Hospital Stay (HOSPITAL_COMMUNITY)
Admission: EM | Admit: 2013-07-26 | Discharge: 2013-07-28 | DRG: 871 | Disposition: A | Discharge status: Home / Self Care | Payer: Medicare (Managed Care) | Attending: Internal Medicine | Admitting: Internal Medicine

## 2013-07-26 DIAGNOSIS — M109 Gout, unspecified: Secondary | ICD-10-CM | POA: Diagnosis present

## 2013-07-26 DIAGNOSIS — Z87891 Personal history of nicotine dependence: Secondary | ICD-10-CM

## 2013-07-26 DIAGNOSIS — E872 Acidosis, unspecified: Secondary | ICD-10-CM

## 2013-07-26 DIAGNOSIS — I69391 Dysphagia following cerebral infarction: Secondary | ICD-10-CM

## 2013-07-26 DIAGNOSIS — N390 Urinary tract infection, site not specified: Secondary | ICD-10-CM | POA: Diagnosis present

## 2013-07-26 DIAGNOSIS — E039 Hypothyroidism, unspecified: Secondary | ICD-10-CM | POA: Diagnosis present

## 2013-07-26 DIAGNOSIS — T68XXXA Hypothermia, initial encounter: Secondary | ICD-10-CM | POA: Diagnosis present

## 2013-07-26 DIAGNOSIS — R471 Dysarthria and anarthria: Secondary | ICD-10-CM | POA: Diagnosis present

## 2013-07-26 DIAGNOSIS — E41 Nutritional marasmus: Secondary | ICD-10-CM | POA: Diagnosis present

## 2013-07-26 DIAGNOSIS — Z8673 Personal history of transient ischemic attack (TIA), and cerebral infarction without residual deficits: Secondary | ICD-10-CM

## 2013-07-26 DIAGNOSIS — I629 Nontraumatic intracranial hemorrhage, unspecified: Secondary | ICD-10-CM | POA: Diagnosis present

## 2013-07-26 DIAGNOSIS — R531 Weakness: Secondary | ICD-10-CM | POA: Diagnosis present

## 2013-07-26 DIAGNOSIS — R1310 Dysphagia, unspecified: Secondary | ICD-10-CM | POA: Diagnosis present

## 2013-07-26 DIAGNOSIS — Z931 Gastrostomy status: Secondary | ICD-10-CM

## 2013-07-26 DIAGNOSIS — E871 Hypo-osmolality and hyponatremia: Secondary | ICD-10-CM | POA: Diagnosis present

## 2013-07-26 DIAGNOSIS — A419 Sepsis, unspecified organism: Principal | ICD-10-CM | POA: Diagnosis present

## 2013-07-26 DIAGNOSIS — D72829 Elevated white blood cell count, unspecified: Secondary | ICD-10-CM

## 2013-07-26 DIAGNOSIS — Z886 Allergy status to analgesic agent status: Secondary | ICD-10-CM

## 2013-07-26 DIAGNOSIS — N179 Acute kidney failure, unspecified: Secondary | ICD-10-CM | POA: Diagnosis present

## 2013-07-26 DIAGNOSIS — R197 Diarrhea, unspecified: Secondary | ICD-10-CM | POA: Diagnosis present

## 2013-07-26 DIAGNOSIS — Z79899 Other long term (current) drug therapy: Secondary | ICD-10-CM

## 2013-07-26 DIAGNOSIS — Z880 Allergy status to penicillin: Secondary | ICD-10-CM

## 2013-07-26 DIAGNOSIS — E869 Volume depletion, unspecified: Secondary | ICD-10-CM | POA: Diagnosis present

## 2013-07-26 DIAGNOSIS — Z982 Presence of cerebrospinal fluid drainage device: Secondary | ICD-10-CM

## 2013-07-26 DIAGNOSIS — R6520 Severe sepsis without septic shock: Secondary | ICD-10-CM

## 2013-07-26 DIAGNOSIS — E785 Hyperlipidemia, unspecified: Secondary | ICD-10-CM | POA: Diagnosis present

## 2013-07-26 DIAGNOSIS — X31XXXA Exposure to excessive natural cold, initial encounter: Secondary | ICD-10-CM

## 2013-07-26 DIAGNOSIS — E43 Unspecified severe protein-calorie malnutrition: Secondary | ICD-10-CM | POA: Diagnosis present

## 2013-07-26 HISTORY — DX: Cerebral infarction, unspecified: I63.9

## 2013-07-26 LAB — CBC
HCT: 39.2 % (ref 39.0–52.0)
Hemoglobin: 14 g/dL (ref 13.0–17.0)
MCH: 30.2 pg (ref 26.0–34.0)
MCHC: 35.7 g/dL (ref 30.0–36.0)
MCV: 84.5 fL (ref 78.0–100.0)
PLATELETS: 342 10*3/uL (ref 150–400)
RBC: 4.64 MIL/uL (ref 4.22–5.81)
RDW: 12.9 % (ref 11.5–15.5)
WBC: 16.4 10*3/uL — AB (ref 4.0–10.5)

## 2013-07-26 LAB — COMPREHENSIVE METABOLIC PANEL
ALBUMIN: 3.1 g/dL — AB (ref 3.5–5.2)
ALT: 37 U/L (ref 0–53)
AST: 45 U/L — ABNORMAL HIGH (ref 0–37)
Alkaline Phosphatase: 89 U/L (ref 39–117)
BUN: 15 mg/dL (ref 6–23)
CALCIUM: 9.6 mg/dL (ref 8.4–10.5)
CHLORIDE: 98 meq/L (ref 96–112)
CO2: 21 meq/L (ref 19–32)
CREATININE: 1.15 mg/dL (ref 0.50–1.35)
GFR calc Af Amer: 80 mL/min — ABNORMAL LOW (ref >90)
GFR calc non Af Amer: 69 mL/min — ABNORMAL LOW (ref >90)
Glucose, Bld: 191 mg/dL — ABNORMAL HIGH (ref 70–99)
Potassium: 3.9 mEq/L (ref 3.7–5.3)
Sodium: 134 mEq/L — ABNORMAL LOW (ref 137–147)
Total Bilirubin: 0.4 mg/dL (ref 0.3–1.2)
Total Protein: 6.2 g/dL (ref 6.0–8.3)

## 2013-07-26 LAB — LACTIC ACID, PLASMA
LACTIC ACID, VENOUS: 5.7 mmol/L — AB (ref 0.5–2.2)
LACTIC ACID, VENOUS: 2.3 mmol/L — AB (ref 0.5–2.2)

## 2013-07-26 LAB — URINALYSIS, ROUTINE W REFLEX MICROSCOPIC
BILIRUBIN URINE: NEGATIVE
GLUCOSE, UA: NEGATIVE mg/dL
Ketones, ur: NEGATIVE mg/dL
Nitrite: NEGATIVE
Protein, ur: 30 mg/dL — AB
SPECIFIC GRAVITY, URINE: 1.023 (ref 1.005–1.030)
Urobilinogen, UA: 0.2 mg/dL (ref 0.0–1.0)
pH: 5 (ref 5.0–8.0)

## 2013-07-26 LAB — T4, FREE: Free T4: 0.85 ng/dL (ref 0.80–1.80)

## 2013-07-26 LAB — URINE MICROSCOPIC-ADD ON

## 2013-07-26 LAB — TSH: TSH: 1.974 u[IU]/mL (ref 0.350–4.500)

## 2013-07-26 MED ORDER — FREE WATER
100.0000 mL | Freq: Every day | Status: DC
  Administered 2013-07-26 – 2013-07-28 (×10): 100 mL

## 2013-07-26 MED ORDER — LEVOTHYROXINE SODIUM 75 MCG PO TABS
75.0000 ug | ORAL_TABLET | Freq: Every day | ORAL | Status: DC
  Administered 2013-07-27 – 2013-07-28 (×2): 75 ug
  Filled 2013-07-26 (×3): qty 1

## 2013-07-26 MED ORDER — SODIUM CHLORIDE 0.9 % IV BOLUS (SEPSIS)
500.0000 mL | Freq: Once | INTRAVENOUS | Status: AC
  Administered 2013-07-26: 500 mL via INTRAVENOUS

## 2013-07-26 MED ORDER — PRO-STAT SUGAR FREE PO LIQD
30.0000 mL | Freq: Every day | ORAL | Status: DC
  Administered 2013-07-26 – 2013-07-28 (×3): 30 mL
  Filled 2013-07-26 (×3): qty 30

## 2013-07-26 MED ORDER — SODIUM CHLORIDE 0.9 % IV SOLN
INTRAVENOUS | Status: DC
  Administered 2013-07-27 – 2013-07-28 (×2): via INTRAVENOUS

## 2013-07-26 MED ORDER — ACETAMINOPHEN 160 MG/5ML PO SOLN: 325.0000 mg | Freq: Four times a day (QID) | ORAL | Status: DC | PRN

## 2013-07-26 MED ORDER — OSMOLITE 1.2 CAL PO LIQD
240.0000 mL | Freq: Every day | ORAL | Status: DC
  Administered 2013-07-26 – 2013-07-27 (×5): 240 mL
  Administered 2013-07-27: 237 mL
  Administered 2013-07-27 – 2013-07-28 (×2): 240 mL
  Filled 2013-07-26 (×4): qty 474
  Filled 2013-07-26: qty 237
  Filled 2013-07-26 (×7): qty 474
  Filled 2013-07-26 (×2): qty 237
  Filled 2013-07-26 (×3): qty 474
  Filled 2013-07-26: qty 237
  Filled 2013-07-26 (×3): qty 474
  Filled 2013-07-26: qty 237

## 2013-07-26 MED ORDER — ENOXAPARIN SODIUM 40 MG/0.4ML ~~LOC~~ SOLN
40.0000 mg | SUBCUTANEOUS | Status: DC
  Administered 2013-07-26 – 2013-07-27 (×2): 40 mg via SUBCUTANEOUS
  Filled 2013-07-26 (×3): qty 0.4

## 2013-07-26 MED ORDER — DEXTROSE 5 % IV SOLN
1.0000 g | INTRAVENOUS | Status: DC
  Administered 2013-07-26 – 2013-07-27 (×2): 1 g via INTRAVENOUS
  Filled 2013-07-26 (×2): qty 10

## 2013-07-26 NOTE — ED Notes (Signed)
Pt from home with family with hx of stroke. Pt's family called EMS- pt not arousing this morning. Family had to pinch pt to wake him up- very lethargic. Pt is on levofloxacin and has been having diarrhea. Per pt's family- it is difficult to pick up pulse ox and BP on pt. Pt also had G tube. CBG 121 HR 100 Sinus tach, BP 98/60

## 2013-07-26 NOTE — ED Notes (Signed)
Attempted to call report. RN will call back. 

## 2013-07-26 NOTE — H&P (Signed)
Date: 07/26/2013               Patient Name:  Roy FredricksonLeonard R Sura MRN: [id number]  DOB: 1956-04-22 Age / Sex: 58 y.o., male   PCP: Jethro Bastosobert N Koehler, MD         Medical Service: Internal Medicine Teaching Service         Attending Physician: Dr. Aletta EdouardShilpa Bhardwaj    First Contact: Dr. Aundria Rudogers Pager: [phone]  Second Contact: Dr. Burtis JunesSadek Pager: [phone]       After Hours (After 5p/  First Contact Pager: [phone]  weekends / holidays): Second Contact Pager: [phone]   Chief Complaint: Weakness, hypothermia  History of Present Illness:  History obtained via Chart review and discussion with [facility], as patient unable to give a full history. The patient is a 58 yo man, history of intracranial hemorrhage s/p VP shunt, hypothyroidism, presenting with weakness.  The patient was recently discharged 4 days prior to admission, after a hospitalization for UTI (treated with Levaquin), and hyponatremia (due to volume depletion).  Since discharge, the patient's aunt notes persistent symptoms of generalized weakness, confusion (such as trying to exit through a closed door, rather than an open door), loose stools, and difficulty with oral secretions.  Shunt malfunction was suspected, but CT head 3/13 was unremarkable.  On the day of admission, the patient experienced a fall with an episode of coughing, and was brought to the ED.  In the ED, the patient's BP was found to be in the 90's/50's (around patient's baseline), though improved to the 110's/80's after NS 1 L bolus.   The patient was also noted to have a rectal temperature of 94.3, and evidence of UTI.  Meds: Current Facility-Administered Medications  Medication Dose Route Frequency Provider Last Rate Last Dose  . cefTRIAXone (ROCEPHIN) 1 g in dextrose 5 % 50 mL IVPB  1 g Intravenous Q24H Suzi RootsKevin E Steinl, MD 100 mL/hr at 07/26/13 0947 1 g at 07/26/13 [id number]   Current Outpatient Prescriptions  Medication Sig Dispense Refill  . acetaminophen (TYLENOL) 325 MG tablet  325 mg by PEG Tube route every 4 (four) hours as needed (pain).       . Amino Acids-Protein Hydrolys (FEEDING SUPPLEMENT, PRO-STAT SUGAR FREE 64,) LIQD Place 30 mLs into feeding tube daily.  900 mL  0  . levofloxacin (LEVAQUIN) 25 MG/ML solution Place 30 mLs (750 mg total) into feeding tube daily.  100 mL  0  . levothyroxine (SYNTHROID, LEVOTHROID) 75 MCG tablet 75 mcg by PEG Tube route daily.      . Multiple Vitamin (MULTIVITAMIN) LIQD Give 15 mLs by tube daily.      . Nutritional Supplements (FEEDING SUPPLEMENT, OSMOLITE 1.2 CAL,) LIQD Place 240 mLs into feeding tube 5 (five) times daily.    0  . Water For Irrigation, Sterile (FREE WATER) SOLN Before and after feedings (5 times daily) for total of 700 mL daily.        Allergies: Allergies as of 07/26/2013 - Review Complete 07/26/2013  Allergen Reaction Noted  . Ibuprofen  04/15/2012  . Penicillins  04/15/2012   Past Medical History  Diagnosis Date  . 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 VP (ventriculoperitoneal) shunt status 1996 and currently  . Dysphagia     Has had aspiration, PEG tube in place but  pt  has consumed foods per mouth without aspiration after working with  speech therapitst  . Hypothyroidism   . Malnutrition     Has PEG tube un place  . Hyperlipidemia   . History of gout 4/13    right foot  . CVA (cerebral infarction)    Past Surgical History  Procedure Laterality Date  . Ventriculoperitoneal shunt  Oct 14, 1994, October 13, 2009    Has VP shunt in place as of 04/15/12  . Peg tube placement  Oct 14, 2010    Has PEG tube in place as of 04/15/12   History reviewed. No pertinent family history. History   Social History  . Marital Status: Single    Spouse Name: N/A    Number of Children: N/A  . Years of Education: N/A   Occupational  History  . Not on file.   Social History Main Topics  . Smoking status: Former Games developer  . Smokeless tobacco: Former Neurosurgeon    Quit date: 04/15/2004  . Alcohol Use: No  . 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 Sexual Activity: Not on file   Other Topics Concern  . Not on file   Social History Narrative   He lives with his aunt, [name]. [facility] in [facility] since June [facility], 2011. Apparently he was fairly functional after his stroke in 14-Oct-1994. He lived with his mother in [place] until she died in 2003-10-14. He moved to [place] to live with his aunt in 10-14-03. He had gradually declined since the stroke but was much worse in the last few years. At the time of admission to [facility] he just sat in his chair all day watching TV and falling asleep. His muscles had become atrophic and he developed right sided leg contracture in addition to his left sided weakness. He also had choking and aspiration. Since starting [facility] he has made progress with gait, speech, balance, and oral intake.     Review of Systems: Level 5 caveat, unable to obtain due to patient's mental status  Physical Exam: Blood pressure 118/86, pulse 82, temperature 94.3 F (34.6 C), temperature source Rectal, resp. rate 18, height 5\' 9"  (1.753 m), weight 103 lb (46.72 kg), SpO2 99.00%. General: lying in bed, answers questions, but unable to recount today's events HEENT: pupils equal round and reactive to light, vision grossly intact, oropharynx clear and non-erythematous with some secretions noted  Neck: supple Lungs: clear to ascultation bilaterally, normal work of respiration, no wheezes, rales, ronchi Heart: regular rate and rhythm, no murmurs, gallops, or rubs Abdomen: soft, non-tender, non-distended, normal bowel sounds, PEG tube in place without surrounding erythema Extremities: no cyanosis, clubbing, or edema Neurologic: alert, able to  answer questions, moves all 4 extremities spontaneously  Lab results: Basic Metabolic Panel:  Recent Labs  16/10/96 0804  NA 134*  K 3.9  CL 98  CO2 21  GLUCOSE 191*  BUN 15  CREATININE 1.15  CALCIUM 9.6   Liver Function Tests:  Recent Labs  07/26/13 0804  AST 45*  ALT 37  ALKPHOS 89  BILITOT 0.4  PROT 6.2  ALBUMIN 3.1*   CBC:  Recent Labs  07/26/13 0804  WBC 16.4*  HGB 14.0  HCT 39.2  MCV 84.5  PLT 342   Urinalysis:  Recent Labs  07/26/13 0914  COLORURINE YELLOW  LABSPEC 1.023  PHURINE 5.0  GLUCOSEU NEGATIVE  HGBUR SMALL*  BILIRUBINUR NEGATIVE  KETONESUR NEGATIVE  PROTEINUR 30*  UROBILINOGEN 0.2  NITRITE NEGATIVE  LEUKOCYTESUR SMALL*     Imaging results:  Dg Chest Port 1 View  07/26/2013   CLINICAL DATA:  Shortness of Breath, paraplegia  EXAM: PORTABLE CHEST - 1 VIEW  COMPARISON:  07/22/2013  FINDINGS: Cardiomediastinal silhouette is stable. No acute infiltrate or pulmonary edema. Mild thoracic dextroscoliosis again noted. Stable VP shunt catheter position.  IMPRESSION: No active disease.   Electronically Signed   By: Natasha Mead M.D.   On: 07/26/2013 08:08    Other results: EKG: NSR, significant background interference  Assessment & Plan by Problem:  # Sepsis 2/2 UTI - The patient presents with hypothermia and leukocytosis, with UA showing 21-50 WBC's, similar to last admission.  I would expect the pyuria from his previous UTI to have cleared by now.  This may represent recurrent UTI (uses diapers at baseline) vs persistent UTI (perhaps previous UTI resistant to levaquin, urine culture results unavailable). -continue ceftriaxone -start NS @ 75 cc/hr -urine culture sent, await results  # Hypothermia - the patient presents with T = 94.3.  Differential includes environmental exposure (inability to express needs given baseline cranial abnormalities, plus recent acute illness) vs sepsis (persistent pyuria on UA, suggestive of UTI).  Unlikely  hypothyroidism (last TSH = 0.8, though central hypothyroidism is a consideration with patient's intracranial pathology) vs adrenal insufficiency. -warming blanket applied -recheck TSH, with free T4 -check AM cortisol tomorrow to screen for adrenal insufficiency  # Lactic Acidosis - the patient presents with an elevated anion gap = 15, likely due to lactic acidosis (lactic acid = 5.7).  Differential for lactic acidosis includes hypoperfusion due to hypovolemia vs sepsis (UTI). -IVF per above -trend lactic acid -repeat BMET in AM  # AKI - the patient's Cr is elevated to 1.15, from 0.65 at discharge.  Additionally, CBC reveals hemoconcentration, and BP improved after IV fluids, making volume depletion the most likely cause of the patient's AKI (likely secondary to fluid loss through diarrhea). -s/p 1 L NS bolus -continue tube feeds and free water per tube -start NS at 75 cc/hr  # Leukocytosis - likely secondary to UTI, though also with component of hemoconcentration (RBC, platelets also increased compared to last discharge).  The patient has had increased secretions lately, and is at risk for aspiration pneumonia, though CXR on admission showed no signs of this. -treat UTI per above -consider obtaining a 2-view CXR in the morning, after adequate volume resuscitation, to re-evaluate for evidence of aspiration  # Diarrhea - loose stools reported by patient's caregiver.  Given recent hospitalization and antibiotic usage, concern for c diff. -sent c diff PCR  # Prophy - lovenox  Dispo: Disposition is deferred at this time, awaiting improvement of current medical problems. Anticipated discharge in approximately 1-3 day(s).   The patient does have a current PCP [name], MD) and does not need an [facility] hospital follow-up appointment after discharge.  Signed: Linward Headland, MD 07/26/2013, 10:23 AM

## 2013-07-26 NOTE — ED Notes (Signed)
Attempted to call report x2. RN and charge RN unable to take report.

## 2013-07-26 NOTE — ED Notes (Signed)
Did in and out cath on patient dark yellow urine in return 

## 2013-07-26 NOTE — ED Notes (Signed)
Received call from [facility] RN giving update on pt's baseline. Pt normally is alert and oriented, able to walk with a walker- unsteady gait. Pt was recently admitted and discharged on 3/13 for UTI and started on levoquin. Pt's tube feeding was also changed to osmolite. Pt has had diarrhea since the change in feeding and levoquin. Pt received CT of head during admission to evaluate shunt and CT was unremarkable. Pt lives with his aunt who states he has "not been himself" lately. This morning, aunt was giving pt tube feeding and pt became unresponsive and fell to floor and started coughing a lot. Pt's aunt called EMS. Pt is alert and oriented now and per EMS pt was on arrival.

## 2013-07-26 NOTE — ED Provider Notes (Addendum)
CSN: [id number]     Arrival date & time 07/26/13  0716 History   First MD Initiated Contact with Patient 07/26/13 [phone]     Chief Complaint  Patient presents with  . Weakness     (Consider location/radiation/quality/duration/timing/severity/associated sxs/prior Treatment) Patient is a 58 y.o. male presenting with weakness. The history is provided by the patient. The history is limited by the condition of the patient.  Weakness Pertinent negatives include no chest pain, no abdominal pain, no headaches and no shortness of breath.  pt w hx prior hemorrhagic cva 1996, vp shunt, recent admit for hyponatremia and uti, presents from home w altered mental status, generalized weakness. Pt limited historian and family not here on patient arrival - level 5 caveat.  On arrival to ED pt is awake and alert appearing. Pt denies any pain or other c/o. No fever. No report of trauma or fall.  No new meds or sedating meds this morning.     Past Medical History  Diagnosis Date  . 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 VP (ventriculoperitoneal) shunt status 1996 and currently  . 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 Hypothyroidism   . Malnutrition     Has PEG tube un place  . Hyperlipidemia   . History of gout 4/13    right foot  . CVA (cerebral infarction)    Past Surgical History  Procedure Laterality Date  . Ventriculoperitoneal shunt  1996, 2011    Has VP shunt in place as of 04/15/12  . Peg tube placement  09/2010    Has PEG tube in place as of 04/15/12   History reviewed. No pertinent family history. History  Substance Use Topics  . Smoking status: Former Games developer  . Smokeless tobacco:  Former Neurosurgeon    Quit date: 04/15/2004  . Alcohol Use: No    Review of Systems  Constitutional: Negative for fever.  HENT: Negative for congestion.   Eyes: Negative for visual disturbance.  Respiratory: Negative for cough and shortness of breath.   Cardiovascular: Negative for chest pain.  Gastrointestinal: Negative for abdominal pain.  Genitourinary: Negative for dysuria.  Musculoskeletal: Negative for back pain and neck pain.  Skin: Negative for rash.  Neurological: Positive for weakness. Negative for headaches.  Hematological: Does not bruise/bleed easily.  Psychiatric/Behavioral: Negative for agitation.      Allergies  Ibuprofen and Penicillins  Home Medications   Current Outpatient Rx  Name  Route  Sig  Dispense  Refill  . Amino Acids-Protein Hydrolys (FEEDING SUPPLEMENT, PRO-STAT SUGAR FREE 64,) LIQD   Per Tube   Place 30 mLs into feeding tube daily.   900 mL   0   . Iron TABS   PEG Tube   1 tablet by PEG Tube route daily.          Marland Kitchen levofloxacin (LEVAQUIN) 25 MG/ML solution   Per Tube   Place 30 mLs (750 mg total) into feeding tube daily.   100 mL   0   . levothyroxine (SYNTHROID, LEVOTHROID) 75 MCG tablet   PEG Tube   75 mcg by PEG Tube route daily.         . Multiple Vitamin (  MULTIVITAMIN) LIQD   Tube   Give 15 mLs by tube daily.         . Nutritional Supplements (FEEDING SUPPLEMENT, OSMOLITE 1.2 CAL,) LIQD   Per Tube   Place 240 mLs into feeding tube 5 (five) times daily.      0   . Water For Irrigation, Sterile (FREE WATER) SOLN      Before and after feedings (5 times daily) for total of 700 mL daily.          BP 89/72  Temp(Src) 94.5 F (34.7 C) (Rectal)  Resp 23  Ht 5\' 9"  (1.753 m)  Wt 103 lb (46.72 kg)  BMI 15.20 kg/m2 Physical Exam  Nursing note and vitals reviewed. Constitutional: No distress.  Very thin male, awake/alert appearing.   HENT:  Head: Atraumatic.  Nose: Nose normal.  Mouth/Throat: Oropharynx is clear and  moist.  Eyes: Pupils are equal, round, and reactive to light.  Neck: Neck supple. No tracheal deviation present.  No stiffness or rigidity  Cardiovascular: Normal rate, regular rhythm, normal heart sounds and intact distal pulses.   Pulmonary/Chest: Effort normal. No accessory muscle usage. No respiratory distress.  Abdominal: Soft. Bowel sounds are normal. He exhibits no distension.  Genitourinary:  No cva tenderness  Musculoskeletal: Normal range of motion. He exhibits no tenderness.  Neurological: He is alert.  Awake and alert. Responds to questions w one word answer, yes/no, speech is very soft/quiet, slow. Moves bil ext, equal grip.   Skin: Skin is warm and dry. No rash noted. He is not diaphoretic.  Psychiatric: He has a normal mood and affect.    ED Course  Procedures (including critical care time)  Results for orders placed during the hospital encounter of 07/26/13  CBC      Result Value Ref Range   WBC 16.4 (*) 4.0 - 10.5 K/uL   RBC 4.64  4.22 - 5.81 MIL/uL   Hemoglobin 14.0  13.0 - 17.0 g/dL   HCT 60.4  54.0 - 98.1 %   MCV 84.5  78.0 - 100.0 fL   MCH 30.2  26.0 - 34.0 pg   MCHC 35.7  30.0 - 36.0 g/dL   RDW 19.1  47.8 - 29.5 %   Platelets 342  150 - 400 K/uL  COMPREHENSIVE METABOLIC PANEL      Result Value Ref Range   Sodium 134 (*) 137 - 147 mEq/L   Potassium 3.9  3.7 - 5.3 mEq/L   Chloride 98  96 - 112 mEq/L   CO2 21  19 - 32 mEq/L   Glucose, Bld 191 (*) 70 - 99 mg/dL   BUN 15  6 - 23 mg/dL   Creatinine, Ser 6.21  0.50 - 1.35 mg/dL   Calcium 9.6  8.4 - 30.8 mg/dL   Total Protein 6.2  6.0 - 8.3 g/dL   Albumin 3.1 (*) 3.5 - 5.2 g/dL   AST 45 (*) 0 - 37 U/L   ALT 37  0 - 53 U/L   Alkaline Phosphatase 89  39 - 117 U/L   Total Bilirubin 0.4  0.3 - 1.2 mg/dL   GFR calc non Af Amer 69 (*) >90 mL/min   GFR calc Af Amer 80 (*) >90 mL/min  URINALYSIS, ROUTINE W REFLEX MICROSCOPIC      Result Value Ref Range   Color, Urine YELLOW  YELLOW   APPearance HAZY (*) CLEAR    Specific Gravity, Urine 1.023  1.005 - 1.030   pH 5.0  5.0 - 8.0   Glucose, UA NEGATIVE  NEGATIVE mg/dL   Hgb urine dipstick SMALL (*) NEGATIVE   Bilirubin Urine NEGATIVE  NEGATIVE   Ketones, ur NEGATIVE  NEGATIVE mg/dL   Protein, ur 30 (*) NEGATIVE mg/dL   Urobilinogen, UA 0.2  0.0 - 1.0 mg/dL   Nitrite NEGATIVE  NEGATIVE   Leukocytes, UA SMALL (*) NEGATIVE  URINE MICROSCOPIC-ADD ON      Result Value Ref Range   Squamous Epithelial / LPF FEW (*) RARE   WBC, UA 21-50  <3 WBC/hpf   RBC / HPF 11-20  <3 RBC/hpf   Bacteria, UA MANY (*) RARE   Casts HYALINE CASTS (*) NEGATIVE   Dg Chest 2 View  07/22/2013   CLINICAL DATA Concern for aspiration pneumonia  EXAM CHEST  2 VIEW  COMPARISON DG CHEST 1V PORT dated 07/20/2013  FINDINGS VP shunt tube identified over the right thorax. Heart size and vascular pattern are normal. No effusion or pneumothorax. Mild left upper lobe scarring is stable. No abnormal opacities on the right.  IMPRESSION No evidence of pneumonia  SIGNATURE  Electronically Signed   By: Esperanza Heir M.D.   On: 07/22/2013 08:15   Ct Head Wo Contrast  07/23/2013   CLINICAL DATA:  Evaluate VP shunt.  EXAM: CT HEAD WITHOUT CONTRAST  TECHNIQUE: Contiguous axial images were obtained from the base of the skull through the vertex without intravenous contrast.  COMPARISON:  04/15/2012  FINDINGS: Stable right frontal ventriculoperitoneal shunt catheter with its tip coursing across the midline through the third ventricle with the tip in the medial thymic area. Stable ventricular size and configuration. No findings to suggest shunt malfunction. There are stable lacunar-type infarcts in the right basal ganglia area. No extra-axial fluid collections are identified. No CT findings for acute hemispheric infarction an or intracranial mid hemorrhage. No mass lesions. The brainstem and cerebellum are grossly normal.  The bony structures are unremarkable. The visualized paranasal sinuses and mastoid air  cells are clear.  IMPRESSION: Stable position and appearance of the ventriculoperitoneal shunt catheter. Stable size and configuration of the ventricles.  Stable remote lacunar-type basal ganglia infarcts on the right side. No new/acute intracranial findings.   Electronically Signed   By: Loralie Champagne M.D.   On: 07/23/2013 17:29   Dg Chest Port 1 View  07/26/2013   CLINICAL DATA:  Shortness of Breath, paraplegia  EXAM: PORTABLE CHEST - 1 VIEW  COMPARISON:  07/22/2013  FINDINGS: Cardiomediastinal silhouette is stable. No acute infiltrate or pulmonary edema. Mild thoracic dextroscoliosis again noted. Stable VP shunt catheter position.  IMPRESSION: No active disease.   Electronically Signed   By: Natasha Mead M.D.   On: 07/26/2013 08:08   Dg Chest Port 1 View  07/21/2013   CLINICAL DATA Cough.  EXAM PORTABLE CHEST - 1 VIEW  COMPARISON February 05, 2013.  FINDINGS Cardiomediastinal silhouette appears normal. Right-sided ventriculoperitoneal shunt is again noted. Stable scarring is seen in left upper lobe. No acute pulmonary disease is noted. No pneumothorax or pleural effusion is noted. Bony thorax is intact.  IMPRESSION No acute cardiopulmonary abnormality seen.  SIGNATURE  Electronically Signed   By: Roque Lias M.D.   On: 07/21/2013 00:39      MDM  Initial bp low/hypotensive. Temp low, warm blankets, bair hugger.   Iv ns bolus. Labs. Continuous pulse ox and monitor.  Additional ns bolus.  Reviewed nursing notes and prior charts for additional history.   From recent admission, d/c vitals  w bp 94/60.  Pt on prior charts also noted w bps in range 90-110 systolic.   Pt with uti on cath urine.   Rocephin 1 gm iv.  w fluid boluses, bp improved.  Pt alert/awake on recheck.  Given hypotension, hypothermia, uti, altered ms - will admit.  Discussed w teaching service resident.  Additional hx from [facility] - recent diarrhea post abx, diarrhea persists.  Will add d ciff.   CRITICAL CARE re  hypotension, hypothermia, altered mental status, leucocytosis, urinary tract infection.  Performed by: Suzi RootsSTEINL,Knight Oelkers E Total critical care time: 35 Critical care time was exclusive of separately billable procedures and treating other patients. Critical care was necessary to treat or prevent imminent or life-threatening deterioration. Critical care was time spent personally by me on the following activities: development of treatment plan with patient and/or surrogate as well as nursing, discussions with consultants, evaluation of patient's response to treatment, examination of patient, obtaining history from patient or surrogate, ordering and performing treatments and interventions, ordering and review of laboratory studies, ordering and review of radiographic studies, pulse oximetry and re-evaluation of patient's condition.      Suzi RootsKevin E Damiah Mcdonald, MD 07/26/13 [id number]

## 2013-07-27 LAB — CLOSTRIDIUM DIFFICILE BY PCR: CDIFFPCR: NEGATIVE

## 2013-07-27 LAB — URINE CULTURE
Colony Count: NO GROWTH
Culture: NO GROWTH

## 2013-07-27 LAB — BASIC METABOLIC PANEL
BUN: 13 mg/dL (ref 6–23)
CALCIUM: 9.1 mg/dL (ref 8.4–10.5)
CHLORIDE: 108 meq/L (ref 96–112)
CO2: 23 meq/L (ref 19–32)
Creatinine, Ser: 0.85 mg/dL (ref 0.50–1.35)
GFR calc Af Amer: >90 mL/min (ref >90)
GFR calc non Af Amer: >90 mL/min (ref >90)
GLUCOSE: 100 mg/dL — AB (ref 70–99)
POTASSIUM: 4.8 meq/L (ref 3.7–5.3)
Sodium: 141 mEq/L (ref 137–147)

## 2013-07-27 MED ORDER — SULFAMETHOXAZOLE-TRIMETHOPRIM 200-40 MG/5ML PO SUSP
20.0000 mL | Freq: Two times a day (BID) | ORAL | Status: DC
  Administered 2013-07-27 – 2013-07-28 (×3): 20 mL via ORAL
  Filled 2013-07-27 (×6): qty 20

## 2013-07-27 NOTE — Progress Notes (Addendum)
NURSING PROGRESS NOTE  [name] [id number] Transfer Data: 07/27/2013 3:50 PM Attending Provider: Aletta EdouardShilpa Bhardwaj, MD WUJ:[id number],WGNFAOPCP:KOEHLER,[name], MD Code Status: Full  [name] is a 58 y.o. male patient transferred from 2C -No acute distress noted.  -No complaints of shortness of breath.  -No complaints of chest pain.     Last Documented Vital Signs: Blood pressure 113/64, pulse 72, temperature 97.3 F (36.3 C), temperature source Oral, resp. rate 17, height 5\' 9"  (1.753 m), weight 50 kg (110 lb 3.7 oz), SpO2 100.00%.  IV Fluids:  IV in place, occlusive dsg intact without redness, IV cath forearm left, condition patent and no redness normal saline @ 75. Right Hand SL.  Allergies:  Ibuprofen and Penicillins  Past Medical History:   has a past medical history of Intracranial bleeding (1996); VP (ventriculoperitoneal) shunt status (1996 and currently); Dysphagia; Hypothyroidism; Malnutrition; Hyperlipidemia; History of gout (4/13); and CVA (cerebral infarction).  Past Surgical History:   has past surgical history that includes Ventriculoperitoneal shunt (1996, 2011) and PEG tube placement (09/2010).  Social History:   reports that he has quit smoking. He quit smokeless tobacco use about 9 years ago. He reports that he does not drink alcohol or use illicit drugs.  Skin: warm dry intact   Side rails up x 2 Will continue to evaluate and treat per MD orders.

## 2013-07-27 NOTE — Progress Notes (Signed)
Completed report to [facility] to RN Misty StanleyLisa, pt transfering to med-surg bed room [phone]. Pt to receive bolus tube feed and water bolus prior to transfer. Pt will transfer via bed escorted by unit staff.

## 2013-07-27 NOTE — Progress Notes (Signed)
Subjective: [name] is doing better this morning.  Resting comfortably, denies pain or urinary symptoms but reports chronic cough.   Objective: Vital signs in last 24 hours: Filed Vitals:   07/27/13 0500 07/27/13 0700 07/27/13 0845 07/27/13 1216  BP: 99/66 96/73 114/70 117/77  Pulse: 80  81 77  Temp:   98.5 F (36.9 C) 97.3 F (36.3 C)  TempSrc:   Oral Oral  Resp: 21 9 10 19   Height:      Weight:      SpO2: 100%  100% 100%   Weight change:   Intake/Output Summary (Last 24 hours) at 07/27/13 1345 Last data filed at 07/27/13 1217  Gross per 24 hour  Intake 571.25 ml  Output    200 ml  Net 371.25 ml   PEX General: alert, cooperative, and in no apparent distress HEENT: NCAT, vision grossly intact, oropharynx clear and non-erythematous  Neck: supple, no lymphadenopathy Lungs: clear to ascultation bilaterally, normal work of respiration, no wheezes, rales, ronchi Heart: regular rate and rhythm, no murmurs, gallops, or rubs Abdomen: soft, non-tender, non-distended, normal bowel sounds Extremities: 2+ DP/PT pulses bilaterally, no cyanosis, clubbing, or edema Neurologic: alert & oriented X3, cranial nerves II-XII intact, strength grossly intact, sensation intact to light touch  Lab Results: Basic Metabolic Panel:  Recent Labs Lab 07/26/13 0804 07/27/13 0412  NA 134* 141  K 3.9 4.8  CL 98 108  CO2 21 23  GLUCOSE 191* 100*  BUN 15 13  CREATININE 1.15 0.85  CALCIUM 9.6 9.1   Liver Function Tests:  Recent Labs Lab 07/21/13 0453 07/26/13 0804  AST 42* 45*  ALT 30 37  ALKPHOS 78 89  BILITOT 0.4 0.4  PROT 5.9* 6.2  ALBUMIN 3.1* 3.1*   CBC:  Recent Labs Lab 07/21/13 0453 07/26/13 0804  WBC 4.7 16.4*  HGB 10.0* 14.0  HCT 27.9* 39.2  MCV 82.8 84.5  PLT 251 342   CBG:  Recent Labs Lab 07/21/13 0754 07/21/13 1138 07/22/13 0811  GLUCAP 137* 109* 86   Thyroid Function Tests:  Recent Labs Lab 07/26/13 1547  TSH 1.974  FREET4 0.85   Anemia  Panel:  Recent Labs Lab 07/21/13 0619  VITAMINB12 1377*  FERRITIN 156  TIBC 205*  IRON 24*   Urine Drug Screen: Drugs of Abuse     Component Value Date/Time   LABOPIA NONE DETECTED 07/21/2013 0547   COCAINSCRNUR NONE DETECTED 07/21/2013 0547   LABBENZ NONE DETECTED 07/21/2013 0547   AMPHETMU NONE DETECTED 07/21/2013 0547   THCU NONE DETECTED 07/21/2013 0547   LABBARB NONE DETECTED 07/21/2013 0547    Urinalysis:  Recent Labs Lab 07/21/13 0547 07/26/13 0914  COLORURINE YELLOW YELLOW  LABSPEC 1.013 1.023  PHURINE 7.0 5.0  GLUCOSEU NEGATIVE NEGATIVE  HGBUR TRACE* SMALL*  BILIRUBINUR NEGATIVE NEGATIVE  KETONESUR NEGATIVE NEGATIVE  PROTEINUR NEGATIVE 30*  UROBILINOGEN 1.0 0.2  NITRITE POSITIVE* NEGATIVE  LEUKOCYTESUR LARGE* SMALL*    Micro Results: Recent Results (from the past 240 hour(s))  URINE CULTURE     Status: None   Collection Time    07/26/13  9:14 AM      Result Value Ref Range Status   Specimen Description URINE, CATHETERIZED   Final   Special Requests ADDED [id number]   Final   Culture  Setup Time     Final   Value: 07/26/2013 10:58     Performed at [facility]   Colony Count     Final  Value: NO GROWTH     [facility]   Culture     Final   Value: NO GROWTH     Performed at [facility]   Report Status 07/27/2013 FINAL   Final  CLOSTRIDIUM DIFFICILE BY PCR     Status: None   Collection Time    07/27/13  8:46 AM      Result Value Ref Range Status   C difficile by pcr NEGATIVE  NEGATIVE Final   Studies/Results: Dg Chest Port 1 View  07/26/2013   CLINICAL DATA:  Shortness of Breath, paraplegia  EXAM: PORTABLE CHEST - 1 VIEW  COMPARISON:  07/22/2013  FINDINGS: Cardiomediastinal silhouette is stable. No acute infiltrate or pulmonary edema. Mild thoracic dextroscoliosis again noted. Stable VP shunt catheter position.  IMPRESSION: No active disease.   Electronically Signed   By: Natasha MeadLiviu  Pop M.D.   On: 07/26/2013 08:08     Medications: I have reviewed the patient's current medications. Scheduled Meds: . enoxaparin (LOVENOX) injection  40 mg Subcutaneous Q24H  . feeding supplement (OSMOLITE 1.2 CAL)  240 mL Per Tube 5 X Daily  . feeding supplement (PRO-STAT SUGAR FREE 64)  30 mL Per Tube Daily  . free water  100 mL Per Tube 5 X Daily  . levothyroxine  75 mcg Per Tube QAC breakfast  . sulfamethoxazole-trimethoprim  20 mL Oral Q12H   Continuous Infusions: . sodium chloride 75 mL/hr at 07/27/13 1258   PRN Meds:.acetaminophen (TYLENOL) oral liquid 160 mg/5 mL Assessment/Plan: # Sepsis 2/2 UTI, improving - Patient presented with hypothermia and leukocytosis to 16.4, with UA showing 21-50 WBC's, similar to admission last week.  Patient was treated with Levoquin last week, would expect pyuria to have resolved if bacteria not resistant to this antibiotic (previous urine culture unavailable).  Urine culture this admission with no growth (in setting of recent antibiotics).  CXR negative.  -transfer to med-surg in anticipation of discharge tomorrow -discontinue ceftriaxone --> Bactrim suspension 800-160 mg q12h -continue NS @ 75 cc/hr  -BMP in AM  -consider repeat UA outpatient late this week  # Hypothermia, resolved - Patient presented with T = 94.3, none below 97.2 since, now 98.5.  Differential includes environmental exposure (inability to express needs given baseline cranial abnormalities, plus recent acute illness) vs sepsis (see above).  TSH 1.974 (within normal limits), free T4 0.85 (within normal limits) thus hypothyroidism as cause of hypothermia unlikely.  -AM cortisol pending (to screen for adrenal insufficiency)  # Lactic Acidosis, resolved - Patient presented with an elevated anion gap = 15 --> 10 today, likely due to lactic acidosis (5.7 --> 2.3) 2/2 sepsis.   # AKI - Cr on admission elevated to 1.15, from 0.65 at discharge likely due to volume depletion (secondary to fluid loss through diarrhea/loose  stools).  Downtrending today at 0.85.  -IVFs per above -continue tube feeds and free water per tube   # Leukocytosis - Likely secondary to UTI, though also with component of hemoconcentration (RBC, platelets also increased compared to last discharge).  Patient has had increased secretions lately, and is at risk for aspiration pneumonia, though CXR negative.  -treat UTI per above   # Diarrhea - loose stools reported by patient's caregiver.  Given recent hospitalization and antibiotic usage, concern for C. Diff however PCR negative.  Patient has only had 1 stool in past 24 hours. -continue to monitor  Dispo: Disposition is deferred at this time, awaiting improvement of current medical problems.  Anticipated discharge in approximately 1 day(s).   The patient does have a current PCP [name], MD) and does need an [facility] hospital follow-up appointment after discharge.   .Services Needed at time of discharge: Y = Yes, Blank = No PT:   OT:   RN:   Equipment:   Other:     LOS: 1 day   Rocco Serene, MD 07/27/2013, 1:45 PM

## 2013-07-27 NOTE — Progress Notes (Signed)
I was consulted to contact the [facility] social worker informing him/her that anticipated discharge is for tomorrow. I called [facility] and they thanked me for the notice and state they will inform me if they need anything, but that this pt should be fine to discharge tomorrow to family's home and [facility] will pick him up. I am signing off.   Maryclare LabradorJulie Rubin Dais, MSW, [facility] CenterCSWA Clinical Social Worker [phone]

## 2013-07-27 NOTE — Progress Notes (Signed)
Attempted to call pt's aunt at # listed for home, no answer, attempted x2.  Was calling to inform her of pt's new room number.

## 2013-07-27 NOTE — Clinical Documentation Improvement (Signed)
Possible Clinical Conditions?   Encephalopathy (describe type if known)                       Anoxic                       Septic                       Alcoholic                        Hepatic                       Hypertensive                       Metabolic                       Toxic Traumatic brain injury Traumatic brain hemorrhage Drug induced delirium  Hyponatremia / Hypernatremia Poisoning / Overdose Other Condition Cannot Clinically Determine    Risk Factors: Patient presented from home with altered mental status per 3/16 progress notes.   Thank You, Marciano SequinWanda Mathews-Bethea,RN,BSN, Clinical Documentation Specialist:  [phone]  [facility]- [facility] Information [facility]

## 2013-07-27 NOTE — H&P (Signed)
INTERNAL MEDICINE TEACHING ATTENDING NOTE  Day 1 of stay  Patient name: [name]  MRN: [id number] Date of birth: Apr 29, 1956   58 y.o. with prior ICH s/p shunt, hypothyroidism, presenting with generalized weakness and confusion, after being discharged 4 days ago. He was being treated for UTI with levaquin, and currently my team suspects ongoing sepsis as a cause of presenting symptoms in the setting of possible antibiotic resistance. This morning the patient was comfortably lying in bed, when I visited him. He denied any chest pain, palpitations, dysuria, abdominal pain or shortness of breath. He was alert and oriented times 3. His exam was remarkable for dysarthria (chronic), PEG tube placement with no signs of infection at site, clear lungs and normal cardiac exam. Able to move all extremities, but does have residual weakness from prior ICH. Agree with starting ceftriaxone for UTI and sending for cultures. Hypothermia - unclear cause, may be sepsis - resolved. Lactic acidosis - trending down. I have read Dr. Theora GianottiBrown's note and I agree with the plan of care. At this point, we can step down his care and start PO antibiotics. Dc planning for tomorrow if the patient keeps improving.  I have seen and evaluated this patient and discussed it with my IM resident team - Dr. Aundria Rudogers and Manson PasseyBrown. Please see the rest of the plan per resident note from today.   [name] 07/27/2013, 1:49 PM.

## 2013-07-27 NOTE — Progress Notes (Signed)
Aunt returned call and informed of pt's new room location.

## 2013-07-28 LAB — CBC
HCT: 25.6 % — ABNORMAL LOW (ref 39.0–52.0)
HEMOGLOBIN: 9.1 g/dL — AB (ref 13.0–17.0)
MCH: 29.9 pg (ref 26.0–34.0)
MCHC: 35.5 g/dL (ref 30.0–36.0)
MCV: 84.2 fL (ref 78.0–100.0)
Platelets: 243 10*3/uL (ref 150–400)
RBC: 3.04 MIL/uL — AB (ref 4.22–5.81)
RDW: 13.4 % (ref 11.5–15.5)
WBC: 9.7 10*3/uL (ref 4.0–10.5)

## 2013-07-28 LAB — BASIC METABOLIC PANEL
BUN: 10 mg/dL (ref 6–23)
CALCIUM: 8.7 mg/dL (ref 8.4–10.5)
CHLORIDE: 107 meq/L (ref 96–112)
CO2: 24 meq/L (ref 19–32)
Creatinine, Ser: 0.7 mg/dL (ref 0.50–1.35)
GFR calc Af Amer: >90 mL/min (ref >90)
GFR calc non Af Amer: >90 mL/min (ref >90)
GLUCOSE: 83 mg/dL (ref 70–99)
POTASSIUM: 3.7 meq/L (ref 3.7–5.3)
SODIUM: 141 meq/L (ref 137–147)

## 2013-07-28 LAB — GLUCOSE, CAPILLARY: GLUCOSE-CAPILLARY: 77 mg/dL (ref 70–99)

## 2013-07-28 LAB — CORTISOL-AM, BLOOD: CORTISOL - AM: 13 ug/dL (ref 4.3–22.4)

## 2013-07-28 MED ORDER — SULFAMETHOXAZOLE-TRIMETHOPRIM 200-40 MG/5ML PO SUSP: 20.0000 mL | Freq: Two times a day (BID) | ORAL | Status: DC

## 2013-07-28 MED ORDER — OSMOLITE 1.2 CAL PO LIQD
237.0000 mL | Freq: Every day | ORAL | Status: DC
  Administered 2013-07-28 (×2): 237 mL
  Filled 2013-07-28 (×7): qty 237

## 2013-07-28 NOTE — Progress Notes (Signed)
RN contacted family and spoke with Patient's Roy CowerAunt Helen. Helen refusing [facility] to transport patient. RN explained that patient is weaker and would benefit for safety reasons if [facility] transported at d/c. Myriam JacobsonHelen still refusing.

## 2013-07-28 NOTE — Progress Notes (Signed)
Subjective: [name] is resting comfortably, no complaints.  Objective: Vital signs in last 24 hours: Filed Vitals:   07/27/13 1400 07/27/13 1618 07/27/13 2049 07/28/13 0447  BP: 113/64 108/67 99/65 95/59   Pulse: 72 74 75 65  Temp:  97.4 F (36.3 C) 98.2 F (36.8 C) 98.1 F (36.7 C)  TempSrc:  Oral Oral Oral  Resp: 17 15 16 16   Height:      Weight:      SpO2: 100% 100% 98% 99%   Weight change:   Intake/Output Summary (Last 24 hours) at 07/28/13 0847 Last data filed at 07/28/13 0449  Gross per 24 hour  Intake   1880 ml  Output   1050 ml  Net    830 ml   PEX General: alert, cooperative, and in no apparent distress HEENT: NCAT, vision grossly intact, oropharynx clear and non-erythematous  Neck: supple, no lymphadenopathy Lungs: clear to ascultation bilaterally, normal work of respiration, no wheezes, rales, ronchi Heart: regular rate and rhythm, no murmurs, gallops, or rubs Abdomen: soft, non-tender, non-distended, normal bowel sounds Extremities: 2+ DP/PT pulses bilaterally, no cyanosis, clubbing, or edema Neurologic: alert & oriented X3, cranial nerves II-XII intact, strength grossly intact, sensation intact to light touch  Lab Results: Basic Metabolic Panel:  Recent Labs Lab 07/27/13 0412 07/28/13 0435  NA 141 141  K 4.8 3.7  CL 108 107  CO2 23 24  GLUCOSE 100* 83  BUN 13 10  CREATININE 0.85 0.70  CALCIUM 9.1 8.7   Liver Function Tests:  Recent Labs Lab 07/26/13 0804  AST 45*  ALT 37  ALKPHOS 89  BILITOT 0.4  PROT 6.2  ALBUMIN 3.1*   CBC:  Recent Labs Lab 07/26/13 0804 07/28/13 0435  WBC 16.4* 9.7  HGB 14.0 9.1*  HCT 39.2 25.6*  MCV 84.5 84.2  PLT 342 243   CBG:  Recent Labs Lab 07/21/13 1138 07/22/13 0811  GLUCAP 109* 86   Thyroid Function Tests:  Recent Labs Lab 07/26/13 1547  TSH 1.974  FREET4 0.85   Urine Drug Screen: Drugs of Abuse     Component Value Date/Time   LABOPIA NONE DETECTED 07/21/2013 0547   COCAINSCRNUR NONE DETECTED 07/21/2013 0547   LABBENZ NONE DETECTED 07/21/2013 0547   AMPHETMU NONE DETECTED 07/21/2013 0547   THCU NONE DETECTED 07/21/2013 0547   LABBARB NONE DETECTED 07/21/2013 0547    Urinalysis:  Recent Labs Lab 07/26/13 0914  COLORURINE YELLOW  LABSPEC 1.023  PHURINE 5.0  GLUCOSEU NEGATIVE  HGBUR SMALL*  BILIRUBINUR NEGATIVE  KETONESUR NEGATIVE  PROTEINUR 30*  UROBILINOGEN 0.2  NITRITE NEGATIVE  LEUKOCYTESUR SMALL*    Micro Results: Recent Results (from the past 240 hour(s))  URINE CULTURE     Status: None   Collection Time    07/26/13  9:14 AM      Result Value Ref Range Status   Specimen Description URINE, CATHETERIZED   Final   Special Requests ADDED [id number]   Final   Culture  Setup Time     Final   Value: 07/26/2013 10:58     Performed at [facility]   Colony Count     Final   Value: NO GROWTH     Performed at [facility]   Culture     Final   Value: NO GROWTH     Performed at [facility]   Report Status 07/27/2013 FINAL   Final  CLOSTRIDIUM DIFFICILE BY PCR     Status:  None   Collection Time    07/27/13  8:46 AM      Result Value Ref Range Status   C difficile by pcr NEGATIVE  NEGATIVE Final   Medications: I have reviewed the patient's current medications. Scheduled Meds: . enoxaparin (LOVENOX) injection  40 mg Subcutaneous Q24H  . feeding supplement (OSMOLITE 1.2 CAL)  240 mL Per Tube 5 X Daily  . feeding supplement (PRO-STAT SUGAR FREE 64)  30 mL Per Tube Daily  . free water  100 mL Per Tube 5 X Daily  . levothyroxine  75 mcg Per Tube QAC breakfast  . sulfamethoxazole-trimethoprim  20 mL Oral Q12H   Continuous Infusions: . sodium chloride 75 mL/hr at 07/28/13 0426   PRN Meds:.acetaminophen (TYLENOL) oral liquid 160 mg/5 mL Assessment/Plan: # Sepsis 2/2 UTI, improving - Patient presented with hypothermia and leukocytosis to 16.4, with UA showing 21-50 WBC's, similar to admission last week.  Patient was  treated with Levoquin last week, would expect pyuria to have resolved if bacteria not resistant to this antibiotic (previous urine culture unavailable).  Urine culture this admission with no growth (in setting of recent antibiotics).  CXR negative.  -continue Bactrim suspension 800-160 mg q12h (today is antibiotic day 3) for total of 10 days -continue NS @ 75 cc/hr through discharge -consider repeat UA outpatient late this week  # Hypothermia, resolved - Patient presented with T = 94.3, none below 97.2 since, now 98.1.  Differential includes environmental exposure (inability to express needs given baseline cranial abnormalities, plus recent acute illness) vs sepsis (see above).  TSH 1.974 (within normal limits), free T4 0.85 (within normal limits) thus hypothyroidism as cause of hypothermia unlikely.  AM cortisol 13 (within normal limits) thus adrenal insufficiency unlikely.  # Lactic Acidosis, resolved - Patient presented with an elevated anion gap = 15 --> 10, likely due to lactic acidosis (5.7 --> 2.3) 2/2 sepsis.   # AKI, resolved - Cr on admission elevated to 1.15, from 0.65 at discharge likely due to volume depletion (secondary to fluid loss through diarrhea/loose stools).  Downtrending at 0.85 --> 0.7 on day of discharge.  -continue tube feeds and free water via tube per nutrition recs  # Leukocytosis, resolved - Likely secondary to UTI, though also with component of hemoconcentration (RBC, platelets also increased compared to last discharge).  Patient has had increased secretions lately, and is at risk for aspiration pneumonia, though CXR negative.  WBC 9.7 on day of discharge.   # ?Diarrhea - loose stools reported by patient's caregiver.  Given recent hospitalization and antibiotic usage, concern for C. Diff however PCR negative.  Patient has only had 1 stool in past 24 hours.  Dispo: Anticipated discharge today.   The patient does have a current PCP [name](Robert N Koehler, MD) and [facility]   .Services Needed at time of discharge: Y = Yes, Blank = No PT:   OT:   RN:   Equipment:   Other:     LOS: 2 days   Rocco SereneMorgan Devonta Blanford, MD 07/28/2013, 8:47 AM

## 2013-07-28 NOTE — Discharge Summary (Signed)
Name: [name] MRN: [id number] DOB: 1955-06-11 58 y.o. PCP: [name], MD  Date of Admission: 07/26/2013  7:16 AM Date of Discharge: 07/28/2013 Attending Physician: Aletta Edouard, MD  Discharge Diagnosis: Principal Problem:   UTI (urinary tract infection) Active Problems:   Hypothyroidism   Severe malnutrition   Dysphagia due to old stroke   Intracranial bleed, history of   Generalized weakness   Hypothermia  Discharge Medications:   Medication List    STOP taking these medications       levofloxacin 25 MG/ML solution  Commonly known as:  LEVAQUIN      TAKE these medications       acetaminophen 325 MG tablet  Commonly known as:  TYLENOL  325 mg by PEG Tube route every 4 (four) hours as needed (pain).     feeding supplement (OSMOLITE 1.2 CAL) Liqd  Place 240 mLs into feeding tube 5 (five) times daily.     feeding supplement (PRO-STAT SUGAR FREE 64) Liqd  Place 30 mLs into feeding tube daily.     free water Soln  Before and after feedings (5 times daily) for total of 700 mL daily.     levothyroxine 75 MCG tablet  Commonly known as:  SYNTHROID, LEVOTHROID  75 mcg by PEG Tube route daily.     multivitamin Liqd  Give 15 mLs by tube daily.     sulfamethoxazole-trimethoprim 200-40 MG/5ML suspension  Commonly known as:  BACTRIM,SEPTRA  Take 20 mLs by mouth every 12 (twelve) hours.        Disposition and follow-up:   Mr.Treysean R Kennard was discharged from [facility] in Stable condition.  At the hospital follow up visit please address:  1.  How is patient feeling overall?  2.  Labs / imaging needed at time of follow-up: UA  3.  Pending labs/ test needing follow-up: none  Follow-up Appointments: Follow-up Information   Follow up with Thane Edu, MD In 1 week.   Specialty:  Family Medicine   Contact information:   [address] [place] [address] [phone]       Discharge  Instructions: Discharge Orders   Future Orders Complete By Expires   Call MD for:  redness, tenderness, or signs of infection (pain, swelling, redness, odor or green/yellow discharge around incision site)  As directed    Call MD for:  temperature >100.4  As directed    Diet - low sodium heart healthy  As directed    Increase activity slowly  As directed       Consultations:  none  Procedures Performed:  Dg Chest 2 View  07/22/2013   CLINICAL DATA Concern for aspiration pneumonia  EXAM CHEST  2 VIEW  COMPARISON DG CHEST 1V PORT dated 07/20/2013  FINDINGS VP shunt tube identified over the right thorax. Heart size and vascular pattern are normal. No effusion or pneumothorax. Mild left upper lobe scarring is stable. No abnormal opacities on the right.  IMPRESSION No evidence of pneumonia  SIGNATURE  Electronically Signed   By: Esperanza Heir M.D.   On: 07/22/2013 08:15   Ct Head Wo Contrast  07/23/2013   CLINICAL DATA:  Evaluate VP shunt.  EXAM: CT HEAD WITHOUT CONTRAST  TECHNIQUE: Contiguous axial images were obtained from the base of the skull through the vertex without intravenous contrast.  COMPARISON:  04/15/2012  FINDINGS: Stable right frontal ventriculoperitoneal shunt catheter with its tip coursing across the midline through the third ventricle with  the tip in the medial thymic area. Stable ventricular size and configuration. No findings to suggest shunt malfunction. There are stable lacunar-type infarcts in the right basal ganglia area. No extra-axial fluid collections are identified. No CT findings for acute hemispheric infarction an or intracranial mid hemorrhage. No mass lesions. The brainstem and cerebellum are grossly normal.  The bony structures are unremarkable. The visualized paranasal sinuses and mastoid air cells are clear.  IMPRESSION: Stable position and appearance of the ventriculoperitoneal shunt catheter. Stable size and configuration of the ventricles.  Stable remote  lacunar-type basal ganglia infarcts on the right side. No new/acute intracranial findings.   Electronically Signed   By: Loralie ChampagneMark  Gallerani M.D.   On: 07/23/2013 17:29   Dg Chest Port 1 View  07/26/2013   CLINICAL DATA:  Shortness of Breath, paraplegia  EXAM: PORTABLE CHEST - 1 VIEW  COMPARISON:  07/22/2013  FINDINGS: Cardiomediastinal silhouette is stable. No acute infiltrate or pulmonary edema. Mild thoracic dextroscoliosis again noted. Stable VP shunt catheter position.  IMPRESSION: No active disease.   Electronically Signed   By: Natasha MeadLiviu  Pop M.D.   On: 07/26/2013 08:08   Dg Chest Port 1 View  07/21/2013   CLINICAL DATA Cough.  EXAM PORTABLE CHEST - 1 VIEW  COMPARISON February 05, 2013.  FINDINGS Cardiomediastinal silhouette appears normal. Right-sided ventriculoperitoneal shunt is again noted. Stable scarring is seen in left upper lobe. No acute pulmonary disease is noted. No pneumothorax or pleural effusion is noted. Bony thorax is intact.  IMPRESSION No acute cardiopulmonary abnormality seen.  SIGNATURE  Electronically Signed   By: Roque LiasJames  Green M.D.   On: 07/21/2013 00:39    Admission HPI:  History obtained via Chart review and discussion with [facility], as patient unable to give a full history.  The patient is a 58 yo man, history of intracranial hemorrhage s/p VP shunt, hypothyroidism, presenting with weakness. The patient was recently discharged 4 days prior to admission, after a hospitalization for UTI (treated with Levaquin), and hyponatremia (due to volume depletion). Since discharge, the patient's aunt notes persistent symptoms of generalized weakness, confusion (such as trying to exit through a closed door, rather than an open door), loose stools, and difficulty with oral secretions. Shunt malfunction was suspected, but CT head 3/13 was unremarkable. On the day of admission, the patient experienced a fall with an episode of coughing, and was brought to the ED. In the ED, the patient's BP was found  to be in the 90's/50's (around patient's baseline), though improved to the 110's/80's after NS 1 L bolus. The patient was also noted to have a rectal temperature of 94.3, and evidence of UTI   Hospital Course by problem list: 1. Sepsis 2/2 UTI, resolved - Patient presented with hypothermia and leukocytosis to 16.4, with UA showing 21-50 WBC's, similar to admission last week.  Patient was treated with Levoquin last week, would expect pyuria to have resolved if bacteria not resistant to this antibiotic (previous urine culture unavailable). Urine culture this admission with no growth (in setting of recent antibiotics). CXR negative.  Patient was given ceftriaxone from 3/16-17 then converted to Bactrim suspension (via tube) 800-160 mg q12h on 3/17 for 10 days total antibiotic therapy (3/18 day 3).  Patient was also given NS at 75 cc/hr through day of discharge.  Consider repeat UA outpatient later this week.   2. Hypothermia, resolved - Patient presented with T = 94.3 (rectal), no temperatures below 97.2 since, 98.1 on day of discharge.  Differential included  environmental exposure (inability to express needs given baseline cranial abnormalities, plus recent acute illness) vs sepsis (see above).  TSH 1.974 (within normal limits), free T4 0.85 (within normal limits) thus hypothyroidism as cause of hypothermia unlikely.  AM cortisol 13 (within normal limits) thus adrenal insufficiency unlikely.   3. Lactic Acidosis, resolved - Patient presented with elevated anion gap = 15 (--> 10), likely due to lactic acidosis (5.7 --> 2.3) secondary to sepsis.   4. AKI, resolved - Cr on admission elevated to 1.15, from 0.65 at discharge last week, likely due to volume depletion (secondary to fluid loss through diarrhea/loose stools). Downtrending at 0.85 --> 0.7 on day of discharge.  Continue tube feeds and free water via tube per nutrition recs.  5. Leukocytosis, resolved - Likely secondary to UTI, though also with  component of hemoconcentration (RBC, platelets also increased compared to last discharge).  Patient is at risk for aspiration pneumonia, though CXR negative.  WBC 9.7 on day of discharge.   6. ?Diarrhea, resolved - Loose stools reported by patient's caregiver.  Given recent hospitalization and antibiotic usage, concern for C. Diff however PCR negative.  Patient has only had 1 stool (on 3/17) since admission.   Discharge Vitals:   BP 95/59  Pulse 65  Temp(Src) 98.1 F (36.7 C) (Oral)  Resp 16  Ht 5\' 9"  (1.753 m)  Wt 110 lb 3.7 oz (50 kg)  BMI 16.27 kg/m2  SpO2 99%  Discharge Labs:  Results for orders placed during the hospital encounter of 07/26/13 (from the past 24 hour(s))  BASIC METABOLIC PANEL     Status: None   Collection Time    07/28/13  4:35 AM      Result Value Ref Range   Sodium 141  137 - 147 mEq/L   Potassium 3.7  3.7 - 5.3 mEq/L   Chloride 107  96 - 112 mEq/L   CO2 24  19 - 32 mEq/L   Glucose, Bld 83  70 - 99 mg/dL   BUN 10  6 - 23 mg/dL   Creatinine, Ser 8.29  0.50 - 1.35 mg/dL   Calcium 8.7  8.4 - 56.2 mg/dL   GFR calc non Af Amer >90  >90 mL/min   GFR calc Af Amer >90  >90 mL/min  CBC     Status: Abnormal   Collection Time    07/28/13  4:35 AM      Result Value Ref Range   WBC 9.7  4.0 - 10.5 K/uL   RBC 3.04 (*) 4.22 - 5.81 MIL/uL   Hemoglobin 9.1 (*) 13.0 - 17.0 g/dL   HCT 13.0 (*) 86.5 - 78.4 %   MCV 84.2  78.0 - 100.0 fL   MCH 29.9  26.0 - 34.0 pg   MCHC 35.5  30.0 - 36.0 g/dL   RDW 69.6  29.5 - 28.4 %   Platelets 243  150 - 400 K/uL  GLUCOSE, CAPILLARY     Status: None   Collection Time    07/28/13 10:03 AM      Result Value Ref Range   Glucose-Capillary 77  70 - 99 mg/dL    Signed: Rocco Serene, MD 07/28/2013, 11:25 AM   Time Spent on Discharge: 40 minutes Services Ordered on Discharge: none Equipment Ordered on Discharge: none

## 2013-07-28 NOTE — Progress Notes (Signed)
Utilization review completed.  

## 2013-07-28 NOTE — Progress Notes (Signed)
NURSING PROGRESS NOTE  [name] [id number] Discharge Data: 07/28/2013 4:34 PM Attending Provider: Aletta EdouardShilpa Bhardwaj, MD WUJ:WJXBJYN,WGNFAOPCP:KOEHLER,ROBERT Janyth PupaNICHOLAS, MD     [name] to be D/C'd Home with Aunt per MD order.  Discussed with the patient the After Visit Summary and all questions fully answered. All IV's discontinued with no bleeding noted. Skin intact with no opened areas. All belongings returned to patient for patient to take home.   Last Vital Signs:  Blood pressure 95/59, pulse 65, temperature 98.1 F (36.7 C), temperature source Oral, resp. rate 16, height 5\' 9"  (1.753 m), weight 50 kg (110 lb 3.7 oz), SpO2 99.00%.  Discharge Medication List   Medication List    STOP taking these medications       levofloxacin 25 MG/ML solution  Commonly known as:  LEVAQUIN      TAKE these medications       acetaminophen 325 MG tablet  Commonly known as:  TYLENOL  325 mg by PEG Tube route every 4 (four) hours as needed (pain).     feeding supplement (OSMOLITE 1.2 CAL) Liqd  Place 240 mLs into feeding tube 5 (five) times daily.     feeding supplement (PRO-STAT SUGAR FREE 64) Liqd  Place 30 mLs into feeding tube daily.     free water Soln  Before and after feedings (5 times daily) for total of 700 mL daily.     levothyroxine 75 MCG tablet  Commonly known as:  SYNTHROID, LEVOTHROID  75 mcg by PEG Tube route daily.     multivitamin Liqd  Give 15 mLs by tube daily.     sulfamethoxazole-trimethoprim 200-40 MG/5ML suspension  Commonly known as:  BACTRIM,SEPTRA  Take 20 mLs by mouth every 12 (twelve) hours.         Cathlyn Parsonsattha Lynk Marti, RN

## 2013-07-28 NOTE — Progress Notes (Signed)
RN provided discharge teaching to pt and pt's aunt. Expressed the need to contact the doctor for prescription. Pt's aunt agitated stating "[facility] care [facility]" and "wanted to leave now"

## 2013-07-28 NOTE — Discharge Instructions (Signed)
Please complete the course of your new antibiotic called BACTRIM.  We provided a printed prescription for this medicine.    Follow-up with [facility] later this week.   Urinary Tract Infection Urinary tract infections (UTIs) can develop anywhere along your urinary tract. Your urinary tract is your body's drainage system for removing wastes and extra water. Your urinary tract includes two kidneys, two ureters, a bladder, and a urethra. Your kidneys are a pair of bean-shaped organs. Each kidney is about the size of your fist. They are located below your ribs, one on each side of your spine. CAUSES Infections are caused by microbes, which are microscopic organisms, including fungi, viruses, and bacteria. These organisms are so small that they can only be seen through a microscope. Bacteria are the microbes that most commonly cause UTIs. SYMPTOMS  Symptoms of UTIs may vary by age and gender of the patient and by the location of the infection. Symptoms in young women typically include a frequent and intense urge to urinate and a painful, burning feeling in the bladder or urethra during urination. Older women and men are more likely to be tired, shaky, and weak and have muscle aches and abdominal pain. A fever may mean the infection is in your kidneys. Other symptoms of a kidney infection include pain in your back or sides below the ribs, nausea, and vomiting. DIAGNOSIS To diagnose a UTI, your caregiver will ask you about your symptoms. Your caregiver also will ask to provide a urine sample. The urine sample will be tested for bacteria and white blood cells. White blood cells are made by your body to help fight infection. TREATMENT  Typically, UTIs can be treated with medication. Because most UTIs are caused by a bacterial infection, they usually can be treated with the use of antibiotics. The choice of antibiotic and length of treatment depend on your symptoms and the type of bacteria causing your infection. HOME  CARE INSTRUCTIONS  If you were prescribed antibiotics, take them exactly as your caregiver instructs you. Finish the medication even if you feel better after you have only taken some of the medication.  Drink enough water and fluids to keep your urine clear or pale yellow.  Avoid caffeine, tea, and carbonated beverages. They tend to irritate your bladder.  Empty your bladder often. Avoid holding urine for long periods of time.  Empty your bladder before and after sexual intercourse.  After a bowel movement, women should cleanse from front to back. Use each tissue only once. SEEK MEDICAL CARE IF:   You have back pain.  You develop a fever.  Your symptoms do not begin to resolve within 3 days. SEEK IMMEDIATE MEDICAL CARE IF:   You have severe back pain or lower abdominal pain.  You develop chills.  You have nausea or vomiting.  You have continued burning or discomfort with urination. MAKE SURE YOU:   Understand these instructions.  Will watch your condition.  Will get help right away if you are not doing well or get worse. Document Released: 02/06/2005 Document Revised: 10/29/2011 Document Reviewed: 06/07/2011 [facility] Patient Information 2014 [employer] [place].

## 2013-07-28 NOTE — Progress Notes (Signed)
07/28/13 Received call from CoronadoForsha SW with [facility], she spoke with patient's aunt [name] and she stated that the family would be coming at about 3:30 pm to transport the patient home, they did not want [facility] to tranfer the patient. Informed patient's nurse, she is concerned about patient's family being able to transfer the patient safely. Contacted [name] and informed her of nurses concern, contact # for Ms [name] is [phone], RN will contact Ms [name] to discuss transport home. Jacquelynn CreeMary Eliyah Bazzi RN, BSN, CCM       07/28/13 [facility] followed by [facility]. Contacted [facility] [phone]) spoke with patient's social worker [name], they will send transportation at 2pm to transport patent home. Patient's RN aware. Jacquelynn CreeMary Demarquis Osley RN, BSN, CCM

## 2013-07-28 NOTE — Progress Notes (Signed)
    Day 2 of stay      Patient name: [name]  Medical record number: [id number]  Date of birth: [birth year]  I have seen and examined the patient today. I have reviewed Dr Aundria Rudogers note from today and I agree with the documentation. Patient stable for discharge today.  Naomi Fitton 07/28/2013, 12:45 PM.

## 2013-07-28 NOTE — Progress Notes (Signed)
07/28/13 [facility] is followed by [facility]. Contacted [facility] [phone]) spoke with patient's social worker Di KindleForsha, they will send transportation at 2pm to transport patent home. Patient's RN aware. Jacquelynn CreeMary Ben Sanz RN, BSN, CCM

## 2013-07-29 NOTE — Discharge Summary (Signed)
INTERNAL MEDICINE ATTENDING DISCHARGE COSIGN   I evaluated the patient on the day of discharge and discussed the discharge plan with my resident team. I agree with the discharge documentation and disposition.   Aletta EdouardBHARDWAJ, Nikoleta Dady 07/29/2013, 3:13 PM

## 2013-08-27 ENCOUNTER — Emergency Department (HOSPITAL_COMMUNITY): Payer: Medicare (Managed Care)

## 2013-08-27 ENCOUNTER — Encounter (HOSPITAL_COMMUNITY): Payer: Self-pay | Admitting: Emergency Medicine

## 2013-08-27 ENCOUNTER — Inpatient Hospital Stay (HOSPITAL_COMMUNITY)
Admission: EM | Admit: 2013-08-27 | Discharge: 2013-09-01 | DRG: 871 | Disposition: A | Discharge status: Hospice | Payer: Medicare (Managed Care) | Attending: Internal Medicine | Admitting: Internal Medicine

## 2013-08-27 DIAGNOSIS — Z982 Presence of cerebrospinal fluid drainage device: Secondary | ICD-10-CM

## 2013-08-27 DIAGNOSIS — I69991 Dysphagia following unspecified cerebrovascular disease: Secondary | ICD-10-CM

## 2013-08-27 DIAGNOSIS — J310 Chronic rhinitis: Secondary | ICD-10-CM | POA: Diagnosis present

## 2013-08-27 DIAGNOSIS — Z888 Allergy status to other drugs, medicaments and biological substances status: Secondary | ICD-10-CM

## 2013-08-27 DIAGNOSIS — K55029 Acute infarction of small intestine, extent unspecified: Secondary | ICD-10-CM | POA: Diagnosis present

## 2013-08-27 DIAGNOSIS — R109 Unspecified abdominal pain: Secondary | ICD-10-CM

## 2013-08-27 DIAGNOSIS — F1410 Cocaine abuse, uncomplicated: Secondary | ICD-10-CM | POA: Diagnosis present

## 2013-08-27 DIAGNOSIS — E872 Acidosis, unspecified: Secondary | ICD-10-CM | POA: Diagnosis present

## 2013-08-27 DIAGNOSIS — Z87891 Personal history of nicotine dependence: Secondary | ICD-10-CM

## 2013-08-27 DIAGNOSIS — Z931 Gastrostomy status: Secondary | ICD-10-CM

## 2013-08-27 DIAGNOSIS — Z515 Encounter for palliative care: Secondary | ICD-10-CM

## 2013-08-27 DIAGNOSIS — E8809 Other disorders of plasma-protein metabolism, not elsewhere classified: Secondary | ICD-10-CM | POA: Diagnosis present

## 2013-08-27 DIAGNOSIS — Z681 Body mass index (BMI) 19 or less, adult: Secondary | ICD-10-CM

## 2013-08-27 DIAGNOSIS — R0609 Other forms of dyspnea: Secondary | ICD-10-CM | POA: Diagnosis present

## 2013-08-27 DIAGNOSIS — R1310 Dysphagia, unspecified: Secondary | ICD-10-CM | POA: Diagnosis present

## 2013-08-27 DIAGNOSIS — Z66 Do not resuscitate: Secondary | ICD-10-CM | POA: Diagnosis present

## 2013-08-27 DIAGNOSIS — A0472 Enterocolitis due to Clostridium difficile, not specified as recurrent: Secondary | ICD-10-CM | POA: Diagnosis present

## 2013-08-27 DIAGNOSIS — Z8719 Personal history of other diseases of the digestive system: Secondary | ICD-10-CM

## 2013-08-27 DIAGNOSIS — E876 Hypokalemia: Secondary | ICD-10-CM | POA: Diagnosis present

## 2013-08-27 DIAGNOSIS — E785 Hyperlipidemia, unspecified: Secondary | ICD-10-CM | POA: Diagnosis present

## 2013-08-27 DIAGNOSIS — Z880 Allergy status to penicillin: Secondary | ICD-10-CM

## 2013-08-27 DIAGNOSIS — T68XXXA Hypothermia, initial encounter: Secondary | ICD-10-CM | POA: Diagnosis present

## 2013-08-27 DIAGNOSIS — A419 Sepsis, unspecified organism: Principal | ICD-10-CM | POA: Diagnosis present

## 2013-08-27 DIAGNOSIS — M109 Gout, unspecified: Secondary | ICD-10-CM | POA: Diagnosis present

## 2013-08-27 DIAGNOSIS — E46 Unspecified protein-calorie malnutrition: Secondary | ICD-10-CM | POA: Diagnosis present

## 2013-08-27 DIAGNOSIS — E039 Hypothyroidism, unspecified: Secondary | ICD-10-CM | POA: Diagnosis present

## 2013-08-27 DIAGNOSIS — R0989 Other specified symptoms and signs involving the circulatory and respiratory systems: Secondary | ICD-10-CM | POA: Diagnosis present

## 2013-08-27 DIAGNOSIS — K859 Acute pancreatitis without necrosis or infection, unspecified: Secondary | ICD-10-CM

## 2013-08-27 DIAGNOSIS — I69391 Dysphagia following cerebral infarction: Secondary | ICD-10-CM

## 2013-08-27 DIAGNOSIS — I69959 Hemiplegia and hemiparesis following unspecified cerebrovascular disease affecting unspecified side: Secondary | ICD-10-CM

## 2013-08-27 DIAGNOSIS — I10 Essential (primary) hypertension: Secondary | ICD-10-CM | POA: Diagnosis present

## 2013-08-27 DIAGNOSIS — K55059 Acute (reversible) ischemia of intestine, part and extent unspecified: Secondary | ICD-10-CM | POA: Diagnosis present

## 2013-08-27 DIAGNOSIS — E8729 Other acidosis: Secondary | ICD-10-CM | POA: Diagnosis present

## 2013-08-27 DIAGNOSIS — R680 Hypothermia, not associated with low environmental temperature: Secondary | ICD-10-CM | POA: Diagnosis present

## 2013-08-27 DIAGNOSIS — R6520 Severe sepsis without septic shock: Secondary | ICD-10-CM

## 2013-08-27 DIAGNOSIS — J690 Pneumonitis due to inhalation of food and vomit: Secondary | ICD-10-CM | POA: Diagnosis present

## 2013-08-27 DIAGNOSIS — K6389 Other specified diseases of intestine: Secondary | ICD-10-CM

## 2013-08-27 HISTORY — DX: Chronic rhinitis: J31.0

## 2013-08-27 LAB — COMPREHENSIVE METABOLIC PANEL
ALT: 58 U/L — AB (ref 0–53)
AST: 72 U/L — ABNORMAL HIGH (ref 0–37)
Albumin: 2.9 g/dL — ABNORMAL LOW (ref 3.5–5.2)
Alkaline Phosphatase: 111 U/L (ref 39–117)
BUN: 18 mg/dL (ref 6–23)
CALCIUM: 8.7 mg/dL (ref 8.4–10.5)
CO2: 15 meq/L — AB (ref 19–32)
Chloride: 107 mEq/L (ref 96–112)
Creatinine, Ser: 1.17 mg/dL (ref 0.50–1.35)
GFR, EST AFRICAN AMERICAN: 78 mL/min — AB (ref >90)
GFR, EST NON AFRICAN AMERICAN: 68 mL/min — AB (ref >90)
Glucose, Bld: 200 mg/dL — ABNORMAL HIGH (ref 70–99)
POTASSIUM: 3 meq/L — AB (ref 3.7–5.3)
SODIUM: 140 meq/L (ref 137–147)
Total Bilirubin: 0.3 mg/dL (ref 0.3–1.2)
Total Protein: 5.9 g/dL — ABNORMAL LOW (ref 6.0–8.3)

## 2013-08-27 LAB — CBC WITH DIFFERENTIAL/PLATELET
Basophils Absolute: 0.1 10*3/uL (ref 0.0–0.1)
Basophils Relative: 1 % (ref 0–1)
EOS ABS: 0.1 10*3/uL (ref 0.0–0.7)
Eosinophils Relative: 1 % (ref 0–5)
HCT: 33.9 % — ABNORMAL LOW (ref 39.0–52.0)
Hemoglobin: 11.1 g/dL — ABNORMAL LOW (ref 13.0–17.0)
LYMPHS PCT: 29 % (ref 12–46)
Lymphs Abs: 1.9 10*3/uL (ref 0.7–4.0)
MCH: 29.4 pg (ref 26.0–34.0)
MCHC: 32.7 g/dL (ref 30.0–36.0)
MCV: 89.9 fL (ref 78.0–100.0)
Monocytes Absolute: 0.3 10*3/uL (ref 0.1–1.0)
Monocytes Relative: 5 % (ref 3–12)
NEUTROS PCT: 64 % (ref 43–77)
Neutro Abs: 4.1 10*3/uL (ref 1.7–7.7)
PLATELETS: 213 10*3/uL (ref 150–400)
RBC: 3.77 MIL/uL — AB (ref 4.22–5.81)
RDW: 13.9 % (ref 11.5–15.5)
WBC: 6.4 10*3/uL (ref 4.0–10.5)

## 2013-08-27 LAB — LACTIC ACID, PLASMA
Lactic Acid, Venous: 3.7 mmol/L — ABNORMAL HIGH (ref 0.5–2.2)
Lactic Acid, Venous: 4 mmol/L — ABNORMAL HIGH (ref 0.5–2.2)

## 2013-08-27 LAB — LIPASE, BLOOD: Lipase: 29 U/L (ref 11–59)

## 2013-08-27 LAB — I-STAT ARTERIAL BLOOD GAS, ED
Acid-base deficit: 9 mmol/L — ABNORMAL HIGH (ref 0.0–2.0)
BICARBONATE: 16.1 meq/L — AB (ref 20.0–24.0)
O2 Saturation: 96 %
PO2 ART: 83 mmHg (ref 80.0–100.0)
Patient temperature: 96.8
TCO2: 17 mmol/L (ref 0–100)
pCO2 arterial: 29.2 mmHg — ABNORMAL LOW (ref 35.0–45.0)
pH, Arterial: 7.343 — ABNORMAL LOW (ref 7.350–7.450)

## 2013-08-27 LAB — PROCALCITONIN: Procalcitonin: <0.1 ng/mL

## 2013-08-27 LAB — I-STAT CG4 LACTIC ACID, ED
LACTIC ACID, VENOUS: 3.75 mmol/L — AB (ref 0.5–2.2)
Lactic Acid, Venous: 3.98 mmol/L — ABNORMAL HIGH (ref 0.5–2.2)

## 2013-08-27 LAB — TROPONIN I

## 2013-08-27 MED ORDER — SODIUM CHLORIDE 0.9 % IV SOLN
1000.0000 mL | Freq: Once | INTRAVENOUS | Status: AC
  Administered 2013-08-27: 1000 mL via INTRAVENOUS

## 2013-08-27 MED ORDER — SODIUM CHLORIDE 0.9 % IV SOLN
1000.0000 mL | INTRAVENOUS | Status: DC
  Administered 2013-08-27: 1000 mL via INTRAVENOUS

## 2013-08-27 MED ORDER — ENOXAPARIN SODIUM 40 MG/0.4ML ~~LOC~~ SOLN
40.0000 mg | SUBCUTANEOUS | Status: DC
  Administered 2013-08-28: 40 mg via SUBCUTANEOUS
  Filled 2013-08-27: qty 0.4

## 2013-08-27 MED ORDER — DEXTROSE 5 % IV SOLN
1.0000 g | INTRAVENOUS | Status: DC
  Filled 2013-08-27: qty 1

## 2013-08-27 MED ORDER — VANCOMYCIN HCL IN DEXTROSE 1-5 GM/200ML-% IV SOLN
1000.0000 mg | INTRAVENOUS | Status: DC
  Filled 2013-08-27: qty 200

## 2013-08-27 MED ORDER — POTASSIUM CHLORIDE 10 MEQ/100ML IV SOLN
10.0000 meq | INTRAVENOUS | Status: AC
  Administered 2013-08-27 – 2013-08-28 (×5): 10 meq via INTRAVENOUS
  Filled 2013-08-27 (×5): qty 100

## 2013-08-27 MED ORDER — SODIUM CHLORIDE 0.9 % IV SOLN
250.0000 mL | INTRAVENOUS | Status: DC | PRN
Start: 2013-08-27 — End: 2013-09-01

## 2013-08-27 MED ORDER — METRONIDAZOLE IN NACL 5-0.79 MG/ML-% IV SOLN
500.0000 mg | Freq: Four times a day (QID) | INTRAVENOUS | Status: DC
  Administered 2013-08-27 – 2013-08-28 (×3): 500 mg via INTRAVENOUS
  Filled 2013-08-27 (×5): qty 100

## 2013-08-27 MED ORDER — LEVOTHYROXINE SODIUM 100 MCG IV SOLR
37.5000 ug | Freq: Every day | INTRAVENOUS | Status: DC
  Administered 2013-08-28: 37.5 ug via INTRAVENOUS
  Filled 2013-08-27: qty 5

## 2013-08-27 MED ORDER — DEXTROSE 5 % IV SOLN
2.0000 g | Freq: Once | INTRAVENOUS | Status: AC
  Administered 2013-08-27: 2 g via INTRAVENOUS

## 2013-08-27 MED ORDER — VANCOMYCIN HCL IN DEXTROSE 1-5 GM/200ML-% IV SOLN
1000.0000 mg | Freq: Once | INTRAVENOUS | Status: AC
  Administered 2013-08-27: 1000 mg via INTRAVENOUS
  Filled 2013-08-27: qty 200

## 2013-08-27 NOTE — ED Notes (Signed)
i-stat CG4+ 3.75 mmol/L result given to Dr. Jeraldine LootsLockwood

## 2013-08-27 NOTE — ED Notes (Signed)
Dr. Jeraldine LootsLockwood and Resident Domingo MendStirling asked to do a femoral stick due to the fact that phlebotomy is unable to draw blood at several attempts. MD verbalized they would take care of it.

## 2013-08-27 NOTE — ED Notes (Signed)
[facility]

## 2013-08-27 NOTE — Progress Notes (Signed)
I spoke with patient's aunt (who is his HCPOA) to understand patient's baseline mental status- he attends [facility] during the day, and able to talk at baseline.  Upon further family discussion with her daughter in law [name], Roy Rahnihg) who is an ED RN, they feel they better understand [name]'s clinical situation, and believe that he would not want a decreased quality of life from what he has, and would not want to suffer.  They understand he is in critical condition, and state that he would not want to undergo CPR/intubation given these circumstances, just the same as they agree with Dr. Lindie SpruceWyatt that he should not undergo a radical operation.  I clarified that his aunt, [name], makes day to day medical decisions for the patient as well.    Patient's code status is DNR/DNI.  Roy GardnerNeema Peace Jost, MD (IMTS, PGY3)

## 2013-08-27 NOTE — ED Notes (Addendum)
Dr. Lindie SpruceWyatt speaking with aunt via phone.  [facility] Pt alert, NAD, calm, skin W&moist, no dyspnea noted, interactive, acknowledging understanding non-verbally, denies questions or nausea, lying flat supine, eyes open, on venti mask, VSS, admits to pain, agreeable to pain med, [facility]

## 2013-08-27 NOTE — Progress Notes (Signed)
ANTIBIOTIC CONSULT NOTE - INITIAL  Pharmacy Consult for vancomycin + cefepime Indication: rule out sepsis  Allergies  Allergen Reactions  . Ibuprofen     Unknown  . Penicillins     Unknown    Patient Measurements: Height: 5' 8.9" (175 cm) Weight: 110 lb 3.7 oz (50 kg) IBW/kg (Calculated) : 70.47 Adjusted Body Weight:   Vital Signs: Temp: 96.8 F (36 C) (04/17 1714) Temp src: Rectal (04/17 1714) BP: 109/76 mmHg (04/17 1714) Pulse Rate: 87 (04/17 1714) Intake/Output from previous day:   Intake/Output from this shift:    Labs:  Recent Labs  08/27/13 1719  WBC 6.4  HGB 11.1*  PLT 213  CREATININE 1.17   Estimated Creatinine Clearance: 49.3 ml/min (by C-G formula based on Cr of 1.17). No results found for this basename: VANCOTROUGH, VANCOPEAK, VANCORANDOM, GENTTROUGH, GENTPEAK, GENTRANDOM, TOBRATROUGH, TOBRAPEAK, TOBRARND, AMIKACINPEAK, AMIKACINTROU, AMIKACIN,  in the last 72 hours   Microbiology: No results found for this or any previous visit (from the past 720 hour(s)).  Medical History: Past Medical History  Diagnosis Date  . 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 VP (ventriculoperitoneal) shunt status 1996 and currently  . 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 Hypothyroidism   . Malnutrition     Has PEG tube un place  . Hyperlipidemia   . History of gout 4/13    right foot  . CVA (cerebral infarction)   . Chronic rhinitis     Medications:  Anti-infectives   Start     Dose/Rate Route Frequency Ordered Stop   08/28/13 1800  vancomycin (VANCOCIN) IVPB 1000 mg/200 mL premix     1,000 mg 200 mL/hr over 60 Minutes Intravenous Every 24 hours  08/27/13 1817     08/28/13 1800  ceFEPIme (MAXIPIME) 1 g in dextrose 5 % 50 mL IVPB     1 g 100 mL/hr over 30 Minutes Intravenous Every 24 hours 08/27/13 1817     08/27/13 1815  vancomycin (VANCOCIN) IVPB 1000 mg/200 mL premix     1,000 mg 200 mL/hr over 60 Minutes Intravenous  Once 08/27/13 1800     08/27/13 1815  ceFEPIme (MAXIPIME) 2 g in dextrose 5 % 50 mL IVPB     2 g 100 mL/hr over 30 Minutes Intravenous  Once 08/27/13 1802       Assessment: 57 yom presented to the ED after becoming unresponsive at his doctors office. To start empiric vancomycin + cefepime for possible sepsis. Of note, pt has a penicillin allergy with an unknown reaction. MD is aware and ok to proceed with cefepime. Pt is hypothermic and WBC is WNL. Scr is slightly elevated above his normal. Lactic acid is also elevated at 3.98.   Cefepime 4/17>> Vanc 4/17>>  Goal of Therapy:  Vancomycin trough level 15-20 mcg/ml  Plan:  1. Vancomycin 1gm IV Q24H 2. Cefepime 2gm IV x 1 then 1gm IV Q24H 3. F/u renal fxn, C&S, clinical status and trough at [facility]  Cordarro Spinnato Lynn Kimoni Pagliarulo 08/27/2013,6:17 PM

## 2013-08-27 NOTE — ED Notes (Signed)
Attempted report to 2100 (2M10)

## 2013-08-27 NOTE — ED Notes (Signed)
Nasal Trumpet removed.

## 2013-08-27 NOTE — Progress Notes (Addendum)
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  He has baseline slurred speech, only answering yes/no questions at this time.  Reports abdominal pain, denies chest pain, shortness of breath.   Per chart review, earlier patient reported having abdominal pain that began today with diarrhea for past 2-4 weeks with normal passage of gas and no distension, nausea/vomiting, fever, or weight loss.  PCCM evaluated patient but felt he is more appropriate for admission to internal medicine and asked [place] to assume care.   S: Patient lying in bed, only complaining of abdominal pain by nodding.  Making eye contact but not at baseline level of communication.   O: Vitals: T 95.9, BP 105/87, HR 97, RR 40, 97% BMP: Na 140, K 3.0, Cl 107, CO2 15, BUN 18, Cr 1.17, glucose 200; AG 18 CBC: Hgb 11.1, Hct 33.9, WBC 6.4, Plt 213 LFTs: AST 72, ALT 58, Alk pos 111, total bili 0.3, albumin 2.9 Lipase 29 (within normal limits) Lactic acid 3.7 --> 3.98 --> 3.75 (elevated) Troponin negative ABG: pH 7.34, pCO2 29, PO2 83  4/17 CT abd Severe bowel wall pneumatosis involving nearly the entire colon. Large amount of portal venous gas with greatest confluence in the right lobe of liver. Areas of pneumoperitoneum.  4/17 CT head Unremarkable appearing ventriculoperitoneal shunt tubing. 4/17 Skull xray Shunt catheter appears grossly intact in visualized regions.   PEX General: chronically ill-appearing, alert, cooperative, and in mild distress HEENT: NCAT, vision grossly intact, oropharynx clear and non-erythematous  Neck: supple, no lymphadenopathy Lungs: mild respiratory distress, bilateral rhonchi anteriorly Heart: regular rate and rhythm, no murmurs, gallops, or rubs Abdomen: mildly distended, diffuse TTP with some rebound and guarding, + bowel sounds Extremities:  contractures, no cyanosis, clubbing, or edema Neurologic: unable to perform 2/2 patient's mental status   A/P: #Severe sepsis 2/2 necrotic bowel (infectious vs. ischemic)- Patient presents with abdominal pain and ?bloody diarrhea.  He has PEG tube in place due to chronic aspiration, see below.  Patient meets 3/4 SIRS criteria with hypothermia, tachycardia, tachypnea.  On exam, diffuse TTP, mild distension, rebound and guarding (acute abdomen).  CT abdomen showed severe bowel wall pneumatosis involving entire colon; large amount of portal venous gas with greatest confluence in right lobe of liver; areas of pneumoperitoneum.  Surgery consulted in ED.  Patient not a candidate for surgical intervention due to need for radical operation with ostomy and removal of VP shunt.  S/p NS 4L in ED.  Note: pressors would likely not be helpful given ischemic bowel.  -admit to IMTS stepdown -surgery consult, appreciate recs -NS at 125 cc/hr -strict Is/Os -continue Cefepime, vancomycin, Flagyl -C. Difficile PCR pending -palliative consult in AM for goals of care discussion  -patient is DNR/DNI, confirmed by Dr. Burnard Bunting with patient's aunt who is HCPOA; will contact aunt tonight if change in clinical status  #AG metabolic acidosis with lactic acidosis- AG 18 with bicarb 15, lactic acid 3.9, most likely due to necrotic bowel.  -trend lactic acid  -CMP, Mg, Phos, CBC AM  #Hypothermia- Likely secondary to sepsis.  Interestingly, patient had hypothermia on 3/16 admission; TSH and AM cortisol normal at that time.  -warming blanket applied  #Chronic aspiration PNA- Patient is s/p PEG placement, NPO.  On admission, CXR negative.  -NPO -supplemental O2   #Hypothyroidism- On Synthroid 37.5 mcg daily at home, will continue while inpatient.

## 2013-08-27 NOTE — H&P (Signed)
PULMONARY / CRITICAL CARE MEDICINE   Name: [name] MRN: [id number] DOB: Dec 12, 1955    ADMISSION DATE:  08/27/2013 CONSULTATION DATE:  08/27/2013  REFERRING MD :  EDP PRIMARY SERVICE: PCCM  CHIEF COMPLAINT:  Abdominal pain  BRIEF PATIENT DESCRIPTION:   57-yo M with pmh of intracranial bleed s/p VP shunt, peg tube, hypothyroidism, HL, CVA, and gout who presented with abdominal pain, episode of syncope with LOC at PCP office found to be hypotensive responsive to IV fluids with CT image findings of extensive necrotic small and large intestine with pneumatosis who is not a candidate for surgical intervention due to extent of disease.   SIGNIFICANT EVENTS / STUDIES:  4/17 Episode of syncope with LOC at PCP office found to be hypotensive responsive to fluids with extensive necrotic bowel and severe pneumatosis in the colon and peritoneum. Pt not surgical candidate due to extent of disease. 4/17 CT abd Severe bowel wall pneumatosis involving nearly the entire colon. Large amount of portal venous gas with greatest confluence in the right lobe of liver. Areas of pneumoperitoneum. 4/17 CT head Unremarkable appearing ventriculoperitoneal shunt tubing 4/17 Skull view Shunt catheter appears grossly intact in visualized regions.    LINES / TUBES: VP shunt >> Peg tube >>  CULTURES: Blood cultures 4/17 >> C diff PCR 4/17 >>  ANTIBIOTICS: Cefepime 4/17 >> Vancomycin 4/17 >> Flagyl 4/17 >>  HISTORY OF PRESENT ILLNESS:    [name] is a 57-yo M with pmh of intracranial bleed s/p VP shunt, peg tube, hypothyroidism, HL, CVA, and gout who presented with abdominal pain, episode of syncope and LOC at PCP office earlier today. Pt was reported to become limp, slumping over in his wheelchair, and unresponsive with shallow spontaneous breathing. Pt was found to be hypotensive that responsed to IV fluids with elevated lactic acid (3.7) and anion gap metabolic acidosis with ABG revealing pH  7.342, and bicarb 16. CT imaging revealed extensive necrotic small and large intestine with pneumatosis.  Pt was seen by surgery who after discussion with pt's Aunt was deemed not a candidate for surgical intervention due to requirement for radical operation with ostomy and removal of VP shunt.  In the ED pt was found to have improved blood pressure after fluid resuscitation (4 L NS), mild respiratory distress requiring oxygen supplementation, and unable to talk but able to respond with yes/no questions. Pt reported having abdominal pain that began today with diarrhea for past 2-4 weeks with normal passage of gas and no distension, nausea, emesis, fever, or weight loss. PCCM consulted for further management of pt's condition. Pt's HCPOA (Aunt) was contacted via phone who confirmed pt's wishes to be DNR/DNI.    PAST MEDICAL HISTORY :  Past Medical History  Diagnosis Date  . 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 VP (ventriculoperitoneal) shunt status 1996 and currently  . 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 Hypothyroidism   . Malnutrition     Has PEG tube un place  . Hyperlipidemia   . History of gout 4/13    right foot  . CVA (cerebral infarction)   . Chronic rhinitis    Past Surgical History  Procedure Laterality Date  .  Ventriculoperitoneal shunt  1996, 2011    Has VP shunt in place as of 04/15/12  . Peg tube placement  09/2010    Has PEG tube in place as of 04/15/12   Prior to Admission medications   Medication Sig Start Date End Date Taking? Authorizing Provider  acetaminophen (TYLENOL) 325 MG tablet 325 mg by PEG Tube route every 4 (four) hours as needed (pain).     Historical  Provider, MD  Amino Acids-Protein Hydrolys (FEEDING SUPPLEMENT, PRO-STAT SUGAR FREE 64,) LIQD Place 30 mLs into feeding tube daily. 07/22/13   Rocco Serene, MD  levothyroxine (SYNTHROID, LEVOTHROID) 75 MCG tablet 75 mcg by PEG Tube route daily.    Historical Provider, MD  Multiple Vitamin (MULTIVITAMIN) LIQD Give 15 mLs by tube daily.    Historical Provider, MD  Nutritional Supplements (FEEDING SUPPLEMENT, OSMOLITE 1.2 CAL,) LIQD Place 240 mLs into feeding tube 5 (five) times daily. 07/22/13   Rocco Serene, MD  sulfamethoxazole-trimethoprim (BACTRIM,SEPTRA) 200-40 MG/5ML suspension Take 20 mLs by mouth every 12 (twelve) hours. 07/28/13   Rocco Serene, MD  Water For Irrigation, Sterile (FREE WATER) SOLN Before and after feedings (5 times daily) for total of 700 mL daily. 07/22/13   Rocco Serene, MD   Allergies  Allergen Reactions  . Ibuprofen     Unknown  . Penicillins     Unknown    FAMILY HISTORY:  No family history on file. SOCIAL HISTORY:  reports that he has quit smoking. He quit smokeless tobacco use about 9 years ago. He reports that he does not drink alcohol or use illicit drugs.  REVIEW OF SYSTEMS:    Per HPI, full ROS could not be obtained due to pt's status  VITAL SIGNS: Temp:  [96.8 F (36 C)] 96.8 F (36 C) (04/17 1714) Pulse Rate:  [87-89] 89 (04/17 1745) Resp:  [18-44] 27 (04/17 1915) BP: (105-113)/(76-81) 113/77 mmHg (04/17 1915) SpO2:  [88 %-100 %] 94 % (04/17 1918) Weight:  [50 kg (110 lb 3.7 oz)] 50 kg (110 lb 3.7 oz) (04/17 1719) HEMODYNAMICS:   VENTILATOR SETTINGS:   INTAKE / OUTPUT: Intake/Output     04/17 0701 - 04/18 0700   I.V. (mL/kg) 4000 (80)   Total Intake(mL/kg) 4000 (80)   Net +4000         PHYSICAL EXAMINATION: General: ill-appearing thin male with mild respiratory distress on venturi mask  Neuro: unable to talk but responsive and able to follow commands HEENT: EOMI, normocephalic, non-traumatic,  anisocoria R> L Cardiovascular:  tachycardic, normal rhythm   Lungs: ronchi in anterior fields Abdomen: firm, rigid, mildly distended, epigastric tenderness, no guarding or rebound, normal BS. PEG tube with no surrounding erythema Musculoskeletal: contractures  Skin: cool, dry, no edema  LABS:  CBC  Recent Labs Lab 08/27/13 1719  WBC 6.4  HGB 11.1*  HCT 33.9*  PLT 213   Coag's No results found for this basename: APTT, INR,  in the last 168 hours BMET  Recent Labs Lab 08/27/13 1719  NA 140  K 3.0*  CL 107  CO2 15*  BUN 18  CREATININE 1.17  GLUCOSE 200*   Electrolytes  Recent Labs Lab 08/27/13 1719  CALCIUM 8.7   Sepsis Markers  Recent Labs Lab 08/27/13 1719 08/27/13 1733  LATICACIDVEN  --  3.98*  PROCALCITON <0.10  --    ABG  Recent Labs Lab 08/27/13 1906  PHART 7.343*  PCO2ART 29.2*  PO2ART 83.0   Liver Enzymes  Recent Labs Lab 08/27/13 1719  AST 72*  ALT 58*  ALKPHOS 111  BILITOT 0.3  ALBUMIN 2.9*   Cardiac Enzymes  Recent Labs Lab 08/27/13 1719  TROPONINI <0.30   Glucose No results found for this basename: GLUCAP,  in the last 168 hours  Imaging Ct Abdomen Pelvis Wo Contrast  08/27/2013   CLINICAL DATA:  abd pain, hypotension,  EXAM: CT ABDOMEN AND PELVIS WITHOUT CONTRAST  TECHNIQUE: Multidetector CT imaging of the abdomen and pelvis was performed following the standard protocol without intravenous contrast.  COMPARISON:  SP REPLC GASTRO/COLONIC TUBE PERCUT W/FLUORO dated 02/10/2013  FINDINGS: Minimal atelectasis versus scarring within the lung bases.  Severe portal venous gas is appreciated within the liver with greatest confluence in the left lobe. Areas of pneumatosis are identified within the ascending colon, transverse colon and in regions of the sigmoid colon. Air is appreciated within the inferior mesenteric vein and portions of the splenic vein. Areas of pneumoperitoneum scattered throughout the abdomen.  The colon is air filled and distended.  No abdominal  free fluid or loculated fluid collections are appreciated. No abdominal masses nor adenopathy. There is no evidence of abdominal aortic aneurysm.  Ventriculoperitoneal shunt is appreciated curled within the pelvis. A percutaneous gastric feeding tube is appreciated within a decompressed stomach. A small hiatal hernia appreciated.  No aggressive osseous appearing lesions.  IMPRESSION: 1. 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 2. Large amount of portal venous gas with greatest confluence in the right lobe of liver. 3. Critical Value/emergent results were called by telephone at the time of interpretation on 08/27/2013 at 6:00 PM to Dr. Gerhard Munch , who verbally acknowledged these results.  4. These findings are consistent with bowel necrosis infectious versus ischemic. 5. Areas of pneumoperitoneum.   Electronically Signed   By: Salome Holmes M.D.   On: 08/27/2013 18:08   Dg Skull 1-3 Views  08/27/2013   CLINICAL DATA:  Ventriculoperitoneal shunt  EXAM: SKULL - 1-3 VIEW  COMPARISON:  CT obtained earlier in the day  FINDINGS: Frontal and lateral views were obtained. The shunt tip is near the midline somewhat posterior in position. CT earlier in the day show the tip just to the left of the dilated third ventricle. The shunt tubing appears patent in the skull and in the visualized cervical spine regions. No bony lesions are identified.  IMPRESSION: Shunt catheter appears grossly intact in visualized regions.   Electronically Signed   By: [name] M.D.   On: 08/27/2013 20:14   Ct Head Wo Contrast  08/27/2013   CLINICAL DATA:  Altered mental status.  Episode of unresponsiveness.  EXAM: CT HEAD WITHOUT CONTRAST  TECHNIQUE: Contiguous axial images were obtained from the base of the skull through the vertex without contrast.  COMPARISON:  CT head 07/23/2013 most recent. Also 09/21/2010. The oldest scan is dated 10/16/2007.  FINDINGS: Ventriculoperitoneal shunt from a right frontal  approach remains unchanged lying with its tip in the medial thalamus/leftward third ventricle. No worsening or developing hydrocephalus. Moderate sized remote lacunar infarcts affect the right basal ganglia. Generalized cortical atrophy. Remote right frontal cortical infarct. No acute hemorrhage, extra-axial fluid collection, or mass lesion. No visible acute stroke. Shunt tubing appears continuous. No extracranial fluid accumulation. Calvarium otherwise intact. Nasal cannula.  IMPRESSION: Stable ventriculomegaly, likely atrophy. Unremarkable appearing ventriculoperitoneal shunt tubing. Chronic ischemic change. No acute intracranial abnormality.   Electronically Signed   By: [name] M.D.   On: 08/27/2013 17:52   Dg Chest [facility]  08/27/2013   CLINICAL DATA:  Loss of consciousness  EXAM: PORTABLE CHEST - 1 VIEW  COMPARISON:  July 26, 2013  FINDINGS: There is a shunt catheter extending along the right hemithorax. There is no edema or consolidation. Heart size and pulmonary vascularity are normal. No adenopathy. No pneumothorax. No bone lesions.  IMPRESSION: No edema or consolidation. Shunt catheter extending along the right hemithorax.   Electronically Signed   By: Bretta BangWilliam  Woodruff M.D.   On: 08/27/2013 17:32   Dg Abd Portable 1v  08/27/2013   CLINICAL DATA:  Shunt series.  Altered mental status.  EXAM: PORTABLE ABDOMEN - 1 VIEW  COMPARISON:  CT the abdomen 08/27/2013  FINDINGS: Shunt is seen entering abdomen from the right chest, coursing into the pelvis and ending in the left mid abdomen. Shunt is intact.  Gastrostomy tube projects over the midline. Nonobstructive bowel gas. Mottled gas in the upper abdomen, both right and left noted. On prior CT, there is extensive pneumatosis and portal venous gas. Free air seen on prior CT cannot be appreciated by plain film.  IMPRESSION: Shunt catheter is intact.  Mild gas in the upper abdomen compatible with extensive pneumatosis and portal venous gas as seen on  prior CT.   Electronically Signed   By: Charlett NoseKevin  Dover M.D.   On: 08/27/2013 20:03     ASSESSMENT / PLAN:  PULMONARY A: Mild respiratory distress  - normal SpO2 on venturi mask, CXR normal Chronic aspiration pneumonia  P:   Supplemental O2 as needed for comfort No intubation as pt's code status is DNI Monitor for aspiration PNA, keep HOB > 30 degrees  CARDIOVASCULAR A: Hypotension responsive to  IV fluids in setting of ischemic bowel    - currently normotensive, s/p 4 L in ED Prolonged QTc - 605 P:  Avoid QT prolonging medicaitons NS bolus as needed to keep MAP > 65   RENAL A: Anion gap metabolic acidosis with lactic acidosis  - AG 18, most likely due to extensive ischemic bowel Hypokalemia  Hypoalbuminemia  P:   NS 125 ml/hr Follow lactic acid levels  Follow BMP  Replete electrolytes as needed  GASTROINTESTINAL A: Extensive Necrotic Small/Large bowel with pneumatosis   - etiology likely due to acute ischemic bowel in setting of chronic vessel disease with atherosclerosis  (h/o CVA , tobacco, and cocaine abuse). No surgical intervention per surgery/family due to extent of disease.  S/P PEG Tube Transaminitis    - most likely secondary to ischemia  P:   Surgery following NPO, hold tube feeds NS 125 ml/hr Continue empiric antibiotics Follow CMP F/U PCR C. diff  HEMATOLOGIC A:  Normocytic anemia   - reported bloody diarrhea, Hg stable  P:  Monitor CBC    INFECTIOUS A: Possible early sepsis in setting of extensive ischemic bowel and pneumatosis      - currently no leukocytosis, PCT <0.10  Risk of peritonitis History of UTI with sepsis, hospitalized 3/16-3/18 s/p 10 days  bactrim  P:  Continue empiric vanc and cefepime  Start IV flagyl  Follow PCT level F/U blood cultures  Obtain c. 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  Surgical intervention is not a good option for this patient as described in Dr. Dixon Boos note.  We recommend medical management with IV antibiotics, however there is a good chance that this could be a fatal event for this patient.  His HCPOA, his aunt, after discussion with other family members, decided that Mr. Nunziato would not tolerate CPR or another prolonged critical illness.  He has been made DNR/DNI as she felt that would be his wish if he was able to communicate.  We recommend treating pain aggressively while medically treating his bowel necrosis.   Sandi Carne, MD, PhD [facility].

## 2013-08-27 NOTE — ED Provider Notes (Signed)
CSN: [id number]     Arrival date & time 08/27/13  1709 History   First MD Initiated Contact with Patient 08/27/13 1717     Chief Complaint  Patient presents with  . Loss of Consciousness     (Consider location/radiation/quality/duration/timing/severity/associated sxs/prior Treatment) HPI  This is a 58 y.o. gentleman with a history of intracranial bleeding in the late 90s with subsequent brain injury, sp VP shunt, baseline dysphagia, hyperlipidemia, presenting today with altered mental status. Patient was at a clinic visit and was behaving normally at that time. However he was found shortly after slumped over on a chair and unresponsive. EMS was called to the scene and found the patient that was hypotensive, largely unresponsive, without adequate respiratory effort. In route his values were pertinent for hypertension. GCS was 3-4. His blood glucose was 220. On arrival patient is nonverbal; However he does answer yes no questions. He denies chest pain shortness of breath, or fatigue. He endorses abdominal pain.  Past Medical History  Diagnosis Date  . 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 VP (ventriculoperitoneal) shunt status 1996 and currently  . 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 Hypothyroidism   . Malnutrition     Has PEG tube un place  . Hyperlipidemia   . History of gout 4/13    right foot  . CVA (cerebral infarction)   . Chronic rhinitis    Past Surgical History  Procedure Laterality Date  . Ventriculoperitoneal shunt  1996, 2011    Has VP shunt in place as of 04/15/12  . Peg tube placement  09/2010    Has PEG tube in place as of 04/15/12   No family history on  file. History  Substance Use Topics  . Smoking status: Former Games developermoker  . Smokeless tobacco: Former NeurosurgeonUser    Quit date: 04/15/2004  . Alcohol Use: No    Review of Systems    Allergies  Ibuprofen and Penicillins  Home Medications   Prior to Admission medications   Medication Sig Start Date End Date Taking? Authorizing Provider  acetaminophen (TYLENOL) 325 MG tablet 325 mg by PEG Tube route every 4 (four) hours as needed (pain).     Historical Provider, MD  Amino Acids-Protein Hydrolys (FEEDING SUPPLEMENT, PRO-STAT SUGAR FREE 64,) LIQD Place 30 mLs into feeding tube daily. 07/22/13   Rocco SereneMorgan Rogers, MD  levothyroxine (SYNTHROID, LEVOTHROID) 75 MCG tablet 75 mcg by PEG Tube route daily.    Historical Provider, MD  Multiple Vitamin (MULTIVITAMIN) LIQD Give 15 mLs by tube daily.    Historical Provider, MD  Nutritional Supplements (FEEDING SUPPLEMENT, OSMOLITE 1.2 CAL,) LIQD Place 240 mLs into feeding tube 5 (five) times daily. 07/22/13   Rocco SereneMorgan Rogers, MD  sulfamethoxazole-trimethoprim (BACTRIM,SEPTRA) 200-40 MG/5ML suspension Take 20 mLs by mouth every 12 (twelve) hours. 07/28/13   Rocco SereneMorgan Rogers, MD  Water For Irrigation, Sterile (FREE WATER) SOLN Before and after feedings (5 times daily) for total of 700 mL daily. 07/22/13   Rocco SereneMorgan Rogers, MD   BP 109/76  Pulse 87  Temp(Src) 96.8 F (36 C) (Rectal)  Resp 18  SpO2 100% Physical Exam  Constitutional: He is oriented to person, place, and time. He  appears well-developed and well-nourished. No distress.  HENT:  Head: Normocephalic and atraumatic.  Mouth/Throat: No oropharyngeal exudate.  Eyes: Conjunctivae and EOM are normal. Pupils are equal, round, and reactive to light. No scleral icterus.  Neck: Normal range of motion. No tracheal deviation present. No thyromegaly present.  Cardiovascular: Normal rate, regular rhythm and normal heart sounds.  Exam reveals no gallop and no friction rub.   No murmur heard. Pulmonary/Chest: Effort normal  and breath sounds normal. No stridor. No respiratory distress. He has no wheezes. He has no rales. He exhibits no tenderness.  Abdominal: Soft. He exhibits distension. He exhibits no mass. There is no rebound and no guarding.  Hypertympanic.  The G tube site is intact, clean, dry, without SOIs.  Musculoskeletal: Normal range of motion. He exhibits no edema.  Neurological: He is alert and oriented to person, place, and time.  Skin: Skin is warm and dry. He is not diaphoretic.    ED Course  Procedures (including critical care time) Labs Review Labs Reviewed  CBC WITH DIFFERENTIAL - Abnormal; Notable for the following:    RBC 3.77 (*)    Hemoglobin 11.1 (*)    HCT 33.9 (*)    All other components within normal limits  COMPREHENSIVE METABOLIC PANEL - Abnormal; Notable for the following:    Potassium 3.0 (*)    CO2 15 (*)    Glucose, Bld 200 (*)    Total Protein 5.9 (*)    Albumin 2.9 (*)    AST 72 (*)    ALT 58 (*)    GFR calc non Af Amer 68 (*)    GFR calc Af Amer 78 (*)    All other components within normal limits  LACTIC ACID, PLASMA - Abnormal; Notable for the following:    Lactic Acid, Venous 3.7 (*)    All other components within normal limits  LACTIC ACID, PLASMA - Abnormal; Notable for the following:    Lactic Acid, Venous 4.0 (*)    All other components within normal limits  I-STAT CG4 LACTIC ACID, ED - Abnormal; Notable for the following:    Lactic Acid, Venous 3.98 (*)    All other components within normal limits  I-STAT CG4 LACTIC ACID, ED - Abnormal; Notable for the following:    Lactic Acid, Venous 3.75 (*)    All other components within normal limits  I-STAT ARTERIAL BLOOD GAS, ED - Abnormal; Notable for the following:    pH, Arterial 7.343 (*)    pCO2 arterial 29.2 (*)    Bicarbonate 16.1 (*)    Acid-base deficit 9.0 (*)    All other components within normal limits  CULTURE, BLOOD (ROUTINE X 2)  CULTURE, BLOOD (ROUTINE X 2)  URINE CULTURE  MRSA PCR  SCREENING  CLOSTRIDIUM DIFFICILE BY PCR  TROPONIN I  LIPASE, BLOOD  PROCALCITONIN  URINALYSIS, ROUTINE W REFLEX MICROSCOPIC  BLOOD GAS, ARTERIAL  CBC  MAGNESIUM  PHOSPHORUS  COMPREHENSIVE METABOLIC PANEL    Imaging Review Ct Abdomen Pelvis Wo Contrast  08/27/2013   CLINICAL DATA:  abd pain, hypotension,  EXAM: CT ABDOMEN AND PELVIS WITHOUT CONTRAST  TECHNIQUE: Multidetector CT imaging of the abdomen and pelvis was performed following the standard protocol without intravenous contrast.  COMPARISON:  SP REPLC GASTRO/COLONIC TUBE PERCUT W/FLUORO dated 02/10/2013  FINDINGS: Minimal atelectasis versus scarring within the lung bases.  Severe portal venous gas is appreciated within the liver with greatest confluence in the left lobe. Areas of pneumatosis are identified within the  ascending colon, transverse colon and in regions of the sigmoid colon. Air is appreciated within the inferior mesenteric vein and portions of the splenic vein. Areas of pneumoperitoneum scattered throughout the abdomen.  The colon is air filled and distended.  No abdominal free fluid or loculated fluid collections are appreciated. No abdominal masses nor adenopathy. There is no evidence of abdominal aortic aneurysm.  Ventriculoperitoneal shunt is appreciated curled within the pelvis. A percutaneous gastric feeding tube is appreciated within a decompressed stomach. A small hiatal hernia appreciated.  No aggressive osseous appearing lesions.  IMPRESSION: 1. 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 2. Large amount of portal venous gas with greatest confluence in the right lobe of liver. 3. Critical Value/emergent results were called by telephone at the time of interpretation on 08/27/2013 at 6:00 PM to Dr. Gerhard Munch , who verbally acknowledged these results.  4. These findings are consistent with bowel necrosis infectious versus ischemic. 5. Areas of pneumoperitoneum.   Electronically Signed   By: Salome Holmes M.D.   On: 08/27/2013 18:08   Dg Skull 1-3 Views  08/27/2013   CLINICAL DATA:  Ventriculoperitoneal shunt  EXAM: SKULL - 1-3 VIEW  COMPARISON:  CT obtained earlier in the day  FINDINGS: Frontal and lateral views were obtained. The shunt tip is near the midline somewhat posterior in position. CT earlier in the day show the tip just to the left of the dilated third ventricle. The shunt tubing appears patent in the skull and in the visualized cervical spine regions. No bony lesions are identified.  IMPRESSION: Shunt catheter appears grossly intact in visualized regions.   Electronically Signed   By: [name] M.D.   On: 08/27/2013 20:14   Ct Head Wo Contrast  08/27/2013   CLINICAL DATA:  Altered mental status.  Episode of unresponsiveness.  EXAM: CT HEAD WITHOUT CONTRAST  TECHNIQUE: Contiguous axial images were obtained from the base of the skull through the vertex without contrast.  COMPARISON:  CT head 07/23/2013 most recent. Also 09/21/2010. The oldest scan is dated 10/16/2007.  FINDINGS: Ventriculoperitoneal shunt from a right frontal approach remains unchanged lying with its tip in the medial thalamus/leftward third ventricle. No worsening or developing hydrocephalus. Moderate sized remote lacunar infarcts affect the right basal ganglia. Generalized cortical atrophy. Remote right frontal cortical infarct. No acute hemorrhage, extra-axial fluid collection, or mass lesion. No visible acute stroke. Shunt tubing appears continuous. No extracranial fluid accumulation. Calvarium otherwise intact. Nasal cannula.  IMPRESSION: Stable ventriculomegaly, likely atrophy. Unremarkable appearing ventriculoperitoneal shunt tubing. Chronic ischemic change. No acute intracranial abnormality.   Electronically Signed   By: [name] M.D.   On: 08/27/2013 17:52   Dg Chest Port 1 View  08/27/2013   CLINICAL DATA:  Loss of consciousness  EXAM: PORTABLE CHEST - 1 VIEW  COMPARISON:  July 26, 2013  FINDINGS:  There is a shunt catheter extending along the right hemithorax. There is no edema or consolidation. Heart size and pulmonary vascularity are normal. No adenopathy. No pneumothorax. No bone lesions.  IMPRESSION: No edema or consolidation. Shunt catheter extending along the right hemithorax.   Electronically Signed   By: [name] M.D.   On: 08/27/2013 17:32   Dg Abd Portable 1v  08/27/2013   CLINICAL DATA:  Shunt series.  Altered mental status.  EXAM: PORTABLE ABDOMEN - 1 VIEW  COMPARISON:  CT the abdomen 08/27/2013  FINDINGS: Shunt is seen entering abdomen from the right chest, coursing into the pelvis  and ending in the left mid abdomen. Shunt is intact.  Gastrostomy tube projects over the midline. Nonobstructive bowel gas. Mottled gas in the upper abdomen, both right and left noted. On prior CT, there is extensive pneumatosis and portal venous gas. Free air seen on prior CT cannot be appreciated by plain film.  IMPRESSION: Shunt catheter is intact.  Mild gas in the upper abdomen compatible with extensive pneumatosis and portal venous gas as seen on prior CT.   Electronically Signed   By: Charlett NoseKevin  Dover M.D.   On: 08/27/2013 20:03     EKG Interpretation   Date/Time:  Friday August 27 2013 17:12:06 EDT Ventricular Rate:  90 PR Interval:  160 QRS Duration: 97 QT Interval:  494 QTC Calculation: 605 R Axis:   62 Text Interpretation:  Age not entered, assumed to be  58 years old for  purpose of ECG interpretation Sinus rhythm Ventricular premature complex  Aberrant conduction of SV complex(es) Consider right atrial enlargement  Repol abnrm, prob ischemia, anterolateral lds ST elevation, consider  inferior injury Prolonged QT interval Baseline wander in lead(s) V5 Sinus  rhythm T wave abnormality Artifact Abnormal ekg Confirmed by Gerhard MunchLOCKWOOD,  ROBERT  MD [phone]([id number]) on 08/27/2013 5:26:37 PM      MDM   Final diagnoses:  None    This is a 58 y.o. gentleman with a history of intracranial bleeding  in the late 90s with subsequent brain injury, sp VP shunt, baseline dysphagia, hyperlipidemia, presenting today with altered mental status. Patient was at a clinic visit and was behaving normally at that time. However he was found shortly after slumped over on a chair and unresponsive. EMS was called to the scene and found the patient that was hypotensive, largely unresponsive, without adequate respiratory effort. In route his values were pertinent for hypertension. GCS was 3-4. His blood glucose was 220. On arrival patient is nonfocal; However he does answer yes no questions. He denies chest pain shortness of breath, or fatigue. He endorses abdominal pain.  Mr. [name] was admitted around a month ago for urosepsis, after presenting in similar manner.    At this time the differential diagnosis is broad. Must consider shunt malfunction or any other intracranial process at this time.  Considering hypothermia, history of sepsis, as well as asymmetrical breath sounds, abdominal pain, must consider sepsis either due to intra-abdominal, urinary, or pulmonary etiologies.  Differential includes stroke, acidosis, intoxication, withdrawal. These are lower on the differential at this time.  Patient has a lactate of 3.98.  Will continue to fluid resuscitate.  Sepsis remains number one on differential.  CT abdomen reveals severe bowel wall pneumatosis, large amount of portal venous gas. The patient is likely septic due to abdominal source. Vancomycin and Zosyn have been ordered. Fluid resuscitation is being continued. I have spoken with both critical care and surgery. Patient will be admitted to critical care. Will continue to trend out lactate, monitor patient closely. He is now requiring nonrebreather mask in order to oxygenate adequately. His blood pressure remained stable. His respiratory drive remains intact. His mental status remained stable.  Patient is being admitted to the ICU.  I have discussed case and care  has been guided by my attending physician, Dr. Jeraldine LootsLockwood.    Loma BostonStirling Jeramyah Goodpasture, MD 08/28/13 [phone]

## 2013-08-27 NOTE — Consult Note (Signed)
Reason for Consult:Abdominal pain and pneumatosis Referring Physician: Wei Poplaski is an 58 y.o. male.  HPI: Patient admitted after passing out in doctor's office, had some mild to moderate abdominal pain, CT done which showed extensive pneumatosis and portal venous gas consist net with significant amount of dead bowel, extending from the 1/3 to 1/2 of the small bowel to the splenic flexure of the colon.  He has bloody diarrhea and free air.  His lactic acid level is about 4 with a bicarbonate level of 15.  He down plays his abdominal pain.  Past Medical History  Diagnosis Date  . 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 VP (ventriculoperitoneal) shunt status 1996 and currently  . 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 Hypothyroidism   . Malnutrition     Has PEG tube un place  . Hyperlipidemia   . History of gout 4/13    right foot  . CVA (cerebral infarction)   . Chronic rhinitis     Past Surgical History  Procedure Laterality Date  . Ventriculoperitoneal shunt  1996, 2011    Has VP shunt in place as of 04/15/12  . Peg tube placement  09/2010    Has PEG tube in place as of 04/15/12    No family history on file.  Social History:  reports that he has quit smoking. He quit smokeless tobacco use about 9 years ago. He reports that he does not drink alcohol or use illicit drugs.  Allergies:  Allergies  Allergen Reactions  . Ibuprofen     Unknown  . Penicillins     Unknown    Medications: I have reviewed the patient's current medications.  Results for orders placed during the hospital encounter of 08/27/13 (from the past 48 hour(s))  CBC WITH DIFFERENTIAL      Status: Abnormal   Collection Time    08/27/13  5:19 PM      Result Value Ref Range   WBC 6.4  4.0 - 10.5 K/uL   RBC 3.77 (*) 4.22 - 5.81 MIL/uL   Hemoglobin 11.1 (*) 13.0 - 17.0 g/dL   HCT 33.9 (*) 39.0 - 52.0 %   MCV 89.9  78.0 - 100.0 fL   MCH 29.4  26.0 - 34.0 pg   MCHC 32.7  30.0 - 36.0 g/dL   RDW 13.9  11.5 - 15.5 %   Platelets 213  150 - 400 K/uL   Neutrophils Relative % 64  43 - 77 %   Neutro Abs 4.1  1.7 - 7.7 K/uL   Lymphocytes Relative 29  12 - 46 %   Lymphs Abs 1.9  0.7 - 4.0 K/uL   Monocytes Relative 5  3 - 12 %   Monocytes Absolute 0.3  0.1 - 1.0 K/uL   Eosinophils Relative 1  0 - 5 %   Eosinophils Absolute 0.1  0.0 - 0.7 K/uL   Basophils Relative 1  0 - 1 %   Basophils Absolute 0.1  0.0 - 0.1 K/uL  COMPREHENSIVE METABOLIC PANEL     Status: Abnormal   Collection Time    08/27/13  5:19 PM      Result Value  Ref Range   Sodium 140  137 - 147 mEq/L   Potassium 3.0 (*) 3.7 - 5.3 mEq/L   Chloride 107  96 - 112 mEq/L   CO2 15 (*) 19 - 32 mEq/L   Glucose, Bld 200 (*) 70 - 99 mg/dL   BUN 18  6 - 23 mg/dL   Creatinine, Ser 1.17  0.50 - 1.35 mg/dL   Calcium 8.7  8.4 - 10.5 mg/dL   Total Protein 5.9 (*) 6.0 - 8.3 g/dL   Albumin 2.9 (*) 3.5 - 5.2 g/dL   AST 72 (*) 0 - 37 U/L   ALT 58 (*) 0 - 53 U/L   Alkaline Phosphatase 111  39 - 117 U/L   Total Bilirubin 0.3  0.3 - 1.2 mg/dL   GFR calc non Af Amer 68 (*) >90 mL/min   GFR calc Af Amer 78 (*) >90 mL/min   Comment: (NOTE)     The eGFR has been calculated using the CKD EPI equation.     This calculation has not been validated in all clinical situations.     eGFR's persistently <90 mL/min signify possible Chronic Kidney     Disease.  TROPONIN I     Status: None   Collection Time    08/27/13  5:19 PM      Result Value Ref Range   Troponin I <0.30  <0.30 ng/mL   Comment:            Due to the release kinetics of cTnI,     a negative result within the first hours     of the onset of symptoms does not rule out      myocardial infarction with certainty.     If myocardial infarction is still suspected,     repeat the test at appropriate intervals.  LIPASE, BLOOD     Status: None   Collection Time    08/27/13  5:19 PM      Result Value Ref Range   Lipase 29  11 - 59 U/L  PROCALCITONIN     Status: None   Collection Time    08/27/13  5:19 PM      Result Value Ref Range   Procalcitonin <0.10     Comment:            Interpretation:     PCT (Procalcitonin) <= 0.5 ng/mL:     Systemic infection (sepsis) is not likely.     Local bacterial infection is possible.     (NOTE)             ICU PCT Algorithm               Non ICU PCT Algorithm        ----------------------------     ------------------------------             PCT < 0.25 ng/mL                 PCT < 0.1 ng/mL         Stopping of antibiotics            Stopping of antibiotics           strongly encouraged.               strongly encouraged.        ----------------------------     ------------------------------           PCT level decrease by  PCT < 0.25 ng/mL           >= 80% from peak PCT           OR PCT 0.25 - 0.5 ng/mL          Stopping of antibiotics                                                 encouraged.         Stopping of antibiotics               encouraged.        ----------------------------     ------------------------------           PCT level decrease by              PCT >= 0.25 ng/mL           < 80% from peak PCT            AND PCT >= 0.5 ng/mL            Continuing antibiotics                                                  encouraged.           Continuing antibiotics                encouraged.        ----------------------------     ------------------------------         PCT level increase compared          PCT > 0.5 ng/mL             with peak PCT AND              PCT >= 0.5 ng/mL             Escalation of antibiotics                                              strongly encouraged.          Escalation  of antibiotics            strongly encouraged.  I-STAT CG4 LACTIC ACID, ED     Status: Abnormal   Collection Time    08/27/13  5:33 PM      Result Value Ref Range   Lactic Acid, Venous 3.98 (*) 0.5 - 2.2 mmol/L  I-STAT ARTERIAL BLOOD GAS, ED     Status: Abnormal   Collection Time    08/27/13  7:06 PM      Result Value Ref Range   pH, Arterial 7.343 (*) 7.350 - 7.450   pCO2 arterial 29.2 (*) 35.0 - 45.0 mmHg   pO2, Arterial 83.0  80.0 - 100.0 mmHg   Bicarbonate 16.1 (*) 20.0 - 24.0 mEq/L   TCO2 17  0 - 100 mmol/L   O2 Saturation 96.0     Acid-base deficit 9.0 (*) 0.0 - 2.0 mmol/L   Patient temperature 96.8 F     Collection site RADIAL, ALLEN'S  TEST ACCEPTABLE     Drawn by RT     Sample type ARTERIAL      Ct Abdomen Pelvis Wo Contrast  08/27/2013   CLINICAL DATA:  abd pain, hypotension,  EXAM: CT ABDOMEN AND PELVIS WITHOUT CONTRAST  TECHNIQUE: Multidetector CT imaging of the abdomen and pelvis was performed following the standard protocol without intravenous contrast.  COMPARISON:  SP REPLC GASTRO/COLONIC TUBE PERCUT W/FLUORO dated 02/10/2013  FINDINGS: Minimal atelectasis versus scarring within the lung bases.  Severe portal venous gas is appreciated within the liver with greatest confluence in the left lobe. Areas of pneumatosis are identified within the ascending colon, transverse colon and in regions of the sigmoid colon. Air is appreciated within the inferior mesenteric vein and portions of the splenic vein. Areas of pneumoperitoneum scattered throughout the abdomen.  The colon is air filled and distended.  No abdominal free fluid or loculated fluid collections are appreciated. No abdominal masses nor adenopathy. There is no evidence of abdominal aortic aneurysm.  Ventriculoperitoneal shunt is appreciated curled within the pelvis. A percutaneous gastric feeding tube is appreciated within a decompressed stomach. A small hiatal hernia appreciated.  No aggressive osseous appearing lesions.   IMPRESSION: 1. 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 2. Large amount of portal venous gas with greatest confluence in the right lobe of liver. 3. Critical Value/emergent results were called by telephone at the time of interpretation on 08/27/2013 at 6:00 PM to Dr. Carmin Muskrat , who verbally acknowledged these results.  4. These findings are consistent with bowel necrosis infectious versus ischemic. 5. Areas of pneumoperitoneum.   Electronically Signed   By: Margaree Mackintosh M.D.   On: 08/27/2013 18:08   Ct Head Wo Contrast  08/27/2013   CLINICAL DATA:  Altered mental status.  Episode of unresponsiveness.  EXAM: CT HEAD WITHOUT CONTRAST  TECHNIQUE: Contiguous axial images were obtained from the base of the skull through the vertex without contrast.  COMPARISON:  CT head 07/23/2013 most recent. Also 09/21/2010. The oldest scan is dated 10/16/2007.  FINDINGS: Ventriculoperitoneal shunt from a right frontal approach remains unchanged lying with its tip in the medial thalamus/leftward third ventricle. No worsening or developing hydrocephalus. Moderate sized remote lacunar infarcts affect the right basal ganglia. Generalized cortical atrophy. Remote right frontal cortical infarct. No acute hemorrhage, extra-axial fluid collection, or mass lesion. No visible acute stroke. Shunt tubing appears continuous. No extracranial fluid accumulation. Calvarium otherwise intact. Nasal cannula.  IMPRESSION: Stable ventriculomegaly, likely atrophy. Unremarkable appearing ventriculoperitoneal shunt tubing. Chronic ischemic change. No acute intracranial abnormality.   Electronically Signed   By: Rolla Flatten M.D.   On: 08/27/2013 17:52   Dg Chest Port 1 View  08/27/2013   CLINICAL DATA:  Loss of consciousness  EXAM: PORTABLE CHEST - 1 VIEW  COMPARISON:  July 26, 2013  FINDINGS: There is a shunt catheter extending along the right hemithorax. There is no edema or consolidation. Heart size and pulmonary  vascularity are normal. No adenopathy. No pneumothorax. No bone lesions.  IMPRESSION: No edema or consolidation. Shunt catheter extending along the right hemithorax.   Electronically Signed   By: Lowella Grip M.D.   On: 08/27/2013 17:32   Dg Abd Portable 1v  08/27/2013   CLINICAL DATA:  Shunt series.  Altered mental status.  EXAM: PORTABLE ABDOMEN - 1 VIEW  COMPARISON:  CT the abdomen 08/27/2013  FINDINGS: Shunt is seen entering abdomen from the right chest, coursing into the pelvis and ending in the  left mid abdomen. Shunt is intact.  Gastrostomy tube projects over the midline. Nonobstructive bowel gas. Mottled gas in the upper abdomen, both right and left noted. On prior CT, there is extensive pneumatosis and portal venous gas. Free air seen on prior CT cannot be appreciated by plain film.  IMPRESSION: Shunt catheter is intact.  Mild gas in the upper abdomen compatible with extensive pneumatosis and portal venous gas as seen on prior CT.   Electronically Signed   By: Rolm Baptise M.D.   On: 08/27/2013 20:03    ROS Blood pressure 113/77, pulse 89, temperature 96.8 F (36 C), temperature source Rectal, resp. rate 27, height 5' 8.9" (1.75 m), weight 50 kg (110 lb 3.7 oz), SpO2 94.00%. Physical Exam  Constitutional:  Contracted and contorted, cachectic  HENT:  Head: Atraumatic.  Eyes: Pupils are equal, round, and reactive to light.  Neck: Neck supple.  Cardiovascular: Normal rate and regular rhythm.   GI: Bowel sounds are normal. He exhibits distension (mild). He exhibits no mass. There is tenderness (mild to moderate). There is no rebound and no guarding.  Genitourinary: Penis normal.    Assessment/Plan: I have reviewed this patient's CT scan with the radiologist, and If he has as much dead bowel as outlined by the amount of the pneumatosis, this would require radical operation, open abdomen, eventually some type of ostomy, but his VP shunt would be exposed and eventually have to be  removed.  The aunt agreed that an operation this significant should not be done.  Patient is still a full code though.  We will follow.  Gwenyth Ober 08/27/2013, 8:06 PM

## 2013-08-27 NOTE — ED Notes (Addendum)
Finished with cleaning pt. Pt moved from Trauma room to D33. vanc infusing.

## 2013-08-27 NOTE — ED Notes (Signed)
Per EMS, patient was at a normal doctor visit with Dr. Modena JanskyKohler. After the visit, patient went limp in the wheelchair and became unresponsive. Upon arrival of the EMS, patient had shallow, spontaneous breathing and was unresponsive. Patient was not responsive to pain. Vitals per EMS: 220 CBG, 40 Audible Pressure, No palpable pulse. Patient's baseline is some alter mental status.

## 2013-08-27 NOTE — ED Notes (Addendum)
[facility]. maxipime given. vanc infusing.

## 2013-08-27 NOTE — ED Notes (Signed)
i-stat CG4+ result 3.6398mmol/L given to Dr. Jeraldine LootsLockwood

## 2013-08-27 NOTE — ED Notes (Signed)
Attempted report to 2600 (2C)

## 2013-08-28 ENCOUNTER — Encounter (HOSPITAL_COMMUNITY): Payer: Self-pay

## 2013-08-28 DIAGNOSIS — E872 Acidosis, unspecified: Secondary | ICD-10-CM

## 2013-08-28 DIAGNOSIS — J690 Pneumonitis due to inhalation of food and vomit: Secondary | ICD-10-CM

## 2013-08-28 DIAGNOSIS — A419 Sepsis, unspecified organism: Principal | ICD-10-CM

## 2013-08-28 DIAGNOSIS — K55059 Acute (reversible) ischemia of intestine, part and extent unspecified: Secondary | ICD-10-CM

## 2013-08-28 DIAGNOSIS — E039 Hypothyroidism, unspecified: Secondary | ICD-10-CM

## 2013-08-28 LAB — COMPREHENSIVE METABOLIC PANEL
ALT: 54 U/L — ABNORMAL HIGH (ref 0–53)
AST: 62 U/L — ABNORMAL HIGH (ref 0–37)
Albumin: 2.4 g/dL — ABNORMAL LOW (ref 3.5–5.2)
Alkaline Phosphatase: 99 U/L (ref 39–117)
BUN: 19 mg/dL (ref 6–23)
CALCIUM: 8.5 mg/dL (ref 8.4–10.5)
CO2: 12 mEq/L — ABNORMAL LOW (ref 19–32)
Chloride: 112 mEq/L (ref 96–112)
Creatinine, Ser: 1.07 mg/dL (ref 0.50–1.35)
GFR calc Af Amer: 87 mL/min — ABNORMAL LOW (ref >90)
GFR calc non Af Amer: 75 mL/min — ABNORMAL LOW (ref >90)
Glucose, Bld: 132 mg/dL — ABNORMAL HIGH (ref 70–99)
Potassium: 4.2 mEq/L (ref 3.7–5.3)
Sodium: 142 mEq/L (ref 137–147)
Total Bilirubin: 0.4 mg/dL (ref 0.3–1.2)
Total Protein: 5.8 g/dL — ABNORMAL LOW (ref 6.0–8.3)

## 2013-08-28 LAB — CBC
HEMATOCRIT: 45.6 % (ref 39.0–52.0)
Hemoglobin: 15.3 g/dL (ref 13.0–17.0)
MCH: 30.2 pg (ref 26.0–34.0)
MCHC: 33.6 g/dL (ref 30.0–36.0)
MCV: 89.9 fL (ref 78.0–100.0)
Platelets: 284 10*3/uL (ref 150–400)
RBC: 5.07 MIL/uL (ref 4.22–5.81)
RDW: 14.2 % (ref 11.5–15.5)
WBC: 5.6 10*3/uL (ref 4.0–10.5)

## 2013-08-28 LAB — PHOSPHORUS: PHOSPHORUS: 1.1 mg/dL — AB (ref 2.3–4.6)

## 2013-08-28 LAB — CLOSTRIDIUM DIFFICILE BY PCR: Toxigenic C. Difficile by PCR: POSITIVE — AB

## 2013-08-28 LAB — MRSA PCR SCREENING: MRSA BY PCR: NEGATIVE

## 2013-08-28 LAB — MAGNESIUM: Magnesium: 2 mg/dL (ref 1.5–2.5)

## 2013-08-28 MED ORDER — MORPHINE SULFATE 2 MG/ML IJ SOLN
2.0000 mg | INTRAMUSCULAR | Status: DC
  Administered 2013-08-28 – 2013-09-01 (×22): 2 mg via INTRAVENOUS
  Filled 2013-08-28 (×24): qty 1

## 2013-08-28 MED ORDER — MORPHINE SULFATE 2 MG/ML IJ SOLN: 1.0000 mg | INTRAMUSCULAR | Status: DC | PRN

## 2013-08-28 MED ORDER — ATROPINE SULFATE 1 % OP SOLN
4.0000 [drp] | OPHTHALMIC | Status: DC | PRN
  Administered 2013-08-28 – 2013-08-30 (×2): 4 [drp] via SUBLINGUAL
  Filled 2013-08-28: qty 2

## 2013-08-28 MED ORDER — MORPHINE SULFATE 2 MG/ML IJ SOLN
1.0000 mg | INTRAMUSCULAR | Status: DC | PRN
  Administered 2013-08-28 – 2013-08-31 (×3): 2 mg via INTRAVENOUS
  Administered 2013-09-01: 1 mg via INTRAVENOUS
  Filled 2013-08-28 (×3): qty 1

## 2013-08-28 MED ORDER — LORAZEPAM 2 MG/ML IJ SOLN
0.5000 mg | INTRAMUSCULAR | Status: DC | PRN
  Administered 2013-08-28 – 2013-08-30 (×3): 0.5 mg via INTRAVENOUS
  Filled 2013-08-28 (×3): qty 1

## 2013-08-28 MED ORDER — MORPHINE SULFATE 2 MG/ML IJ SOLN
1.0000 mg | INTRAMUSCULAR | Status: DC | PRN
  Administered 2013-08-28: 1 mg via INTRAVENOUS
  Filled 2013-08-28: qty 1

## 2013-08-28 MED ORDER — LORAZEPAM 2 MG/ML IJ SOLN: 0.5000 mg | INTRAMUSCULAR | Status: DC | PRN

## 2013-08-28 MED ORDER — MORPHINE SULFATE 2 MG/ML IJ SOLN: 1.0000 mg | Freq: Once | INTRAMUSCULAR | Status: DC

## 2013-08-28 MED ORDER — SODIUM CHLORIDE 0.9 % IV BOLUS (SEPSIS)
1000.0000 mL | Freq: Once | INTRAVENOUS | Status: AC
  Administered 2013-08-28: 1000 mL via INTRAVENOUS

## 2013-08-28 MED ORDER — SCOPOLAMINE 1 MG/3DAYS TD PT72
1.0000 | MEDICATED_PATCH | TRANSDERMAL | Status: DC
  Administered 2013-08-28 – 2013-08-31 (×2): 1.5 mg via TRANSDERMAL
  Filled 2013-08-28 (×2): qty 1

## 2013-08-28 NOTE — Progress Notes (Signed)
Subjective: Patient arousable, shallow breathing States that he has no pain, but reacts slightly to palpation of the abdomen  Objective: Vital signs in last 24 hours: Temp:  [93.2 F (34 C)-96.8 F (36 C)] 96.2 F (35.7 C) (04/18 0428) Pulse Rate:  [87-118] 118 (04/18 0428) Resp:  [18-51] 51 (04/18 0800) BP: (96-119)/(72-87) 108/72 mmHg (04/18 0800) SpO2:  [88 %-100 %] 92 % (04/18 0428) FiO2 (%):  [40 %] 40 % (04/18 0000) Weight:  [110 lb 3.7 oz (50 kg)-117 lb 1 oz (53.1 kg)] 113 lb 1.5 oz (51.3 kg) (04/18 0500) Last BM Date: 08/27/13  Intake/Output from previous day: 04/17 0701 - 04/18 0700 In: 6035.4 [I.V.:5535.4; IV Piggyback:500] Out: -  Intake/Output this shift:    General appearance: no distress; eyes open but only intermittently follows commands Resp: rales bilaterally Cardio: tachy, regular rhythm GI: mildly distended; moderately tender to palpation PEG tube - clamped; site OK  Lab Results:   Recent Labs  08/27/13 1719 08/28/13 0540  WBC 6.4 5.6  HGB 11.1* 15.3  HCT 33.9* 45.6  PLT 213 284   BMET  Recent Labs  08/27/13 1719 08/28/13 0540  NA 140 142  K 3.0* 4.2  CL 107 112  CO2 15* 12*  GLUCOSE 200* 132*  BUN 18 19  CREATININE 1.17 1.07  CALCIUM 8.7 8.5   PT/INR No results found for this basename: LABPROT, INR,  in the last 72 hours ABG  Recent Labs  08/27/13 1906  PHART 7.343*  HCO3 16.1*    Studies/Results: Ct Abdomen Pelvis Wo Contrast  08/27/2013   CLINICAL DATA:  abd pain, hypotension,  EXAM: CT ABDOMEN AND PELVIS WITHOUT CONTRAST  TECHNIQUE: Multidetector CT imaging of the abdomen and pelvis was performed following the standard protocol without intravenous contrast.  COMPARISON:  SP REPLC GASTRO/COLONIC TUBE PERCUT W/FLUORO dated 02/10/2013  FINDINGS: Minimal atelectasis versus scarring within the lung bases.  Severe portal venous gas is appreciated within the liver with greatest confluence in the left lobe. Areas of pneumatosis  are identified within the ascending colon, transverse colon and in regions of the sigmoid colon. Air is appreciated within the inferior mesenteric vein and portions of the splenic vein. Areas of pneumoperitoneum scattered throughout the abdomen.  The colon is air filled and distended.  No abdominal free fluid or loculated fluid collections are appreciated. No abdominal masses nor adenopathy. There is no evidence of abdominal aortic aneurysm.  Ventriculoperitoneal shunt is appreciated curled within the pelvis. A percutaneous gastric feeding tube is appreciated within a decompressed stomach. A small hiatal hernia appreciated.  No aggressive osseous appearing lesions.  IMPRESSION: 1. 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 2. Large amount of portal venous gas with greatest confluence in the right lobe of liver. 3. Critical Value/emergent results were called by telephone at the time of interpretation on 08/27/2013 at 6:00 PM to Dr. [name] , who verbally acknowledged these results.  4. These findings are consistent with bowel necrosis infectious versus ischemic. 5. Areas of pneumoperitoneum.   Electronically Signed   By: Salome HolmesHector  Cooper M.D.   On: 08/27/2013 18:08   Dg Skull 1-3 Views  08/27/2013   CLINICAL DATA:  Ventriculoperitoneal shunt  EXAM: SKULL - 1-3 VIEW  COMPARISON:  CT obtained earlier in the day  FINDINGS: Frontal and lateral views were obtained. The shunt tip is near the midline somewhat posterior in position. CT earlier in the day show the tip just to the left of the dilated third  ventricle. The shunt tubing appears patent in the skull and in the visualized cervical spine regions. No bony lesions are identified.  IMPRESSION: Shunt catheter appears grossly intact in visualized regions.   Electronically Signed   By: Bretta BangWilliam  Woodruff M.D.   On: 08/27/2013 20:14   Ct Head Wo Contrast  08/27/2013   CLINICAL DATA:  Altered mental status.  Episode of unresponsiveness.  EXAM:  CT HEAD WITHOUT CONTRAST  TECHNIQUE: Contiguous axial images were obtained from the base of the skull through the vertex without contrast.  COMPARISON:  CT head 07/23/2013 most recent. Also 09/21/2010. The oldest scan is dated 10/16/2007.  FINDINGS: Ventriculoperitoneal shunt from a right frontal approach remains unchanged lying with its tip in the medial thalamus/leftward third ventricle. No worsening or developing hydrocephalus. Moderate sized remote lacunar infarcts affect the right basal ganglia. Generalized cortical atrophy. Remote right frontal cortical infarct. No acute hemorrhage, extra-axial fluid collection, or mass lesion. No visible acute stroke. Shunt tubing appears continuous. No extracranial fluid accumulation. Calvarium otherwise intact. Nasal cannula.  IMPRESSION: Stable ventriculomegaly, likely atrophy. Unremarkable appearing ventriculoperitoneal shunt tubing. Chronic ischemic change. No acute intracranial abnormality.   Electronically Signed   By: Davonna BellingJohn  Curnes M.D.   On: 08/27/2013 17:52   Dg Chest Port 1 View  08/27/2013   CLINICAL DATA:  Loss of consciousness  EXAM: PORTABLE CHEST - 1 VIEW  COMPARISON:  July 26, 2013  FINDINGS: There is a shunt catheter extending along the right hemithorax. There is no edema or consolidation. Heart size and pulmonary vascularity are normal. No adenopathy. No pneumothorax. No bone lesions.  IMPRESSION: No edema or consolidation. Shunt catheter extending along the right hemithorax.   Electronically Signed   By: Bretta BangWilliam  Woodruff M.D.   On: 08/27/2013 17:32   Dg Abd Portable 1v  08/27/2013   CLINICAL DATA:  Shunt series.  Altered mental status.  EXAM: PORTABLE ABDOMEN - 1 VIEW  COMPARISON:  CT the abdomen 08/27/2013  FINDINGS: Shunt is seen entering abdomen from the right chest, coursing into the pelvis and ending in the left mid abdomen. Shunt is intact.  Gastrostomy tube projects over the midline. Nonobstructive bowel gas. Mottled gas in the upper abdomen,  both right and left noted. On prior CT, there is extensive pneumatosis and portal venous gas. Free air seen on prior CT cannot be appreciated by plain film.  IMPRESSION: Shunt catheter is intact.  Mild gas in the upper abdomen compatible with extensive pneumatosis and portal venous gas as seen on prior CT.   Electronically Signed   By: Charlett NoseKevin  Dover M.D.   On: 08/27/2013 20:03    Anti-infectives: Anti-infectives   Start     Dose/Rate Route Frequency Ordered Stop   08/28/13 1800  vancomycin (VANCOCIN) IVPB 1000 mg/200 mL premix     1,000 mg 200 mL/hr over 60 Minutes Intravenous Every 24 hours 08/27/13 1817     08/28/13 1800  ceFEPIme (MAXIPIME) 1 g in dextrose 5 % 50 mL IVPB     1 g 100 mL/hr over 30 Minutes Intravenous Every 24 hours 08/27/13 1817     08/27/13 2300  metroNIDAZOLE (FLAGYL) IVPB 500 mg     500 mg 100 mL/hr over 60 Minutes Intravenous Every 6 hours 08/27/13 2233     08/27/13 1815  vancomycin (VANCOCIN) IVPB 1000 mg/200 mL premix     1,000 mg 200 mL/hr over 60 Minutes Intravenous  Once 08/27/13 1800 08/27/13 2015   08/27/13 1815  ceFEPIme (MAXIPIME) 2 g in  dextrose 5 % 50 mL IVPB     2 g 100 mL/hr over 30 Minutes Intravenous  Once 08/27/13 1802 08/27/13 1920      Assessment/Plan: s/p * No surgery found * Extensive bowel ischemia involving the entire colon with extensive portal venous gas The decision has been made after discussion with his aunt to NOT pursue aggressive surgical treatment.  Palliative measures per primary team.   LOS: 1 day    Wilmon Arms. Evangelyn Crouse 08/28/2013

## 2013-08-28 NOTE — Progress Notes (Signed)
Received patient from [facility]. DNR, comfort care only, enteric precautions, positive c-diff. Aunt at bedside.

## 2013-08-28 NOTE — ED Provider Notes (Signed)
This patient was seen in conjunction with the resident physician.  The documentation accurately reflects the patient's encounter and evaluation here in the emergency department.  I have seen relevant labs and studies.  On my exam, patient was presenting in extremis, with tachypnea, hypoxia, altered mental status. The patient has multiple medical issues, including prior TB I., feeding tube dependency, recurrent infections. Immediately on arrival patient received IV fluid resuscitation, supplemental oxygen. Patient's evaluation is resubmitted lactic, and with CT evidence of pneumatosis and pneumoperitoneum we discussed his case with our surgical team, as well as critical care. Patient received empiric antibiotics after we discussed this with pharmacy to 2 allergies, and he was admitted to the ICU for further evaluation and management.  Pulse ox 89% supplemental O2- abnormal  Cardiac: 75, sr, nml  I have seen the ecg, agree with the interpretation.  CRITICAL CARE Performed by: Gerhard Munchobert Shelma Eiben Total critical care time: 50 Critical care time was exclusive of separately billable procedures and treating other patients. Critical care was necessary to treat or prevent imminent or life-threatening deterioration. Critical care was time spent personally by me on the following activities: development of treatment plan with patient and/or surrogate as well as nursing, discussions with consultants, evaluation of patient's response to treatment, examination of patient, obtaining history from patient or surrogate, ordering and performing treatments and interventions, ordering and review of laboratory studies, ordering and review of radiographic studies, pulse oximetry and re-evaluation of patient's condition.    Gerhard Munchobert Aideliz Garmany, MD 08/28/13 [phone]

## 2013-08-28 NOTE — Progress Notes (Signed)
Nutrition Brief Note  Patient identified on the Malnutrition Screening Tool (MST) Report  Wt Readings from Last 15 Encounters:  08/28/13 113 lb 1.5 oz (51.3 kg)  07/27/13 110 lb 3.7 oz (50 kg)  07/22/13 110 lb 8 oz (50.122 kg)  04/16/12 124 lb 1.9 oz (56.3 kg)    Body mass index is 18.26 kg/(m^2). Patient meets criteria for Underweight based on current BMI.   Per chart review pt with poor prognosis and is now full comfort care. Family has decided they do not want PEG feeds to continue at this time.   No nutrition interventions warranted at this time. If nutrition issues arise, please consult RD.   Ian Malkineanne Barnett RD, LDN Inpatient Clinical Dietitian Pager: [phone] After Hours Pager: [phone]

## 2013-08-28 NOTE — Progress Notes (Addendum)
Internal Medicine Teaching Service Interval Progress Note  Goals of care discussion Update  I had a long conversation with Roy JacobsonHelen, [name]'s HCPOA and aunt who he lives with and her son [name] in regards to [name]'s current state of health and poor prognosis.  They are both very realistic and involved in his care and are clear that they want him to be FULL COMFORT CARE and as comfortable as possible.  They would like to discontinue antibiotics and IVF and agree with pain medicine to help him more comfortable. We will also transition off the venti mask which is very uncomfortable for [name] to a [place].  They do not want peg tube feeds to continue at this time. No IVF. He is to remain DNR/DNI. This discussion took place in the room with [name] listening as well and then I also confirmed the discussion with him directly. He nodded when asked questions appropriately and said yes he wants to be comfortable and also gave me a thumbs up. He does not want the venti mask and is asking for pain medicine at time for his abdominal pain.  The family would like to proceed with hospice placement if possible, likely residential and not home hospice. I then discussed the case with [name] on the telephone who was very kind and also helped provide some recommendation for pain management. We will start with scheduled morphine 2mg  every 4 hours and morphine 1mg  every 2 hours prn. If this does not control pain or assist him with his breathing which is bothersome to him, we can transition to morphine gtt starting at 1mg /hr with bolus if needed.  We will also write for low dose ativan as needed and scopolamine patch along with atropine 1% sublingual drops every 4 hours as needed. I have also discussed the case with [name] from [facility] who is aware of the situation and is also in touch with the family.  She will try to come by later today to meet with the family and patient as well and will also work with our social work  department for placement if he is able to make it to that time. Official palliative consult was offered to the family as well who declined saying they are comfortable with our discussion and our treatment plan and would just like to proceed as is at this time.  I have updated my attending, [name] and RN caring for him as well at this time.  We will place transfer to a more palliative catered floor if any and can offer comfort cart to the family as well.  The goal is to now make both patient and family as comfortable as possible. Our team is available at all times to address any needs or concerns.   -d/c all antibiotics and IVF -d/c venti mask, transition to [place] O2 -keep IV in place for morphine both schedule and prn; currently on morphine 2mg  q4h and morphine 1mg  q1h prn for break through.  If this is not sufficient to help with his tachypnea or pain, will transition to morphine gtt -oral care and suction as needed for patient comfort -discussed with [name], [facility], family, patient, and RN -consulted social work for hospice placement -transfer to floor when bed available  Signed: Darden PalmerSamaya Nafisa Olds, MD PGY-2, Internal Medicine Resident Pager: [phone]  08/28/2013,12:04 PM

## 2013-08-28 NOTE — Progress Notes (Addendum)
Subjective: Mr. [name] was seen and examined at bedside this morning. He is awake, non-verbal, but responds to commands. On NRB. Denies any pain but is breathing fast.    Objective: Vital signs in last 24 hours: Filed Vitals:   08/28/13 0000 08/28/13 0018 08/28/13 0428 08/28/13 0500  BP: 99/78 99/78 101/85   Pulse: 94 101 118   Temp: 93.2 F (34 C)  96.2 F (35.7 C)   TempSrc: Rectal  Axillary   Resp: 45 45 31   Height:      Weight:    113 lb 1.5 oz (51.3 kg)  SpO2:   92%    Weight change:   Intake/Output Summary (Last 24 hours) at 08/28/13 0757 Last data filed at 08/28/13 0700  Gross per 24 hour  Intake 6035.42 ml  Output      0 ml  Net 6035.42 ml   Vitals reviewed. General: resting in bed, on NRB, thin HEENT: EOMI Cardiac: Tachycardia Pulm: coarse b/l breath sounds, tachypnea Abd: soft, mild tenderness to palpation diffusely, BS absent Ext: moving all 4 extremities Neuro: alert, awake, responding to commands, generalized weakness  Lab Results: Basic Metabolic Panel:  Recent Labs Lab 08/27/13 1719 08/28/13 0540  NA 140 142  K 3.0* 4.2  CL 107 112  CO2 15* 12*  GLUCOSE 200* 132*  BUN 18 19  CREATININE 1.17 1.07  CALCIUM 8.7 8.5  MG  --  2.0  PHOS  --  1.1*   Liver Function Tests:  Recent Labs Lab 08/27/13 1719 08/28/13 0540  AST 72* 62*  ALT 58* 54*  ALKPHOS 111 99  BILITOT 0.3 0.4  PROT 5.9* 5.8*  ALBUMIN 2.9* 2.4*    Recent Labs Lab 08/27/13 1719  LIPASE 29   CBC:  Recent Labs Lab 08/27/13 1719 08/28/13 0540  WBC 6.4 5.6  NEUTROABS 4.1  --   HGB 11.1* 15.3  HCT 33.9* 45.6  MCV 89.9 89.9  PLT 213 284   Cardiac Enzymes:  Recent Labs Lab 08/27/13 1719  TROPONINI <0.30   Urine Drug Screen: Drugs of Abuse     Component Value Date/Time   LABOPIA NONE DETECTED 07/21/2013 0547   COCAINSCRNUR NONE DETECTED 07/21/2013 0547   LABBENZ NONE DETECTED 07/21/2013 0547   AMPHETMU NONE DETECTED 07/21/2013 0547   THCU NONE DETECTED  07/21/2013 0547   LABBARB NONE DETECTED 07/21/2013 0547     Micro Results: Recent Results (from the past 240 hour(s))  MRSA PCR SCREENING     Status: None   Collection Time    08/27/13 10:31 PM      Result Value Ref Range Status   MRSA by PCR NEGATIVE  NEGATIVE Final   Comment:            The GeneXpert MRSA Assay (FDA     approved for NASAL specimens     only), is one component of a     comprehensive MRSA colonization     surveillance program. It is not     intended to diagnose MRSA     infection nor to guide or     monitor treatment for     MRSA infections.   Studies/Results: Ct Abdomen Pelvis Wo Contrast  08/27/2013   CLINICAL DATA:  abd pain, hypotension,  EXAM: CT ABDOMEN AND PELVIS WITHOUT CONTRAST  TECHNIQUE: Multidetector CT imaging of the abdomen and pelvis was performed following the standard protocol without intravenous contrast.  COMPARISON:  SP REPLC GASTRO/COLONIC TUBE PERCUT W/FLUORO dated 02/10/2013  FINDINGS: Minimal atelectasis versus scarring within the lung bases.  Severe portal venous gas is appreciated within the liver with greatest confluence in the left lobe. Areas of pneumatosis are identified within the ascending colon, transverse colon and in regions of the sigmoid colon. Air is appreciated within the inferior mesenteric vein and portions of the splenic vein. Areas of pneumoperitoneum scattered throughout the abdomen.  The colon is air filled and distended.  No abdominal free fluid or loculated fluid collections are appreciated. No abdominal masses nor adenopathy. There is no evidence of abdominal aortic aneurysm.  Ventriculoperitoneal shunt is appreciated curled within the pelvis. A percutaneous gastric feeding tube is appreciated within a decompressed stomach. A small hiatal hernia appreciated.  No aggressive osseous appearing lesions.  IMPRESSION: 1. 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 2. Large amount of portal venous gas with greatest  confluence in the right lobe of liver. 3. Critical Value/emergent results were called by telephone at the time of interpretation on 08/27/2013 at 6:00 PM to Dr. [name] , who verbally acknowledged these results.  4. These findings are consistent with bowel necrosis infectious versus ischemic. 5. Areas of pneumoperitoneum.   Electronically Signed   By: Salome HolmesHector  Cooper M.D.   On: 08/27/2013 18:08   Dg Skull 1-3 Views  08/27/2013   CLINICAL DATA:  Ventriculoperitoneal shunt  EXAM: SKULL - 1-3 VIEW  COMPARISON:  CT obtained earlier in the day  FINDINGS: Frontal and lateral views were obtained. The shunt tip is near the midline somewhat posterior in position. CT earlier in the day show the tip just to the left of the dilated third ventricle. The shunt tubing appears patent in the skull and in the visualized cervical spine regions. No bony lesions are identified.  IMPRESSION: Shunt catheter appears grossly intact in visualized regions.   Electronically Signed   By: Bretta BangWilliam  Woodruff M.D.   On: 08/27/2013 20:14   Ct Head Wo Contrast  08/27/2013   CLINICAL DATA:  Altered mental status.  Episode of unresponsiveness.  EXAM: CT HEAD WITHOUT CONTRAST  TECHNIQUE: Contiguous axial images were obtained from the base of the skull through the vertex without contrast.  COMPARISON:  CT head 07/23/2013 most recent. Also 09/21/2010. The oldest scan is dated 10/16/2007.  FINDINGS: Ventriculoperitoneal shunt from a right frontal approach remains unchanged lying with its tip in the medial thalamus/leftward third ventricle. No worsening or developing hydrocephalus. Moderate sized remote lacunar infarcts affect the right basal ganglia. Generalized cortical atrophy. Remote right frontal cortical infarct. No acute hemorrhage, extra-axial fluid collection, or mass lesion. No visible acute stroke. Shunt tubing appears continuous. No extracranial fluid accumulation. Calvarium otherwise intact. Nasal cannula.  IMPRESSION: Stable  ventriculomegaly, likely atrophy. Unremarkable appearing ventriculoperitoneal shunt tubing. Chronic ischemic change. No acute intracranial abnormality.   Electronically Signed   By: Davonna BellingJohn  Curnes M.D.   On: 08/27/2013 17:52   Dg Chest Port 1 View  08/27/2013   CLINICAL DATA:  Loss of consciousness  EXAM: PORTABLE CHEST - 1 VIEW  COMPARISON:  July 26, 2013  FINDINGS: There is a shunt catheter extending along the right hemithorax. There is no edema or consolidation. Heart size and pulmonary vascularity are normal. No adenopathy. No pneumothorax. No bone lesions.  IMPRESSION: No edema or consolidation. Shunt catheter extending along the right hemithorax.   Electronically Signed   By: Bretta BangWilliam  Woodruff M.D.   On: 08/27/2013 17:32   Dg Abd Portable 1v  08/27/2013   CLINICAL DATA:  Shunt series.  Altered mental status.  EXAM: PORTABLE ABDOMEN - 1 VIEW  COMPARISON:  CT the abdomen 08/27/2013  FINDINGS: Shunt is seen entering abdomen from the right chest, coursing into the pelvis and ending in the left mid abdomen. Shunt is intact.  Gastrostomy tube projects over the midline. Nonobstructive bowel gas. Mottled gas in the upper abdomen, both right and left noted. On prior CT, there is extensive pneumatosis and portal venous gas. Free air seen on prior CT cannot be appreciated by plain film.  IMPRESSION: Shunt catheter is intact.  Mild gas in the upper abdomen compatible with extensive pneumatosis and portal venous gas as seen on prior CT.   Electronically Signed   By: Charlett Nose M.D.   On: 08/27/2013 20:03   Medications: I have reviewed the patient's current medications. Scheduled Meds: . ceFEPime (MAXIPIME) IV  1 g Intravenous Q24H  . enoxaparin (LOVENOX) injection  40 mg Subcutaneous Q24H  . levothyroxine  37.5 mcg Intravenous Daily  . metronidazole  500 mg Intravenous Q6H  . sodium chloride  1,000 mL Intravenous Once  . vancomycin  1,000 mg Intravenous Q24H   Continuous Infusions: . sodium chloride  1,000 mL (08/27/13 2036)  . sodium chloride Stopped (08/27/13 2037)   PRN Meds:.sodium chloride, morphine injection Assessment/Plan: Principal Problem:   Extensive Bowel Necrosis Active Problems:   Hypothyroidism   VP (ventriculoperitoneal) shunt status   Dysphagia due to old stroke   Hypothermia   History of necrotic bowel   Increased anion gap metabolic acidosis   Hypokalemia [name] is a 58 year old gentleman with PMH of intracranial bleed s/p VP shunt, peg tube dependent, hypothyroidism, and CVA admitted for syncope and abdominal pain with CT confirmed severe bowel pneumatosis and large amount of portal venous gas. Initially admitted by PCCM and transferred to our service last night.   Severe sepsis 2/2 necrotic bowel (infectious vs. Ischemic)--very guarded prognosis.   Lactic acid up to 4 last night. Remains hypotensive and tachycardic this morning with tachypnea.  Evaluated by Dr. Lindie Spruce from surgery yesterday and deemed not a candidate for surgical intervention due to need for radical operation with ostomy and removal of VP shunt, as confirmed by aunt (HCPOA).  ~S/p NS 5L since admission.  -remains in step down -I spoke with [name] (aunt and HCPOA) again this morning and explained his current condition, vital signs, and poor prognosis.  She continues with DNR/DNI status and also recommends making Mr. Holian comfortable at this time and is trying to arrange transportation to come to the hospital to see him as soon as possible.  She would like to continue current management at this time with antibiotics and fluid and will likely discuss further care goals upon arrival or sooner if needed.  -IV NS 1L bolus again this morning, try to keep map >65 -surgery following -I/O monitoring -continue Cefepime, vancomycin, Flagyl  -C. Difficile PCR pending  -confirm further goals of care with aunt hopefully early this morning, may need palliative consult assistance but may not be enough time.  She  would prefer comfort care measures but would like to see first.  -DNR/DNI, confirmed as per Dr. Aline August note and by myself this morning on the hone when speaking to [facility], patient's aunt who is HCPOA; will continue to contact aunt if change in clinical status  -pain control as needed, currently patient is complaining of no pain.   AG metabolic acidosis with lactic acidosis- AG 18 with bicarb down to 12 this morning, lactic acid  4 likely due to necrotic bowel.  -trend lactic acid  -CMP, Mg, Phos, CBC AM  -phos 1.1, will replace  Hypothermia--resolved. Likely secondary to sepsis. Interestingly, patient had hypothermia on 3/16 admission; TSH and AM cortisol normal at that time.  -warming blanket applied   Chronic aspiration PNA- Patient is s/p PEG placement, NPO. On admission, CXR negative.  -NPO for now pending goals of care -supplemental O2, currently on NRB  Hypothyroidism-On Synthroid 37.5 mcg daily at home, will continue while inpatient. TSH 1.974 07/2013  Dispo: extremely guarded prognosis. Further goals of care discussion pending.   The patient does have a current PCP [name], MD) and does need an [facility] hospital follow-up appointment after discharge.  The patient does not have transportation limitations that hinder transportation to clinic appointments.  Services Needed at time of discharge: Y = Yes, Blank = No PT:   OT:   RN:   Equipment:   Other:     LOS: 1 day   Darden Palmer, MD 08/28/2013, 7:57 AM

## 2013-08-28 NOTE — Progress Notes (Signed)
  Date: 08/28/2013  Patient name: Roy FredricksonLeonard R [name]  Medical record number: [id number]  Date of birth: 11-29-1955   This patient has been seen and the plan of care was discussed with the house staff. Please see their note for complete details. I concur with their findings with the following additions/corrections: Pt is awake, not complaining of pain. Follows commands. On a non-rebreather, significantly tachypneic and using accessory muscles. On exam, I hear no bowel sounds. There is minimal abdominal tenderness. He has evidence of severe bowel wall pneumatosis involving nearly the entire colon. Prognosis is poor. He was seen by critical care and surgery and is not a surgical candidate at this time. He has evidence of severe sepsis. Continue aggressive IVF resuscitation. Continue Vanc/Cefepime/Flagyl for now. Further discussion with POA needed. Given his clinical presentation and status, he will likely not survive this hospitalization.  Jonah BlueAlejandro Jayron Maqueda, DO, FACP Faculty [facility] 08/28/2013, 10:05 AM

## 2013-08-29 NOTE — Progress Notes (Signed)
Prognosis is very poor.  Acknowledge that the patient has been made DNR/DNI and full comfort measures.  No labs, peg tube, or Ventimask.  Appreciate Internal medicine teaching service and Dr. Ladona Ridgelaylor for palliatives care of the patient.  No surgery indicated/desired at this time.  Will sign off.  Would be available for questions/concerns.

## 2013-08-29 NOTE — Progress Notes (Signed)
Discussed with PA and agree with plan  Darsh Vandevoort M. Emmanuela Ghazi, MD, FACS General, Bariatric, & Minimally Invasive Surgery [facility]  

## 2013-08-29 NOTE — Progress Notes (Addendum)
Subjective: Patient seen at bedside this AM. Patient appears comfortable on [place], mildly tachypneic, responsive to verbal stimuli, able to answer some yes or no questions. Denies pain at this time. Tachycardic on exam.  Objective: Vital signs in last 24 hours: Filed Vitals:   08/28/13 1100 08/28/13 1200 08/28/13 1300 08/29/13 0550  BP: 91/62 96/53 78/50  94/60  Pulse: 45 51 52 125  Temp:  98.2 F (36.8 C)  98.1 F (36.7 C)  TempSrc:  Axillary  Axillary  Resp: 47 52 52 28  Height:      Weight:      SpO2: 41% 37% 31% 84%   Weight change:   Intake/Output Summary (Last 24 hours) at 08/29/13 0735 Last data filed at 08/29/13 0555  Gross per 24 hour  Intake   1000 ml  Output    375 ml  Net    625 ml   Vitals reviewed. General: Resting in bed, on O2 via [place]. NAD. Responsive to verbal stimuli. HEENT: EOMI Cardiac: Tachycardia Pulm: Coarse b/l breath sounds, tachypnea Abd: Soft, mild tenderness to palpation diffusely, BS absent Ext: moving all 4 extremities Neuro: Somnolent, arousable to verbal stimulation.   Lab Results: Basic Metabolic Panel:  Recent Labs Lab 08/27/13 1719 08/28/13 0540  NA 140 142  K 3.0* 4.2  CL 107 112  CO2 15* 12*  GLUCOSE 200* 132*  BUN 18 19  CREATININE 1.17 1.07  CALCIUM 8.7 8.5  MG  --  2.0  PHOS  --  1.1*   Liver Function Tests:  Recent Labs Lab 08/27/13 1719 08/28/13 0540  AST 72* 62*  ALT 58* 54*  ALKPHOS 111 99  BILITOT 0.3 0.4  PROT 5.9* 5.8*  ALBUMIN 2.9* 2.4*    Recent Labs Lab 08/27/13 1719  LIPASE 29   CBC:  Recent Labs Lab 08/27/13 1719 08/28/13 0540  WBC 6.4 5.6  NEUTROABS 4.1  --   HGB 11.1* 15.3  HCT 33.9* 45.6  MCV 89.9 89.9  PLT 213 284   Cardiac Enzymes:  Recent Labs Lab 08/27/13 1719  TROPONINI <0.30   Urine Drug Screen: Drugs of Abuse     Component Value Date/Time   LABOPIA NONE DETECTED 07/21/2013 0547   COCAINSCRNUR NONE DETECTED 07/21/2013 0547   LABBENZ NONE DETECTED 07/21/2013 0547     AMPHETMU NONE DETECTED 07/21/2013 0547   THCU NONE DETECTED 07/21/2013 0547   LABBARB NONE DETECTED 07/21/2013 0547     Micro Results: Recent Results (from the past 240 hour(s))  MRSA PCR SCREENING     Status: None   Collection Time    08/27/13 10:31 PM      Result Value Ref Range Status   MRSA by PCR NEGATIVE  NEGATIVE Final   Comment:            The GeneXpert MRSA Assay (FDA     approved for NASAL specimens     only), is one component of a     comprehensive MRSA colonization     surveillance program. It is not     intended to diagnose MRSA     infection nor to guide or     monitor treatment for     MRSA infections.  CLOSTRIDIUM DIFFICILE BY PCR     Status: Abnormal   Collection Time    08/28/13 12:28 AM      Result Value Ref Range Status   C difficile by pcr POSITIVE (*) NEGATIVE Final   Comment: CRITICAL RESULT CALLED TO,  READ BACK BY AND VERIFIED WITH:     D.MUHURO,RN 1114 08/28/13 M.CAMPBELL   Studies/Results: Ct Abdomen Pelvis Wo Contrast  08/27/2013   CLINICAL DATA:  abd pain, hypotension,  EXAM: CT ABDOMEN AND PELVIS WITHOUT CONTRAST  TECHNIQUE: Multidetector CT imaging of the abdomen and pelvis was performed following the standard protocol without intravenous contrast.  COMPARISON:  SP REPLC GASTRO/COLONIC TUBE PERCUT W/FLUORO dated 02/10/2013  FINDINGS: Minimal atelectasis versus scarring within the lung bases.  Severe portal venous gas is appreciated within the liver with greatest confluence in the left lobe. Areas of pneumatosis are identified within the ascending colon, transverse colon and in regions of the sigmoid colon. Air is appreciated within the inferior mesenteric vein and portions of the splenic vein. Areas of pneumoperitoneum scattered throughout the abdomen.  The colon is air filled and distended.  No abdominal free fluid or loculated fluid collections are appreciated. No abdominal masses nor adenopathy. There is no evidence of abdominal aortic aneurysm.   Ventriculoperitoneal shunt is appreciated curled within the pelvis. A percutaneous gastric feeding tube is appreciated within a decompressed stomach. A small hiatal hernia appreciated.  No aggressive osseous appearing lesions.  IMPRESSION: 1. 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 2. Large amount of portal venous gas with greatest confluence in the right lobe of liver. 3. Critical Value/emergent results were called by telephone at the time of interpretation on 08/27/2013 at 6:00 PM to Dr. [name] , who verbally acknowledged these results.  4. These findings are consistent with bowel necrosis infectious versus ischemic. 5. Areas of pneumoperitoneum.   Electronically Signed   By: Salome HolmesHector  Cooper M.D.   On: 08/27/2013 18:08   Dg Skull 1-3 Views  08/27/2013   CLINICAL DATA:  Ventriculoperitoneal shunt  EXAM: SKULL - 1-3 VIEW  COMPARISON:  CT obtained earlier in the day  FINDINGS: Frontal and lateral views were obtained. The shunt tip is near the midline somewhat posterior in position. CT earlier in the day show the tip just to the left of the dilated third ventricle. The shunt tubing appears patent in the skull and in the visualized cervical spine regions. No bony lesions are identified.  IMPRESSION: Shunt catheter appears grossly intact in visualized regions.   Electronically Signed   By: Bretta BangWilliam  Woodruff M.D.   On: 08/27/2013 20:14   Ct Head Wo Contrast  08/27/2013   CLINICAL DATA:  Altered mental status.  Episode of unresponsiveness.  EXAM: CT HEAD WITHOUT CONTRAST  TECHNIQUE: Contiguous axial images were obtained from the base of the skull through the vertex without contrast.  COMPARISON:  CT head 07/23/2013 most recent. Also 09/21/2010. The oldest scan is dated 10/16/2007.  FINDINGS: Ventriculoperitoneal shunt from a right frontal approach remains unchanged lying with its tip in the medial thalamus/leftward third ventricle. No worsening or developing hydrocephalus. Moderate  sized remote lacunar infarcts affect the right basal ganglia. Generalized cortical atrophy. Remote right frontal cortical infarct. No acute hemorrhage, extra-axial fluid collection, or mass lesion. No visible acute stroke. Shunt tubing appears continuous. No extracranial fluid accumulation. Calvarium otherwise intact. Nasal cannula.  IMPRESSION: Stable ventriculomegaly, likely atrophy. Unremarkable appearing ventriculoperitoneal shunt tubing. Chronic ischemic change. No acute intracranial abnormality.   Electronically Signed   By: Davonna BellingJohn  Curnes M.D.   On: 08/27/2013 17:52   Dg Chest Port 1 View  08/27/2013   CLINICAL DATA:  Loss of consciousness  EXAM: PORTABLE CHEST - 1 VIEW  COMPARISON:  July 26, 2013  FINDINGS: There is a  shunt catheter extending along the right hemithorax. There is no edema or consolidation. Heart size and pulmonary vascularity are normal. No adenopathy. No pneumothorax. No bone lesions.  IMPRESSION: No edema or consolidation. Shunt catheter extending along the right hemithorax.   Electronically Signed   By: [name] M.D.   On: 08/27/2013 17:32   Dg Abd Portable 1v  08/27/2013   CLINICAL DATA:  Shunt series.  Altered mental status.  EXAM: PORTABLE ABDOMEN - 1 VIEW  COMPARISON:  CT the abdomen 08/27/2013  FINDINGS: Shunt is seen entering abdomen from the right chest, coursing into the pelvis and ending in the left mid abdomen. Shunt is intact.  Gastrostomy tube projects over the midline. Nonobstructive bowel gas. Mottled gas in the upper abdomen, both right and left noted. On prior CT, there is extensive pneumatosis and portal venous gas. Free air seen on prior CT cannot be appreciated by plain film.  IMPRESSION: Shunt catheter is intact.  Mild gas in the upper abdomen compatible with extensive pneumatosis and portal venous gas as seen on prior CT.   Electronically Signed   By: Charlett Nose M.D.   On: 08/27/2013 20:03   Medications: I have reviewed the patient's current  medications. Scheduled Meds: .  morphine injection  1 mg Intravenous Once  .  morphine injection  2 mg Intravenous Q4H  . scopolamine  1 patch Transdermal Q72H   Continuous Infusions:   PRN Meds:.sodium chloride, atropine, LORazepam, morphine injection  Assessment/Plan: [name] is a 58 y.o. male w/ PMHx of intracranial bleed s/p VP shunt, PEG dependent, hypothyroidism, and CVA admitted for syncope and abdominal pain. CT confirmed severe bowel pneumatosis and large amount of portal venous gas. Initially admitted by PCCM, transferred to IMTS on 08/27/13, now comfort care.  Severe sepsis 2/2 necrotic bowel w/ C. Diff Colitis- Patient remains slightly hypotensive, tachypneic and tachycardic. Patient now full comfort care. Patient appears comfortable in bed, responsive to verbal stimuli, denies pain.  -For possible discharge to hospice 08/30/13. -Continue O2 via [place] -Morphine 1-2 mg q1h for pain/dyspnea. May change to Morphine gtt if not adequate for comfort. -Scopolamine patch -Ativan 0.5 mg q4h prn. -NO IVF resuscitation.  -Atropine drops for secretions  Hypothermia- Resolved. Likely secondary to sepsis. -Warming blanket applied   Dispo: Patient comfort care as of 08/28/13. For possible discharge to hospice when able.   The patient does have a current PCP [name], MD) and does need an [facility] hospital follow-up appointment after discharge.  The patient does not have transportation limitations that hinder transportation to clinic appointments.  Services Needed at time of discharge: Y = Yes, Blank = No PT:   OT:   RN:   Equipment:   Other:     LOS: 2 days   [name], MD 08/29/2013, 7:35 AM

## 2013-08-30 DIAGNOSIS — A0472 Enterocolitis due to Clostridium difficile, not specified as recurrent: Secondary | ICD-10-CM

## 2013-08-30 NOTE — Clinical Social Work Note (Signed)
CSW informed during 6N morning progression with medical team, pt's discharge disposition is residential hospice. CSW also informed during 6N morning progression with medical team that pt's aunt Winnifred Friar([name]) is pt's next of kin for decision making.  CSW spoke with Myriam JacobsonHelen regarding pt's discharge disposition. Per Myriam JacobsonHelen, she would prefer for [facility] of discharge as Myriam JacobsonHelen [place] "[place]" [facility]. CSW recommended Myriam JacobsonHelen consider a "back-up" choice for residential hospice placement, however, Myriam JacobsonHelen uninterested in other placement at this time.   CSW consulted [facility] admissions liaison regarding Helen's preference for pt's discharge disposition. [facility] admissions liaison to assess pt and speak with [facility]. [facility] admissions liaison to update CSW once more information available.  CSW to continue to follow and assist with discharge planning needs.  Darlyn ChamberEmily [place], LCSWA Clinical Social Worker [phone]

## 2013-08-30 NOTE — Clinical Social Work Note (Signed)
[facility] involvement with [facility]. CSW consulted with pt's [facility] CSW Altha Harm([place]) regarding pt's discharge disposition of residential hospice. [place] to consult with [facility] team and inform CSW of steps required in residential hospice placement.   CSW to continue to follow and assist with discharge planning needs.  Darlyn ChamberEmily [place], LCSWA Clinical Social Worker [phone]

## 2013-08-30 NOTE — Progress Notes (Signed)
  Date: 08/30/2013  Patient name: [name]  Medical record number: [id number]  Date of birth: 1955/08/21   This patient has been seen and the plan of care was discussed with the house staff. Please see their note for complete details. I concur with their findings with the following additions/corrections: Appears comfortable. Opens eyes to verbal and tactile stimuli.  Given extensive necrosis of bowel and Cdiff infection, poor prognosis. Goals of care discussion with family noted. No aggressive measures and family has requested comfort care only. Agree with hospice.  Jonah BlueAlejandro [place], DO, FACP Faculty [facility] 08/30/2013, 12:29 PM

## 2013-08-30 NOTE — Consult Note (Signed)
[facility] Liaison: Received request from CSW Irving Burtonmily for family interest in [facility]. Chart reviewed and spoke with aunt by phone. Aunt reports patient is [facility] patient and RN Macon LargeMaryAnne is coordinating his care. Made CSW aware. Await [facility] input. Thank you. Forrestine Himva Val Farnam LCSW [phone]

## 2013-08-30 NOTE — Progress Notes (Signed)
Subjective: Patient seen at bedside this AM. Patient appears comfortable lying in bed. Slightly tachypneic on room air. Arousable to verbal stimuli. Not answering questions today.   Objective: Vital signs in last 24 hours: Filed Vitals:   08/28/13 1200 08/28/13 1300 08/29/13 0550 08/30/13 0621  BP: 96/53 78/50 94/60  115/73  Pulse: 51 52 125 117  Temp: 98.2 F (36.8 C)  98.1 F (36.7 C) 97.9 F (36.6 C)  TempSrc: Axillary  Axillary Axillary  Resp: 52 52 28 24  Height:      Weight:      SpO2: 37% 31% 84% 81%   Weight change:   Intake/Output Summary (Last 24 hours) at 08/30/13 1143 Last data filed at 08/30/13 [id number]  Gross per 24 hour  Intake      0 ml  Output    850 ml  Net   -850 ml   Vitals reviewed. General: Resting in bed, on RA. NAD. Responsive to verbal stimuli. HEENT: EOMI. Cardiac: Tachycardia. Pulm: Coarse b/l breath sounds, tachypnea. Abd: Did not palpate.  Ext: Moving all 4 extremities Neuro: Somnolent, arousable to verbal stimulation.   Lab Results: Basic Metabolic Panel:  Recent Labs Lab 08/27/13 1719 08/28/13 0540  NA 140 142  K 3.0* 4.2  CL 107 112  CO2 15* 12*  GLUCOSE 200* 132*  BUN 18 19  CREATININE 1.17 1.07  CALCIUM 8.7 8.5  MG  --  2.0  PHOS  --  1.1*   Liver Function Tests:  Recent Labs Lab 08/27/13 1719 08/28/13 0540  AST 72* 62*  ALT 58* 54*  ALKPHOS 111 99  BILITOT 0.3 0.4  PROT 5.9* 5.8*  ALBUMIN 2.9* 2.4*    Recent Labs Lab 08/27/13 1719  LIPASE 29   CBC:  Recent Labs Lab 08/27/13 1719 08/28/13 0540  WBC 6.4 5.6  NEUTROABS 4.1  --   HGB 11.1* 15.3  HCT 33.9* 45.6  MCV 89.9 89.9  PLT 213 284   Cardiac Enzymes:  Recent Labs Lab 08/27/13 1719  TROPONINI <0.30   Urine Drug Screen: Drugs of Abuse     Component Value Date/Time   LABOPIA NONE DETECTED 07/21/2013 0547   COCAINSCRNUR NONE DETECTED 07/21/2013 0547   LABBENZ NONE DETECTED 07/21/2013 0547   AMPHETMU NONE DETECTED 07/21/2013 0547   THCU  NONE DETECTED 07/21/2013 0547   LABBARB NONE DETECTED 07/21/2013 0547     Micro Results: Recent Results (from the past 240 hour(s))  CULTURE, BLOOD (ROUTINE X 2)     Status: None   Collection Time    08/27/13  6:35 PM      Result Value Ref Range Status   Specimen Description BLOOD ARM RIGHT   Final   Special Requests BOTTLES DRAWN AEROBIC AND ANAEROBIC 5CCBLUE 3CCRED   Final   Culture  Setup Time     Final   Value: 08/28/2013 01:53     Performed at [facility]   Culture     Final   Value:        BLOOD CULTURE RECEIVED NO GROWTH TO DATE CULTURE WILL BE HELD FOR 5 DAYS BEFORE ISSUING A FINAL NEGATIVE REPORT     Performed at [facility]   Report Status PENDING   Incomplete  CULTURE, BLOOD (ROUTINE X 2)     Status: None   Collection Time    08/27/13  6:40 PM      Result Value Ref Range Status   Specimen Description BLOOD ARM LEFT  Final   Special Requests BOTTLES DRAWN AEROBIC ONLY 4CC   Final   Culture  Setup Time     Final   Value: 08/28/2013 01:53     Performed at [facility]   Culture     Final   Value:        BLOOD CULTURE RECEIVED NO GROWTH TO DATE CULTURE WILL BE HELD FOR 5 DAYS BEFORE ISSUING A FINAL NEGATIVE REPORT     Performed at [facility]   Report Status PENDING   Incomplete  MRSA PCR SCREENING     Status: None   Collection Time    08/27/13 10:31 PM      Result Value Ref Range Status   MRSA by PCR NEGATIVE  NEGATIVE Final   Comment:            The GeneXpert MRSA Assay (FDA     approved for NASAL specimens     only), is one component of a     comprehensive MRSA colonization     surveillance program. It is not     intended to diagnose MRSA     infection nor to guide or     monitor treatment for     MRSA infections.  CLOSTRIDIUM DIFFICILE BY PCR     Status: Abnormal   Collection Time    08/28/13 12:28 AM      Result Value Ref Range Status   C difficile by pcr POSITIVE (*) NEGATIVE Final   Comment: CRITICAL RESULT CALLED TO,  READ BACK BY AND VERIFIED WITH:     D.MUHURO,RN 1114 08/28/13 M.CAMPBELL   Medications: I have reviewed the patient's current medications. Scheduled Meds: .  morphine injection  1 mg Intravenous Once  .  morphine injection  2 mg Intravenous Q4H  . scopolamine  1 patch Transdermal Q72H   Continuous Infusions:   PRN Meds:.sodium chloride, atropine, LORazepam, morphine injection  Assessment/Plan: [name] is a 58 y.o. male w/ PMHx of intracranial bleed s/p VP shunt, PEG dependent, hypothyroidism, and CVA admitted for syncope and abdominal pain. CT confirmed severe bowel pneumatosis and large amount of portal venous gas. Initially admitted by PCCM, transferred to IMTS on 08/27/13. Comfort care.  Severe sepsis 2/2 necrotic bowel w/ C. Diff Colitis- Patient remains tachypneic and tachycardic. Now full comfort care. Patient appears comfortable in bed, responsive to verbal stimuli, denies pain.  -For possible discharge to hospice 08/30/13. -Continue O2 via [place] if patient appears uncomfortable and hypoxic on RA. -Morphine 1-2 mg q1h for pain/dyspnea. May change to Morphine gtt if not adequate for comfort. -Continue Scopolamine patch -Continue Ativan 0.5 mg q4h prn. -NO IVF resuscitation.  -Atropine drops for secretions  Hypothermia- Resolved. Likely secondary to sepsis. -Warming blanket applied   Dispo: Patient comfort care as of 08/28/13. For possible discharge to hospice when able.   The patient does have a current PCP [name](Roy N Koehler, MD) and [facility].  The patient does not have transportation limitations that hinder transportation to clinic appointments.  Services Needed at time of discharge: Y = Yes, Blank = No PT:   OT:   RN:   Equipment:   Other:     LOS: 3 days   Courtney ParisEden W Emmette Katt, MD 08/30/2013, 11:43 AM

## 2013-08-31 NOTE — Progress Notes (Signed)
  Date: 08/31/2013  Patient name: Acie FredricksonLeonard R Nicholl  Medical record number: [id number]  Date of birth: Jun 10, 1955   This patient has been seen and the plan of care was discussed with the house staff. Please see their note for complete details. I concur with their findings with the following additions/corrections: Appears comfortable in bed. Comfort care. Discharge planned for [facility].  Jonah BlueAlejandro Kobie Whidby, DO, FACP Faculty [facility] 08/31/2013, 12:21 PM

## 2013-08-31 NOTE — Clinical Social Work Note (Signed)
CSW received call from [facility] stating bed [facility] on 09/[facility]/2013. [facility] is currently active with [facility]; therefore[facility] currently awaiting "official" insurance (verbal already given) from [facility] prior to [facility]. CSW to continue to follow and assist with [facility].  Darlyn ChamberEmily [place], LCSWA Clinical Social Worker [phone]

## 2013-08-31 NOTE — Discharge Summary (Signed)
Name: Roy FredricksonLeonard R Stuck MRN: [id number] DOB: 07/19/1955 58 y.o. PCP: Jethro Bastosobert N Koehler, MD  Date of Admission: 08/27/2013  5:09 PM Date of Discharge: 09/01/2013 Attending Physician: [name], DO  Discharge Diagnosis: 1. Bowel Necrosis 2. C. Difficile Colitis  Discharge Medications:   Medication List    STOP taking these medications       acetaminophen 325 MG tablet  Commonly known as:  TYLENOL     feeding supplement (OSMOLITE 1.2 CAL) Liqd     feeding supplement (PRO-STAT SUGAR FREE 64) Liqd     free water Soln     levothyroxine 75 MCG tablet  Commonly known as:  SYNTHROID, LEVOTHROID     multivitamin Liqd     sulfamethoxazole-trimethoprim 200-40 MG/5ML suspension  Commonly known as:  BACTRIM,SEPTRA        Disposition and follow-up:   Mr.[name] [facility]. At the hospital follow up visit please address:  1.  Patient is comfort care; Patient's pain and dyspnea are main concern. Patient comfortable on Morphine 2 mg q4h + 1-2 q1 prn for severe pain or dyspnea. This had been adequate, however, patient may benefit from Morphine gtt.  2.  Labs / imaging needed at time of follow-up: none  3.  Pending labs/ test needing follow-up: none  Follow-up Appointments:   Discharge Instructions:   Consultations:  PCCM  Procedures Performed:  Ct Abdomen Pelvis Wo Contrast  08/27/2013   CLINICAL DATA:  abd pain, hypotension,  EXAM: CT ABDOMEN AND PELVIS WITHOUT CONTRAST  TECHNIQUE: Multidetector CT imaging of the abdomen and pelvis was performed following the standard protocol without intravenous contrast.  COMPARISON:  SP REPLC GASTRO/COLONIC TUBE PERCUT W/FLUORO dated 02/10/2013  FINDINGS: Minimal atelectasis versus scarring within the lung bases.  Severe portal venous gas is appreciated within the liver with greatest confluence in the left lobe. Areas of pneumatosis are identified within the  ascending colon, transverse colon and in regions of the sigmoid colon. Air is appreciated within the inferior mesenteric vein and portions of the splenic vein. Areas of pneumoperitoneum scattered throughout the abdomen.  The colon is air filled and distended.  No abdominal free fluid or loculated fluid collections are appreciated. No abdominal masses nor adenopathy. There is no evidence of abdominal aortic aneurysm.  Ventriculoperitoneal shunt is appreciated curled within the pelvis. A percutaneous gastric feeding tube is appreciated within a decompressed stomach. A small hiatal hernia appreciated.  No aggressive osseous appearing lesions.  IMPRESSION: 1. 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 2. Large amount of portal venous gas with greatest confluence in the right lobe of liver. 3. Critical Value/emergent results were called by telephone at the time of interpretation on 08/27/2013 at 6:00 PM to Dr. [name] , who verbally acknowledged these results.  4. These findings are consistent with bowel necrosis infectious versus ischemic. 5. Areas of pneumoperitoneum.   Electronically Signed   By: Salome HolmesHector  Cooper M.D.   On: 08/27/2013 18:08   Dg Skull 1-3 Views  08/27/2013   CLINICAL DATA:  Ventriculoperitoneal shunt  EXAM: SKULL - 1-3 VIEW  COMPARISON:  CT obtained earlier in the day  FINDINGS: Frontal and lateral views were obtained. The shunt tip is near the midline somewhat posterior in position. CT earlier in the day show the tip just to the left of the dilated third ventricle. The shunt tubing appears patent in the skull and in the visualized cervical  spine regions. No bony lesions are identified.  IMPRESSION: Shunt catheter appears grossly intact in visualized regions.   Electronically Signed   By: [name] M.D.   On: 08/27/2013 20:14   Ct Head Wo Contrast  08/27/2013   CLINICAL DATA:  Altered mental status.  Episode of unresponsiveness.  EXAM: CT HEAD WITHOUT CONTRAST   TECHNIQUE: Contiguous axial images were obtained from the base of the skull through the vertex without contrast.  COMPARISON:  CT head 07/23/2013 most recent. Also 09/21/2010. The oldest scan is dated 10/16/2007.  FINDINGS: Ventriculoperitoneal shunt from a right frontal approach remains unchanged lying with its tip in the medial thalamus/leftward third ventricle. No worsening or developing hydrocephalus. Moderate sized remote lacunar infarcts affect the right basal ganglia. Generalized cortical atrophy. Remote right frontal cortical infarct. No acute hemorrhage, extra-axial fluid collection, or mass lesion. No visible acute stroke. Shunt tubing appears continuous. No extracranial fluid accumulation. Calvarium otherwise intact. Nasal cannula.  IMPRESSION: Stable ventriculomegaly, likely atrophy. Unremarkable appearing ventriculoperitoneal shunt tubing. Chronic ischemic change. No acute intracranial abnormality.   Electronically Signed   By: [name] M.D.   On: 08/27/2013 17:52   Dg Chest Port 1 View  08/27/2013   CLINICAL DATA:  Loss of consciousness  EXAM: PORTABLE CHEST - 1 VIEW  COMPARISON:  July 26, 2013  FINDINGS: There is a shunt catheter extending along the right hemithorax. There is no edema or consolidation. Heart size and pulmonary vascularity are normal. No adenopathy. No pneumothorax. No bone lesions.  IMPRESSION: No edema or consolidation. Shunt catheter extending along the right hemithorax.   Electronically Signed   By: [name] M.D.   On: 08/27/2013 17:32   Dg Abd Portable 1v  08/27/2013   CLINICAL DATA:  Shunt series.  Altered mental status.  EXAM: PORTABLE ABDOMEN - 1 VIEW  COMPARISON:  CT the abdomen 08/27/2013  FINDINGS: Shunt is seen entering abdomen from the right chest, coursing into the pelvis and ending in the left mid abdomen. Shunt is intact.  Gastrostomy tube projects over the midline. Nonobstructive bowel gas. Mottled gas in the upper abdomen, both right and left  noted. On prior CT, there is extensive pneumatosis and portal venous gas. Free air seen on prior CT cannot be appreciated by plain film.  IMPRESSION: Shunt catheter is intact.  Mild gas in the upper abdomen compatible with extensive pneumatosis and portal venous gas as seen on prior CT.   Electronically Signed   By: Charlett Nose M.D.   On: 08/27/2013 20:03   Admission HPI:  [name]. Durrell is a 57-yo M with pmh of intracranial bleed s/p VP shunt, peg tube, hypothyroidism, HL, CVA, and gout who presented with abdominal pain, episode of syncope and LOC at PCP office earlier today. Pt was reported to become limp, slumping over in his wheelchair, and unresponsive with shallow spontaneous breathing. Pt was found to be hypotensive that responsed to IV fluids with elevated lactic acid (3.7) and anion gap metabolic acidosis with ABG revealing pH 7.342, and bicarb 16. CT imaging revealed extensive necrotic small and large intestine with pneumatosis. Pt was seen by surgery who after discussion with pt's Aunt was deemed not a candidate for surgical intervention due to requirement for radical operation with ostomy and removal of VP shunt. In the ED pt was found to have improved blood pressure after fluid resuscitation (4 L NS), mild respiratory distress requiring oxygen supplementation, and unable to talk but able to respond with yes/no questions. Pt  reported having abdominal pain that began today with diarrhea for past 2-4 weeks with normal passage of gas and no distension, nausea, emesis, fever, or weight loss. PCCM consulted for further management of pt's condition. Pt's HCPOA (Aunt) was contacted via phone who confirmed pt's wishes to be DNR/DNI.    Hospital Course by problem list:   1. Bowel Necrosis- Patient admitted [facility] by PCCM who presented with abdominal pain and an episode of syncope with LOC at PCP's office and was found to be hypotensive and responsive to IV fluids. CT abdomen findings suggestive of  extensive necrotic small and large intestine with pneumatosis. Patient w/ such extensive necrotic disease, not a candidate for surgical intervention. Pt's HCPOA (Aunt) was contacted via phone who confirmed pt's wishes to be DNR/DNI. Started on Cefepime, Vancomycin, and Flagyl on admission. Patient also found to be hypothermic on admission, likely 2/2 sepsis, warming blanket applied w/ resolution of low temp. Patient transferred out of the ICU on 08/27/13. On 08/28/13, HCPOA agreed to full comfort care, IVF's, ABx, and all medical interventions withdrawn. Patient given O2 via [place] and Morphine 2 mg q4h scheduled + 1-2 q1h prn for pain and dyspnea. Patient kept comfortable during this period, did not appear to be in pain on examination. Also given Scopolamine patch, Ativan 0.5 mg q4h prn, and Atropine drops for secretions.  2. C. Difficile Colitis- on 08/29/13, patient also found to be positive for C. Difficile in addition to severe bowel necrosis. However, given comfort care measures, did not administer antibiotics.   Discharge Vitals:   BP 88/53  Pulse 106  Temp(Src) 98.4 F (36.9 C) (Axillary)  Resp 20  Ht 5\' 6"  (1.676 m)  Wt 113 lb 1.5 oz (51.3 kg)  BMI 18.26 kg/m2  SpO2 92%  Discharge Labs:  No results found for this or any previous visit (from the past 24 hour(s)).  Signed: Courtney ParisEden W Rylann Munford, MD 09/01/2013, 9:34 AM   Time Spent on Discharge: 35 minutes Services Ordered on Discharge: none Equipment Ordered on Discharge: none

## 2013-08-31 NOTE — Consult Note (Signed)
[facility] Liaison: [facility] for Mr. Alles tomorrow if transfer still makes sense. Plan to meet with Barbara CowerAunt Helen at [facility] this afternoon to complete paperwork. Please fax discharge summary to [phone] and have RN call report to [phone]. Please arrange transport for Mr. Butt to arrive before noon. CSW aware. Thank you. Forrestine Himva Joelle [place] LCSW [phone]

## 2013-08-31 NOTE — Progress Notes (Signed)
Subjective: Patient seen at bedside this AM. Patient w/ eyes open, in no distress, slightly tachypneic on exam. No change in physical exam findings.   Objective: Vital signs in last 24 hours: Filed Vitals:   08/29/13 0550 08/30/13 0621 08/30/13 1300 08/31/13 0538  BP: 94/60 115/73 126/75 86/60  Pulse: 125 117 71 112  Temp: 98.1 F (36.7 C) 97.9 F (36.6 C) 97.2 F (36.2 C) 98 F (36.7 C)  TempSrc: Axillary Axillary Axillary Axillary  Resp: 28 24 22 22   Height:      Weight:      SpO2: 84% 81% 82% 88%   Weight change:   Intake/Output Summary (Last 24 hours) at 08/31/13 0736 Last data filed at 08/31/13 0539  Gross per 24 hour  Intake      0 ml  Output    400 ml  Net   -400 ml   Vitals reviewed. General: Resting in bed, on [place]. NAD. Responsive to verbal stimuli. HEENT: EOMI. Cardiac: Tachycardia. Pulm: Coarse b/l breath sounds, tachypnea. Abd: Did not palpate.  Ext: Moving all 4 extremities Neuro: Awake, eyes open. Not answering questions.  Lab Results: Basic Metabolic Panel:  Recent Labs Lab 08/27/13 1719 08/28/13 0540  NA 140 142  K 3.0* 4.2  CL 107 112  CO2 15* 12*  GLUCOSE 200* 132*  BUN 18 19  CREATININE 1.17 1.07  CALCIUM 8.7 8.5  MG  --  2.0  PHOS  --  1.1*   Liver Function Tests:  Recent Labs Lab 08/27/13 1719 08/28/13 0540  AST 72* 62*  ALT 58* 54*  ALKPHOS 111 99  BILITOT 0.3 0.4  PROT 5.9* 5.8*  ALBUMIN 2.9* 2.4*    Recent Labs Lab 08/27/13 1719  LIPASE 29   CBC:  Recent Labs Lab 08/27/13 1719 08/28/13 0540  WBC 6.4 5.6  NEUTROABS 4.1  --   HGB 11.1* 15.3  HCT 33.9* 45.6  MCV 89.9 89.9  PLT 213 284   Cardiac Enzymes:  Recent Labs Lab 08/27/13 1719  TROPONINI <0.30   Urine Drug Screen: Drugs of Abuse     Component Value Date/Time   LABOPIA NONE DETECTED 07/21/2013 0547   COCAINSCRNUR NONE DETECTED 07/21/2013 0547   LABBENZ NONE DETECTED 07/21/2013 0547   AMPHETMU NONE DETECTED 07/21/2013 0547   THCU NONE  DETECTED 07/21/2013 0547   LABBARB NONE DETECTED 07/21/2013 0547     Micro Results: Recent Results (from the past 240 hour(s))  CULTURE, BLOOD (ROUTINE X 2)     Status: None   Collection Time    08/27/13  6:35 PM      Result Value Ref Range Status   Specimen Description BLOOD ARM RIGHT   Final   Special Requests BOTTLES DRAWN AEROBIC AND ANAEROBIC 5CCBLUE 3CCRED   Final   Culture  Setup Time     Final   Value: 08/28/2013 01:53     Performed at [facility]   Culture     Final   Value:        BLOOD CULTURE RECEIVED NO GROWTH TO DATE CULTURE WILL BE HELD FOR 5 DAYS BEFORE ISSUING A FINAL NEGATIVE REPORT     Performed at [facility]   Report Status PENDING   Incomplete  CULTURE, BLOOD (ROUTINE X 2)     Status: None   Collection Time    08/27/13  6:40 PM      Result Value Ref Range Status   Specimen Description BLOOD ARM LEFT  Final   Special Requests BOTTLES DRAWN AEROBIC ONLY 4CC   Final   Culture  Setup Time     Final   Value: 08/28/2013 01:53     Performed at [facility]   Culture     Final   Value:        BLOOD CULTURE RECEIVED NO GROWTH TO DATE CULTURE WILL BE HELD FOR 5 DAYS BEFORE ISSUING A FINAL NEGATIVE REPORT     Performed at [facility]   Report Status PENDING   Incomplete  MRSA PCR SCREENING     Status: None   Collection Time    08/27/13 10:31 PM      Result Value Ref Range Status   MRSA by PCR NEGATIVE  NEGATIVE Final   Comment:            The GeneXpert MRSA Assay (FDA     approved for NASAL specimens     only), is one component of a     comprehensive MRSA colonization     surveillance program. It is not     intended to diagnose MRSA     infection nor to guide or     monitor treatment for     MRSA infections.  CLOSTRIDIUM DIFFICILE BY PCR     Status: Abnormal   Collection Time    08/28/13 12:28 AM      Result Value Ref Range Status   C difficile by pcr POSITIVE (*) NEGATIVE Final   Comment: CRITICAL RESULT CALLED TO, READ  BACK BY AND VERIFIED WITH:     D.MUHURO,RN 1114 08/28/13 M.CAMPBELL   Medications: I have reviewed the patient's current medications. Scheduled Meds: .  morphine injection  1 mg Intravenous Once  .  morphine injection  2 mg Intravenous Q4H  . scopolamine  1 patch Transdermal Q72H   Continuous Infusions:   PRN Meds:.sodium chloride, atropine, LORazepam, morphine injection  Assessment/Plan: [name] is a 58 y.o. male w/ PMHx of intracranial bleed s/p VP shunt, PEG dependent, hypothyroidism, and CVA admitted for syncope and abdominal pain. CT confirmed severe bowel pneumatosis and large amount of portal venous gas. Initially admitted by PCCM, transferred to IMTS on 08/27/13. Comfort care.  Severe sepsis 2/2 necrotic bowel w/ C. Diff Colitis- Patient remains tachypneic and tachycardic. Full comfort care. Patient appears generally comfortable in bed, eyes open, some concern for increased pain.  -For [facility] -Continue O2 via [place] if patient appears uncomfortable and hypoxic on RA. Morphine 2 mg q4h scheduled.  -Morphine 1-2 mg q1h for pain/dyspnea. May change to Morphine gtt if not adequate for comfort. -Continue Scopolamine patch -Continue Ativan 0.5 mg q4h prn. -NO IVF resuscitation.  -Atropine drops for secretions  Hypothermia- Resolved. Likely secondary to sepsis. -Warming blanket applied   Dispo: Patient comfort care as of 08/28/13. [facility].  The patient does have a current PCP [name](Robert N Koehler, MD) and [facility] after discharge.  The patient does not have transportation limitations that hinder transportation to clinic appointments.  Services Needed at time of discharge: Y = Yes, Blank = No PT:   OT:   RN:   Equipment:   Other:     LOS: 4 days   Courtney ParisEden W Sharene Krikorian, MD 08/31/2013, 7:36 AM

## 2013-09-01 MED ORDER — WHITE PETROLATUM GEL
Status: AC
  Administered 2013-09-01: 0.2
  Filled 2013-09-01: qty 5

## 2013-09-01 NOTE — Progress Notes (Signed)
Subjective: Patient seen at bedside this AM. [name] actually quite responsive this morning, says he was NOT in pain, and when asked if he was comfortable, he replied yes. When leaving room, patient said, "don't forget about me". Appears in no acute distress. Still breathing okay w/ O2 via [place].   Objective: Vital signs in last 24 hours: Filed Vitals:   08/31/13 0538 08/31/13 1419 08/31/13 2244 09/01/13 0552  BP: 86/60 86/49 90/47  88/53  Pulse: 112  100 106  Temp: 98 F (36.7 C) 99.4 F (37.4 C) 98.7 F (37.1 C) 98.4 F (36.9 C)  TempSrc: Axillary  Axillary Axillary  Resp: 22  20 20   Height:      Weight:      SpO2: 88% 99% 93% 92%   Weight change:   Intake/Output Summary (Last 24 hours) at 09/01/13 0725 Last data filed at 09/01/13 0600  Gross per 24 hour  Intake      0 ml  Output   2200 ml  Net  -2200 ml   Vitals reviewed. General: Resting in bed, on [place]. NAD. Awake, and alert this AM.  HEENT: EOMI. Cardiac: Tachycardia. Pulm: Coarse b/l breath sounds, tachypnea. Abd: Did not palpate. No BS Ext: Moving all 4 extremities Neuro: Awake, eyes open, able to answer yes or no questions this AM.   Lab Results: Basic Metabolic Panel:  Recent Labs Lab 08/27/13 1719 08/28/13 0540  NA 140 142  K 3.0* 4.2  CL 107 112  CO2 15* 12*  GLUCOSE 200* 132*  BUN 18 19  CREATININE 1.17 1.07  CALCIUM 8.7 8.5  MG  --  2.0  PHOS  --  1.1*   Liver Function Tests:  Recent Labs Lab 08/27/13 1719 08/28/13 0540  AST 72* 62*  ALT 58* 54*  ALKPHOS 111 99  BILITOT 0.3 0.4  PROT 5.9* 5.8*  ALBUMIN 2.9* 2.4*    Recent Labs Lab 08/27/13 1719  LIPASE 29   CBC:  Recent Labs Lab 08/27/13 1719 08/28/13 0540  WBC 6.4 5.6  NEUTROABS 4.1  --   HGB 11.1* 15.3  HCT 33.9* 45.6  MCV 89.9 89.9  PLT 213 284   Cardiac Enzymes:  Recent Labs Lab 08/27/13 1719  TROPONINI <0.30   Urine Drug Screen: Drugs of Abuse     Component Value Date/Time   LABOPIA NONE DETECTED  07/21/2013 0547   COCAINSCRNUR NONE DETECTED 07/21/2013 0547   LABBENZ NONE DETECTED 07/21/2013 0547   AMPHETMU NONE DETECTED 07/21/2013 0547   THCU NONE DETECTED 07/21/2013 0547   LABBARB NONE DETECTED 07/21/2013 0547     Micro Results: Recent Results (from the past 240 hour(s))  CULTURE, BLOOD (ROUTINE X 2)     Status: None   Collection Time    08/27/13  6:35 PM      Result Value Ref Range Status   Specimen Description BLOOD ARM RIGHT   Final   Special Requests BOTTLES DRAWN AEROBIC AND ANAEROBIC 5CCBLUE 3CCRED   Final   Culture  Setup Time     Final   Value: 08/28/2013 01:53     Performed at [facility]   Culture     Final   Value:        BLOOD CULTURE RECEIVED NO GROWTH TO DATE CULTURE WILL BE HELD FOR 5 DAYS BEFORE ISSUING A FINAL NEGATIVE REPORT     Performed at [facility]   Report Status PENDING   Incomplete  CULTURE, BLOOD (ROUTINE X 2)  Status: None   Collection Time    08/27/13  6:40 PM      Result Value Ref Range Status   Specimen Description BLOOD ARM LEFT   Final   Special Requests BOTTLES DRAWN AEROBIC ONLY 4CC   Final   Culture  Setup Time     Final   Value: 08/28/2013 01:53     Performed at [facility]   Culture     Final   Value:        BLOOD CULTURE RECEIVED NO GROWTH TO DATE CULTURE WILL BE HELD FOR 5 DAYS BEFORE ISSUING A FINAL NEGATIVE REPORT     Performed at [facility]   Report Status PENDING   Incomplete  MRSA PCR SCREENING     Status: None   Collection Time    08/27/13 10:31 PM      Result Value Ref Range Status   MRSA by PCR NEGATIVE  NEGATIVE Final   Comment:            The GeneXpert MRSA Assay (FDA     approved for NASAL specimens     only), is one component of a     comprehensive MRSA colonization     surveillance program. It is not     intended to diagnose MRSA     infection nor to guide or     monitor treatment for     MRSA infections.  CLOSTRIDIUM DIFFICILE BY PCR     Status: Abnormal   Collection  Time    08/28/13 12:28 AM      Result Value Ref Range Status   C difficile by pcr POSITIVE (*) NEGATIVE Final   Comment: CRITICAL RESULT CALLED TO, READ BACK BY AND VERIFIED WITH:     D.MUHURO,RN 1114 08/28/13 M.CAMPBELL   Medications: I have reviewed the patient's current medications. Scheduled Meds: .  morphine injection  1 mg Intravenous Once  .  morphine injection  2 mg Intravenous Q4H  . scopolamine  1 patch Transdermal Q72H   Continuous Infusions:   PRN Meds:.sodium chloride, atropine, LORazepam, morphine injection  Assessment/Plan: [name] is a 58 y.o. male w/ PMHx of intracranial bleed s/p VP shunt, PEG dependent, hypothyroidism, and CVA admitted for syncope and abdominal pain. CT confirmed severe bowel pneumatosis and large amount of portal venous gas. Initially admitted by PCCM, transferred to IMTS on 08/27/13. Comfort care.  Severe sepsis 2/2 necrotic bowel w/ C. Diff Colitis- Patient remains tachycardic. Full comfort care. Patient quite responsive this morning, says he is not in pain and claims he is very comfortable. Does not appear tachypneic this morning.  -[facility] -Continue O2 via [place] if patient appears uncomfortable and hypoxic on RA. -Morphine 2 mg q4h scheduled.  -Morphine 1-2 mg q1h for pain/dyspnea. May change to Morphine gtt if not adequate for comfort. -Continue Scopolamine patch -Continue Ativan 0.5 mg q4h prn. -NO IVF resuscitation.  -Atropine drops for secretions  Dispo: Patient comfort care as of 08/28/13. [facility]  The patient does have a current PCP [name](Robert N Koehler, MD) and [facility] after discharge.  The patient does not have transportation limitations that hinder transportation to clinic appointments.  Services Needed at time of discharge: Y = Yes, Blank = No PT:   OT:   RN:   Equipment:   Other:     LOS: 5 days   Courtney ParisEden W Kashay Cavenaugh, MD  09/01/2013,  7:25 AM

## 2013-09-01 NOTE — Progress Notes (Signed)
Roy FredricksonLeonard R [name] to be D/C'd [facility].  Discussed with the patient and all questions fully answered.    Medication List    STOP taking these medications       acetaminophen 325 MG tablet  Commonly known as:  TYLENOL     feeding supplement (OSMOLITE 1.2 CAL) Liqd     feeding supplement (PRO-STAT SUGAR FREE 64) Liqd     free water Soln     levothyroxine 75 MCG tablet  Commonly known as:  SYNTHROID, LEVOTHROID     multivitamin Liqd     sulfamethoxazole-trimethoprim 200-40 MG/5ML suspension  Commonly known as:  BACTRIM,SEPTRA        Patient transported via EMS. Social work packet taken.  Patient received morphine 2mg  prior to discharge and is in no acute distress. [facility].   [name] 09/01/2013 10:54 AM

## 2013-09-01 NOTE — Discharge Summary (Signed)
  Date: 09/01/2013  Patient name: [name]  Medical record number: [id number]  Date of birth: 30-Apr-1956   This patient has been seen and the plan of care was discussed with the house staff. Please see their note for complete details. I concur with their findings with the following additions/corrections: Appears comfortable. Family wants full comfort care. He has significant evidence of bowel necrosis and pneumatosis. D[facility]C [facility].  Jonah BlueAlejandro Jock Mahon, DO, FACP Faculty [facility] 09/01/2013, 2:40 PM

## 2013-09-01 NOTE — Clinical Social Work Note (Signed)
Per MD, MD has faxed discharge summary to [facility]. CSW has completed discharge packet and [facility]. CSW has updated pt's aunt, Ms. Norva Pavlovdgar, regarding pt's discharge. Pt's aunt agreeable to discharge. CSW has arranged for transportation via EMS ([facility]).  RN please call [facility] to [facility] at [phone]  Darlyn ChamberEmily [place], [place] Clinical Social Worker [phone]

## 2013-09-03 LAB — CULTURE, BLOOD (ROUTINE X 2)
CULTURE: NO GROWTH
Culture: NO GROWTH

## 2013-09-10 DEATH — deceased

## 2014-10-02 IMAGING — CR DG SKULL 1-3V
4 series · 4 of 4 positions shown · non-contrast
Comparison: CT obtained earlier in the day

CLINICAL DATA: Ventriculoperitoneal shunt

EXAM:
SKULL - 1-3 VIEW

[ap [person_name] (1 of 2)]
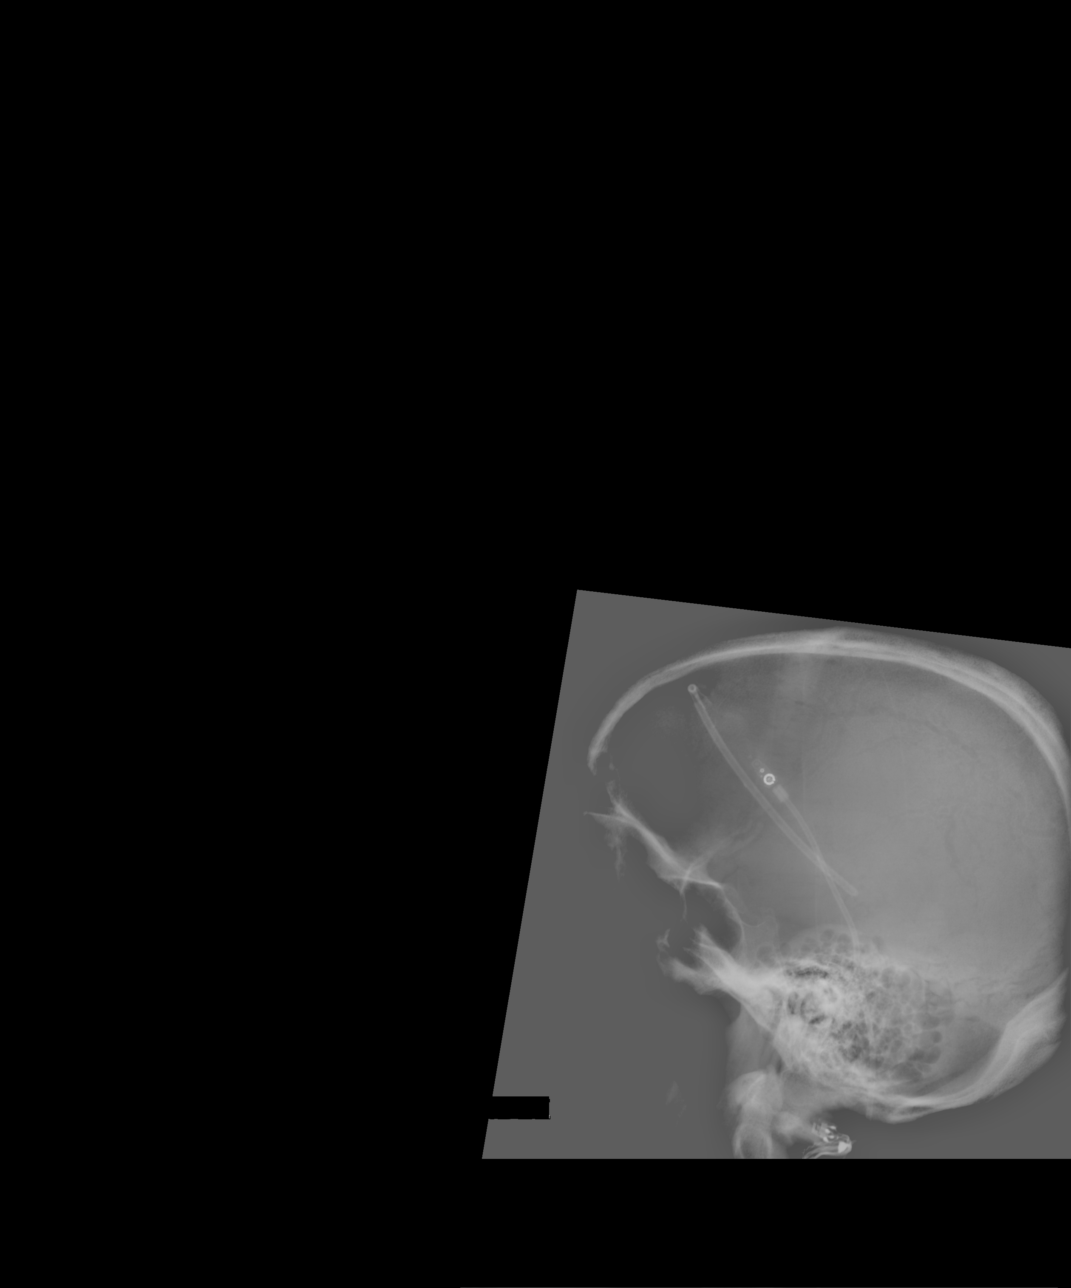

[lateral (1 of 2)]
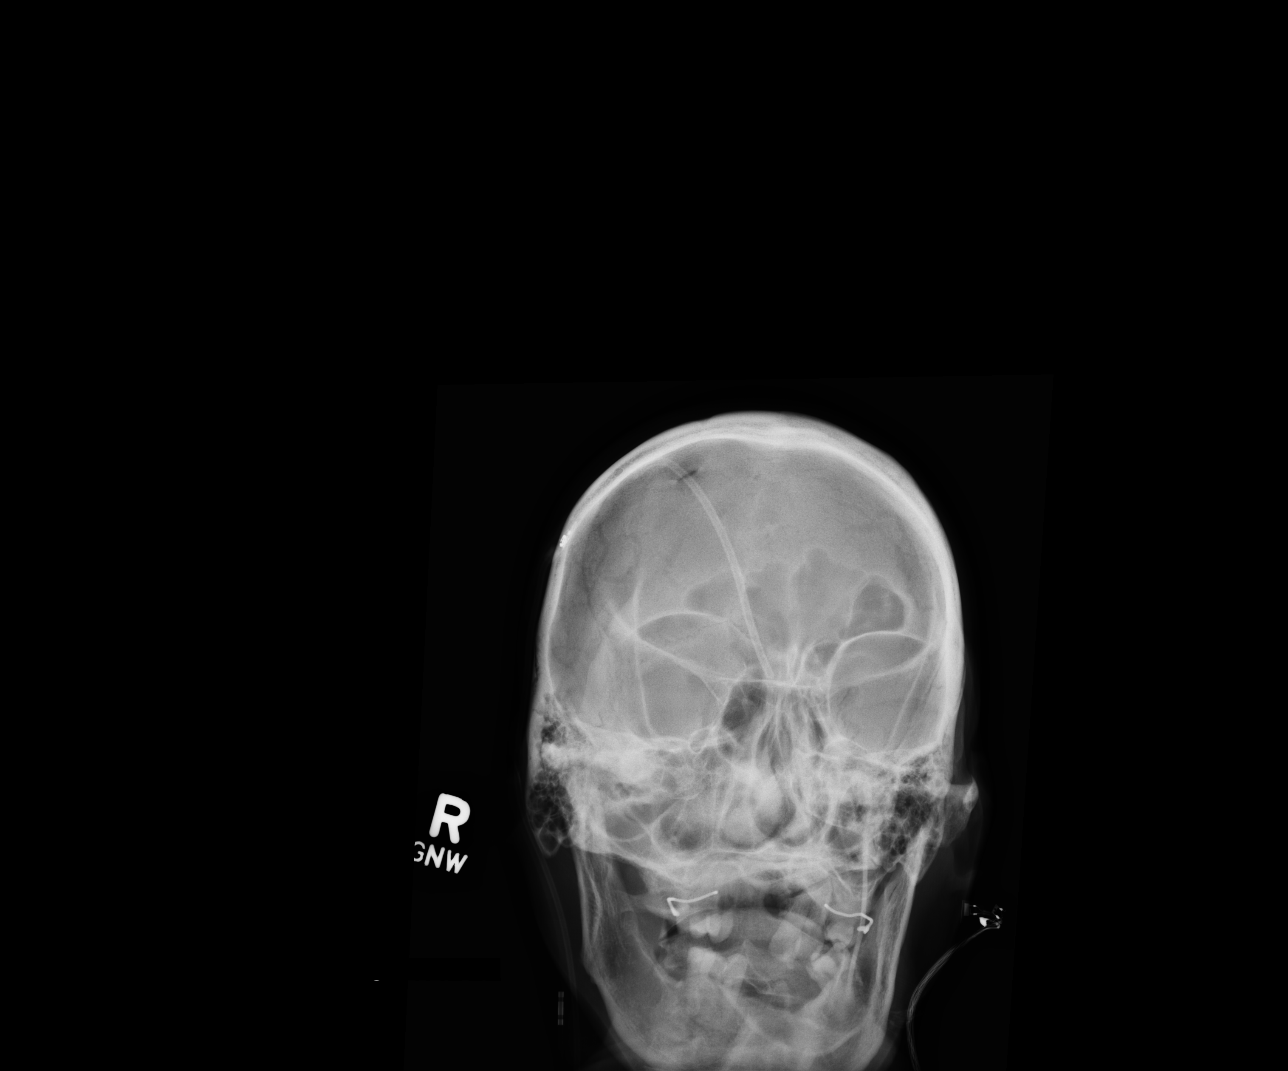

[ap [person_name] (2 of 2)]
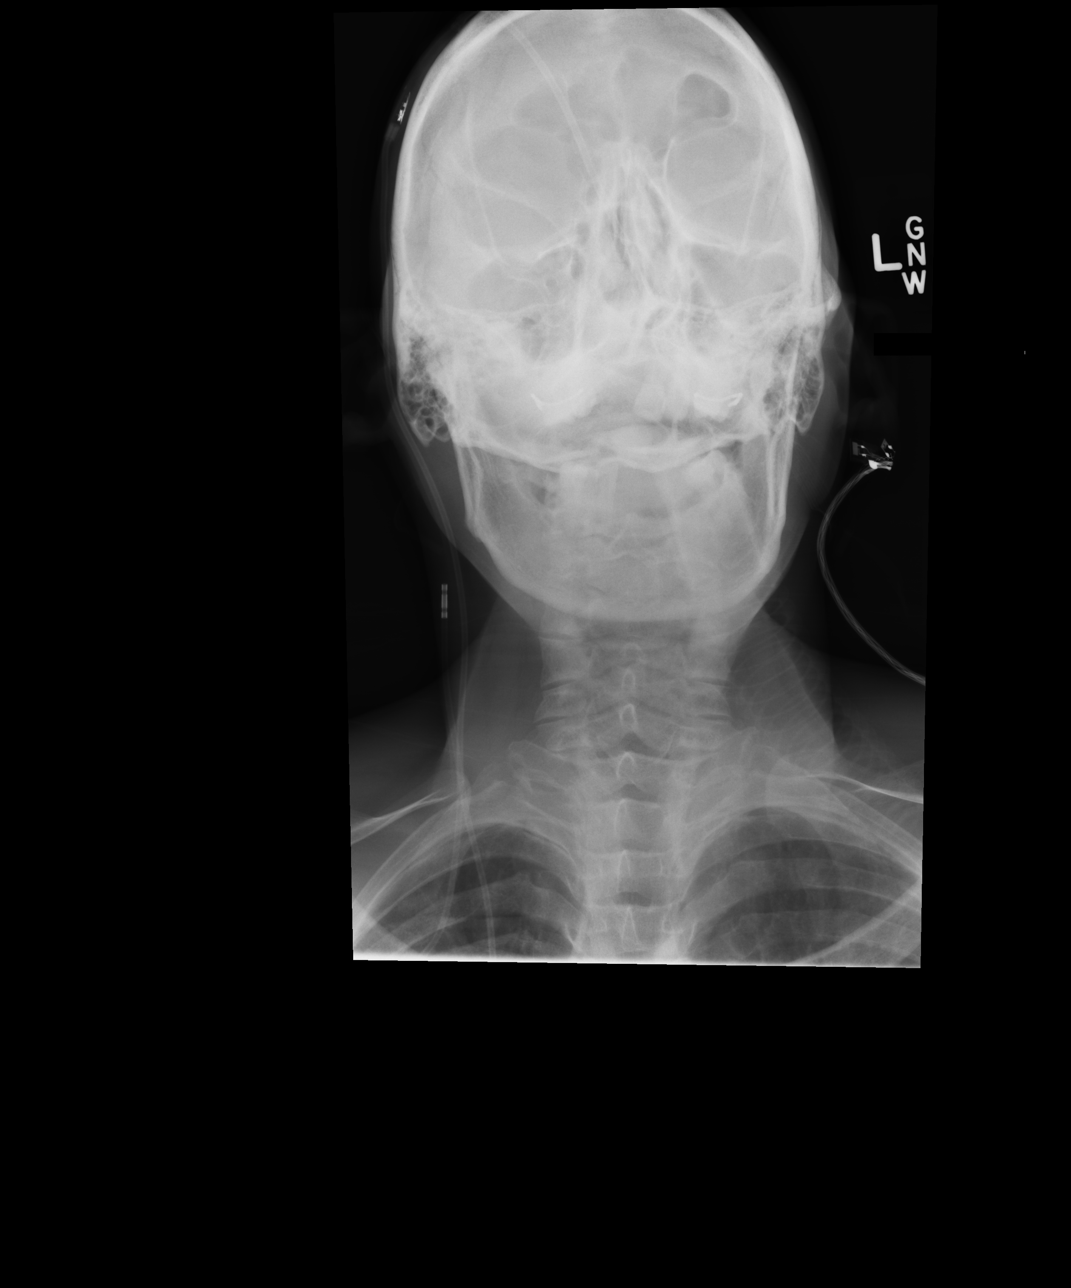

[lateral (2 of 2)]
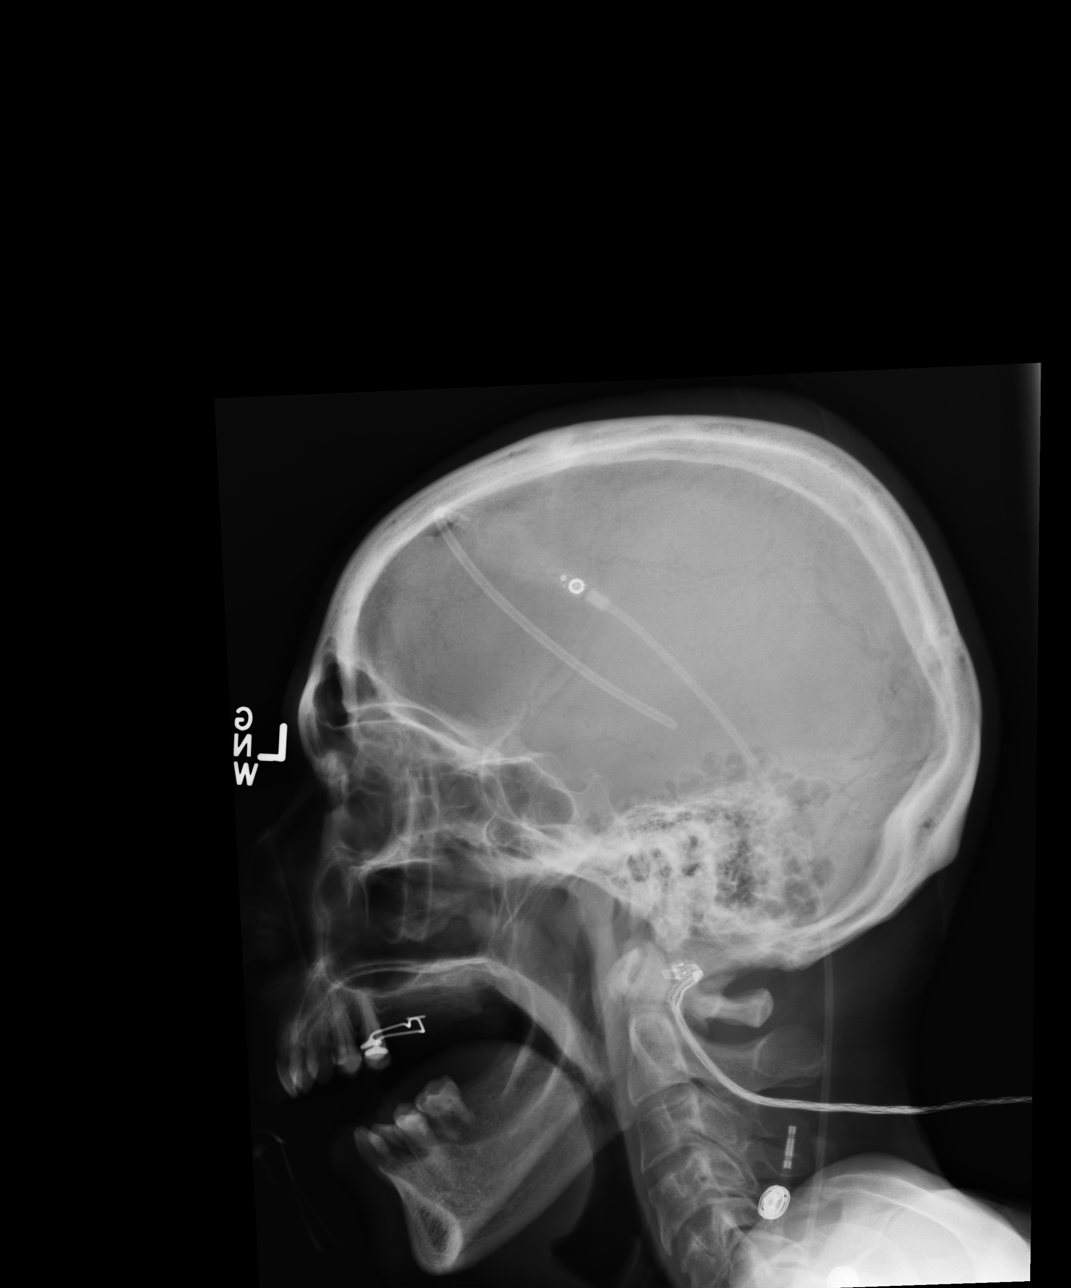

[4 of 4 positions shown; findings below may reference images not displayed]

FINDINGS: Frontal and lateral views were obtained. The shunt tip is near the
midline somewhat posterior in position. CT earlier in the day show
the tip just to the left of the dilated third ventricle. The shunt
tubing appears patent in the skull and in the visualized cervical
spine regions. No bony lesions are identified.
IMPRESSION: Shunt catheter appears grossly intact in visualized regions.

## 2014-10-02 IMAGING — CR DG CHEST 1V PORT
1 series · 1 of 1 positions shown · non-contrast
Comparison: July 26, 2013

CLINICAL DATA: Loss of consciousness

EXAM:
PORTABLE CHEST - 1 VIEW

[AP]
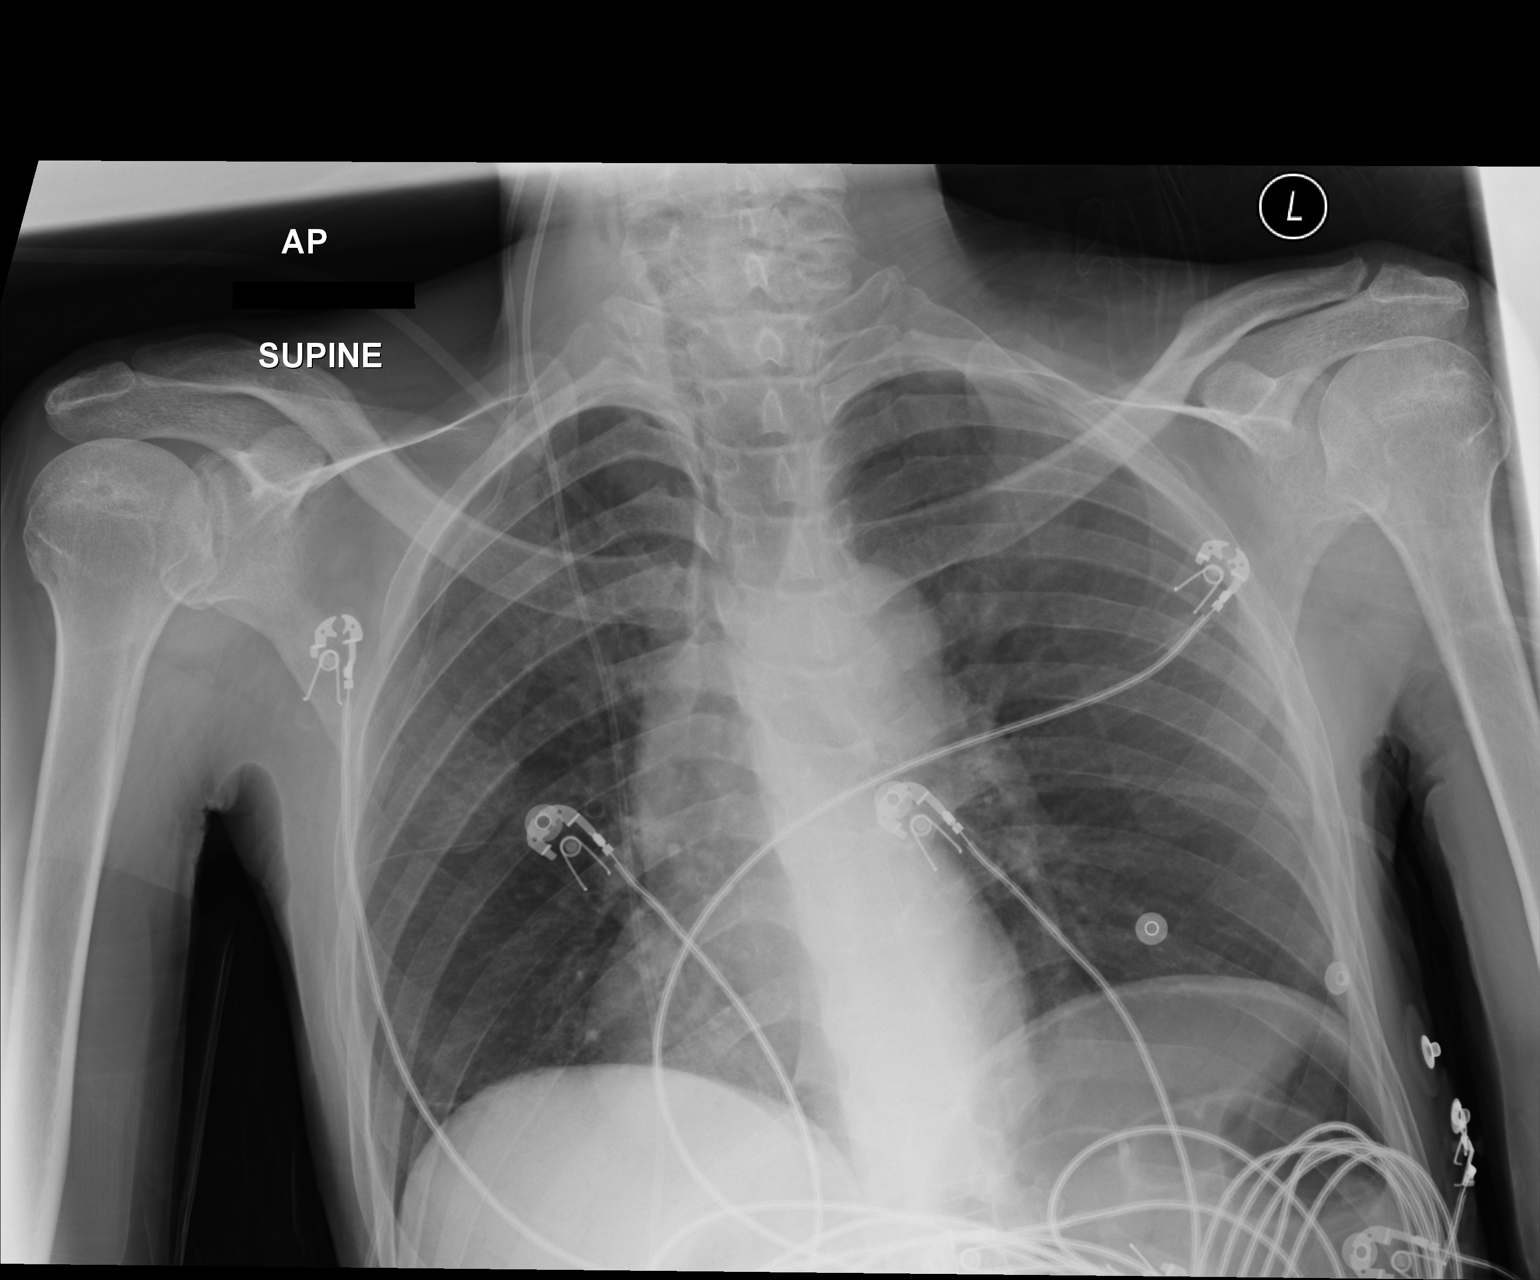

[1 of 1 positions shown; findings below may reference images not displayed]

FINDINGS: There is a shunt catheter extending along the right hemithorax.
There is no edema or consolidation. Heart size and pulmonary
vascularity are normal. No adenopathy. No pneumothorax. No bone
lesions.
IMPRESSION: No edema or consolidation. Shunt catheter extending along the right
hemithorax.

## 2014-10-02 IMAGING — CT CT ABD-PELV W/O CM
2 of 4 series · 16 of 46 positions shown, 18 images · non-contrast
Comparison: SP REPLC GASTRO/COLONIC TUBE PERCUT W/FLUORO dated
02/10/2013

CLINICAL DATA: abd pain, hypotension,

EXAM:
CT ABDOMEN AND PELVIS WITHOUT CONTRAST
TECHNIQUE: Multidetector CT imaging of the abdomen and pelvis was performed
following the standard protocol without intravenous contrast.

[Series 3: abd/ pelvis 5.0 i30f 1 · axial · 0.67mm/px · z∈[-792,-412]mm · 13 of 84 slices shown, 15 images]
[im 4/84  soft-tissue]
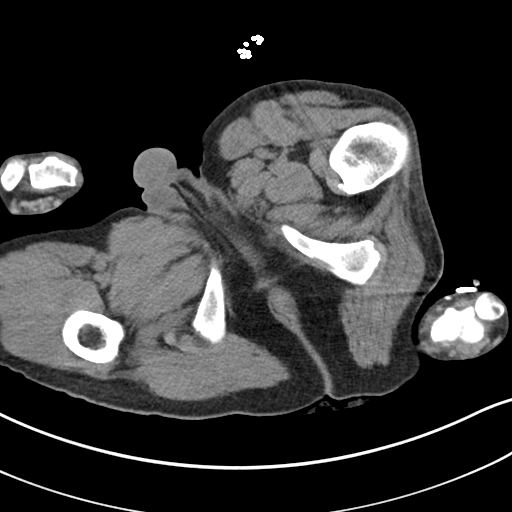
[im 4/84  bone]
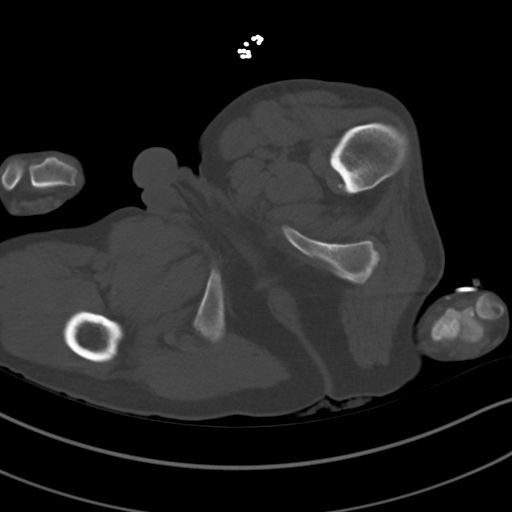
[im 10/84  soft-tissue]
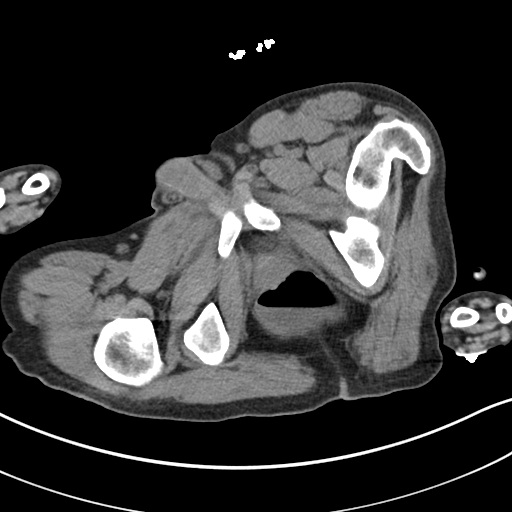
[im 17/84  soft-tissue]
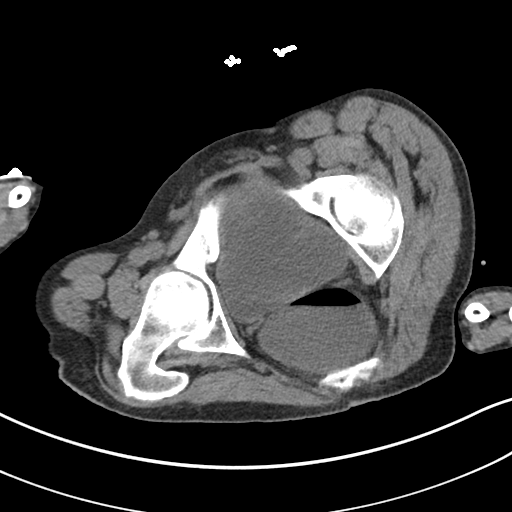
[im 24/84  soft-tissue]
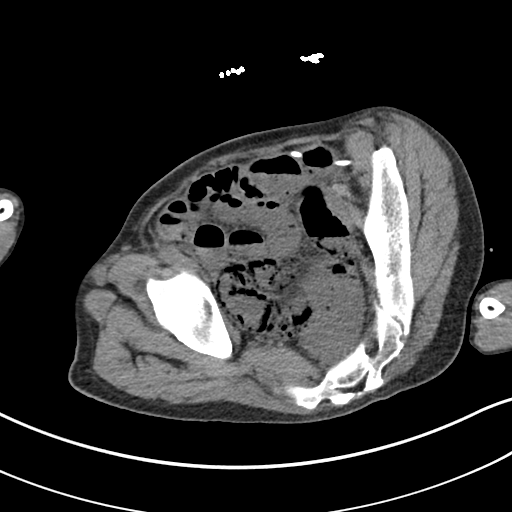
[im 30/84  soft-tissue]
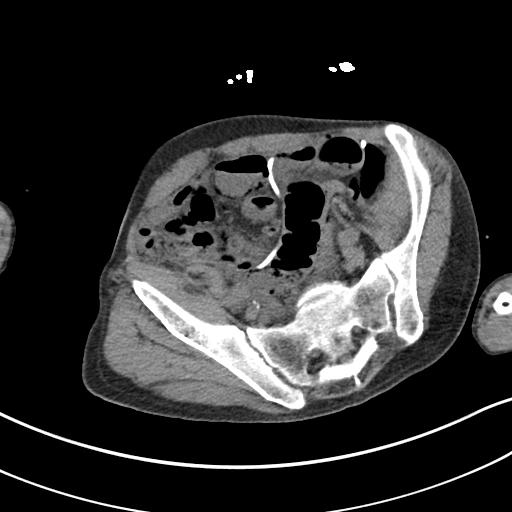
[im 37/84  soft-tissue]
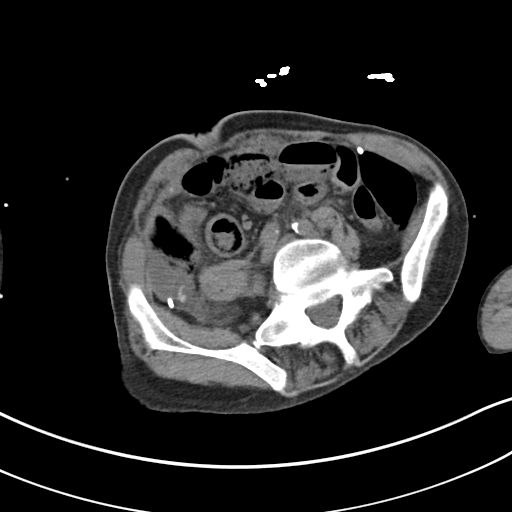
[im 44/84  soft-tissue]
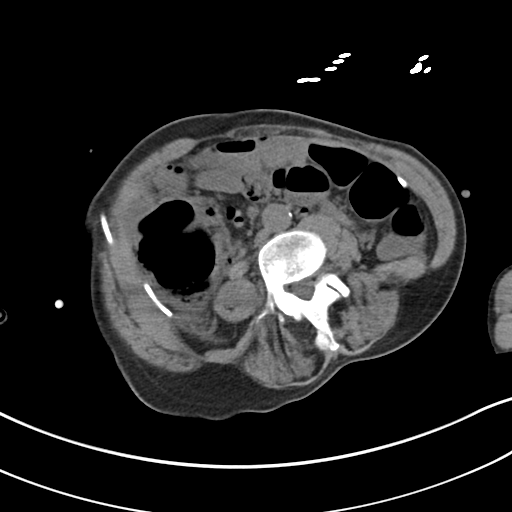
[im 47/84  soft-tissue]
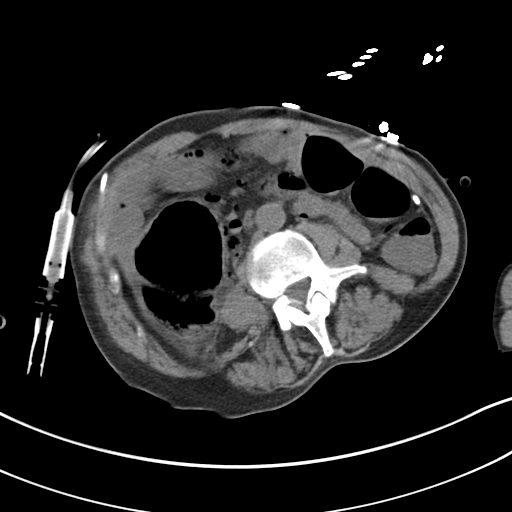
[im 54/84  soft-tissue]
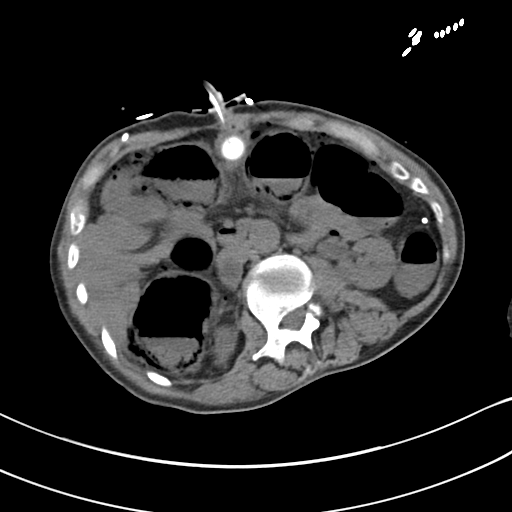
[im 54/84  bone]
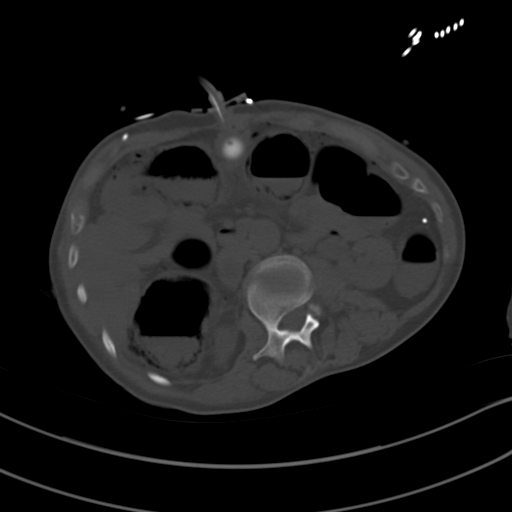
[im 60/84  soft-tissue]
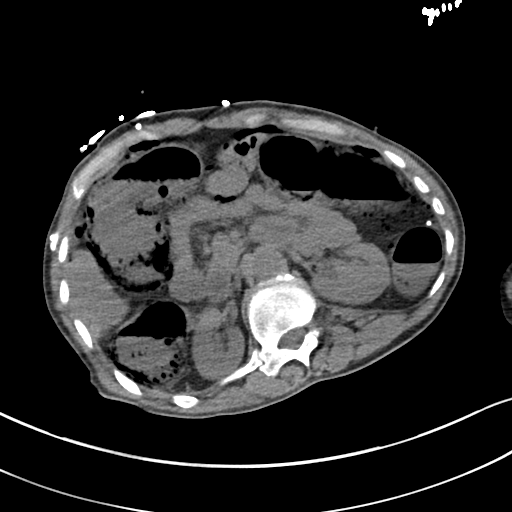
[im 67/84  soft-tissue]
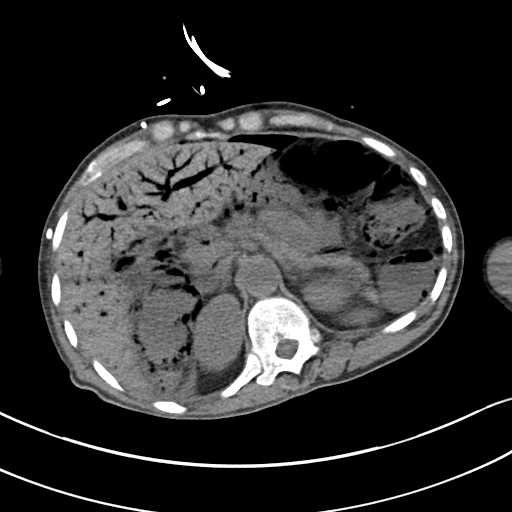
[im 74/84  soft-tissue]
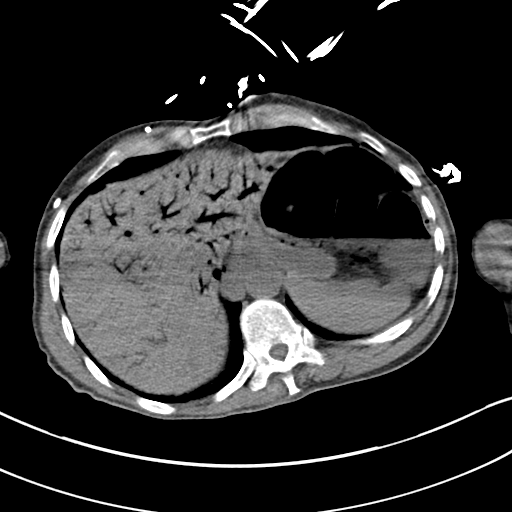
[im 80/84  soft-tissue]
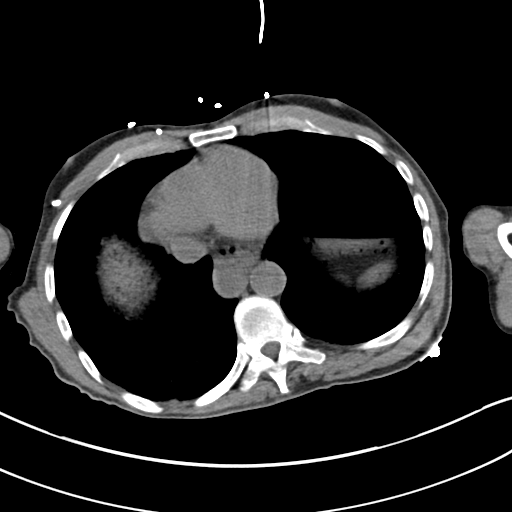

[Series 6: cor · coronal · 0.71mm/px · 3 of 108 slices shown]
[im 36/108  soft-tissue]
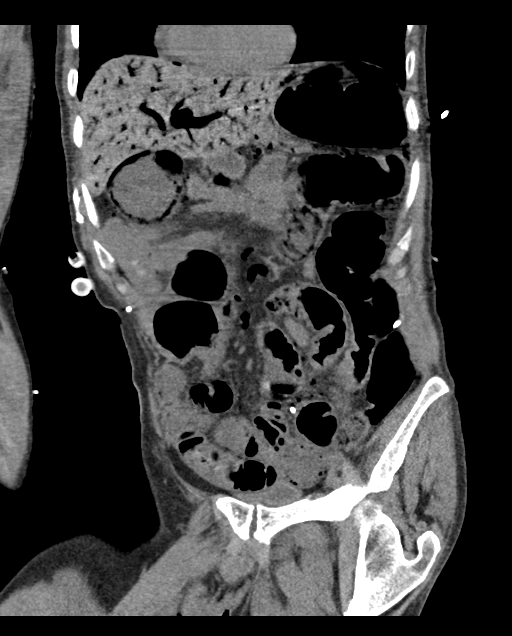
[im 48/108  soft-tissue]
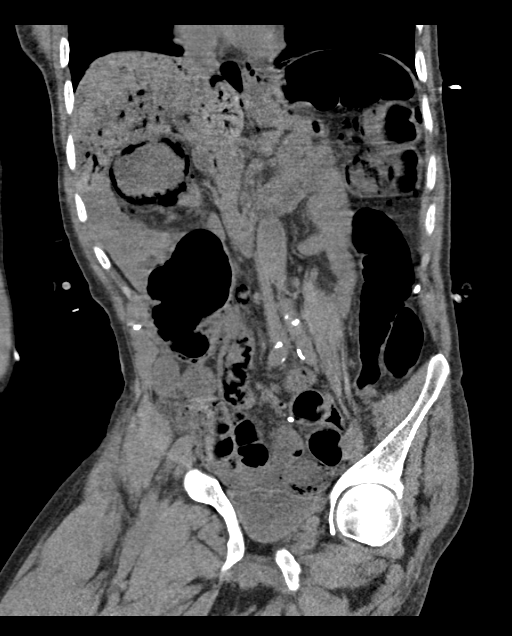
[im 60/108  soft-tissue]
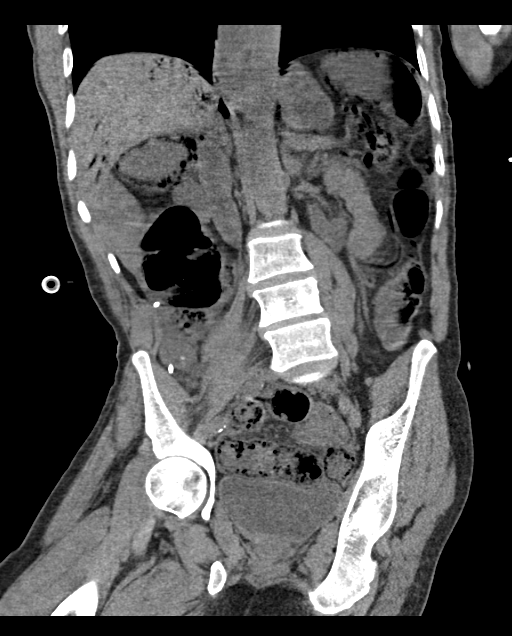

[16 of 46 positions shown; findings below may reference images not displayed]

FINDINGS: Minimal atelectasis versus scarring within the lung bases.

Severe portal venous gas is appreciated within the liver with
greatest confluence in the left lobe. Areas of pneumatosis are
identified within the ascending colon, transverse colon and in
regions of the sigmoid colon. Air is appreciated within the inferior
mesenteric vein and portions of the splenic vein. Areas of
pneumoperitoneum scattered throughout the abdomen.

The colon is air filled and distended.

No abdominal free fluid or loculated fluid collections are
appreciated. No abdominal masses nor adenopathy. There is no
evidence of abdominal aortic aneurysm.

Ventriculoperitoneal shunt is appreciated curled within the pelvis.
A percutaneous gastric feeding tube is appreciated within a
decompressed stomach. A small hiatal hernia appreciated.

No aggressive osseous appearing lesions.
IMPRESSION: 1. Severe bowel wall pneumatosis involving nearly the entire colon.
2. Large amount of portal venous gas with greatest confluence in the
right lobe of liver.
3. Critical Value/emergent results were called by telephone at the
time of interpretation on 08/27/2013 at [DATE] to Dr. ROMAN
ACE , who verbally acknowledged these results.

4. These findings are consistent with bowel necrosis infectious
versus ischemic.
5. Areas of pneumoperitoneum.
